# Patient Record
Sex: Female | Born: 1950
Health system: Southern US, Community
[De-identification: ages and names within clinical notes are randomized; demographics above are authoritative.]

## PROBLEM LIST (undated history)

## (undated) DIAGNOSIS — F419 Anxiety disorder, unspecified: Secondary | ICD-10-CM

## (undated) DIAGNOSIS — Z800 Family history of malignant neoplasm of digestive organs: Secondary | ICD-10-CM

## (undated) DIAGNOSIS — C801 Malignant (primary) neoplasm, unspecified: Secondary | ICD-10-CM

## (undated) DIAGNOSIS — Z8042 Family history of malignant neoplasm of prostate: Secondary | ICD-10-CM

## (undated) HISTORY — DX: Family history of malignant neoplasm of digestive organs: Z80.0

## (undated) HISTORY — DX: Family history of malignant neoplasm of prostate: Z80.42

## (undated) HISTORY — DX: Anxiety disorder, unspecified: F41.9

---

## 1998-04-16 ENCOUNTER — Emergency Department (HOSPITAL_COMMUNITY): Admission: EM | Admit: 1998-04-16 | Discharge: 1998-04-16 | Payer: Self-pay | Admitting: Emergency Medicine

## 1998-05-03 ENCOUNTER — Encounter: Admission: RE | Admit: 1998-05-03 | Discharge: 1998-08-01 | Payer: Self-pay | Admitting: Orthopaedic Surgery

## 2016-02-04 DIAGNOSIS — Z79891 Long term (current) use of opiate analgesic: Secondary | ICD-10-CM | POA: Diagnosis not present

## 2016-02-04 DIAGNOSIS — Z0000 Encounter for general adult medical examination without abnormal findings: Secondary | ICD-10-CM | POA: Diagnosis not present

## 2016-02-04 DIAGNOSIS — L259 Unspecified contact dermatitis, unspecified cause: Secondary | ICD-10-CM | POA: Diagnosis not present

## 2016-02-04 DIAGNOSIS — M797 Fibromyalgia: Secondary | ICD-10-CM | POA: Diagnosis not present

## 2016-02-04 DIAGNOSIS — F17200 Nicotine dependence, unspecified, uncomplicated: Secondary | ICD-10-CM | POA: Diagnosis not present

## 2016-11-29 DIAGNOSIS — Z79891 Long term (current) use of opiate analgesic: Secondary | ICD-10-CM | POA: Diagnosis not present

## 2017-03-01 DIAGNOSIS — Z79891 Long term (current) use of opiate analgesic: Secondary | ICD-10-CM | POA: Diagnosis not present

## 2017-04-11 DIAGNOSIS — Z79891 Long term (current) use of opiate analgesic: Secondary | ICD-10-CM | POA: Diagnosis not present

## 2017-05-10 ENCOUNTER — Encounter: Payer: Self-pay | Admitting: Gastroenterology

## 2017-05-17 DIAGNOSIS — R8290 Unspecified abnormal findings in urine: Secondary | ICD-10-CM | POA: Diagnosis not present

## 2017-05-17 DIAGNOSIS — R634 Abnormal weight loss: Secondary | ICD-10-CM | POA: Diagnosis not present

## 2017-05-17 DIAGNOSIS — R197 Diarrhea, unspecified: Secondary | ICD-10-CM | POA: Diagnosis not present

## 2017-05-21 DIAGNOSIS — R197 Diarrhea, unspecified: Secondary | ICD-10-CM | POA: Diagnosis not present

## 2017-05-21 DIAGNOSIS — R634 Abnormal weight loss: Secondary | ICD-10-CM | POA: Diagnosis not present

## 2017-05-21 DIAGNOSIS — R194 Change in bowel habit: Secondary | ICD-10-CM | POA: Diagnosis not present

## 2017-05-28 DIAGNOSIS — K2930 Chronic superficial gastritis without bleeding: Secondary | ICD-10-CM | POA: Diagnosis not present

## 2017-05-28 DIAGNOSIS — D126 Benign neoplasm of colon, unspecified: Secondary | ICD-10-CM | POA: Diagnosis not present

## 2017-05-28 DIAGNOSIS — K210 Gastro-esophageal reflux disease with esophagitis: Secondary | ICD-10-CM | POA: Diagnosis not present

## 2017-05-28 DIAGNOSIS — K5730 Diverticulosis of large intestine without perforation or abscess without bleeding: Secondary | ICD-10-CM | POA: Diagnosis not present

## 2017-05-28 DIAGNOSIS — K635 Polyp of colon: Secondary | ICD-10-CM | POA: Diagnosis not present

## 2017-05-28 DIAGNOSIS — K228 Other specified diseases of esophagus: Secondary | ICD-10-CM | POA: Diagnosis not present

## 2017-05-28 DIAGNOSIS — K640 First degree hemorrhoids: Secondary | ICD-10-CM | POA: Diagnosis not present

## 2017-05-28 DIAGNOSIS — R634 Abnormal weight loss: Secondary | ICD-10-CM | POA: Diagnosis not present

## 2017-05-28 DIAGNOSIS — R197 Diarrhea, unspecified: Secondary | ICD-10-CM | POA: Diagnosis not present

## 2017-05-28 DIAGNOSIS — R194 Change in bowel habit: Secondary | ICD-10-CM | POA: Diagnosis not present

## 2017-05-28 DIAGNOSIS — K2900 Acute gastritis without bleeding: Secondary | ICD-10-CM | POA: Diagnosis not present

## 2017-06-04 DIAGNOSIS — D126 Benign neoplasm of colon, unspecified: Secondary | ICD-10-CM | POA: Diagnosis not present

## 2017-06-04 DIAGNOSIS — K210 Gastro-esophageal reflux disease with esophagitis: Secondary | ICD-10-CM | POA: Diagnosis not present

## 2017-06-04 DIAGNOSIS — K635 Polyp of colon: Secondary | ICD-10-CM | POA: Diagnosis not present

## 2017-06-15 ENCOUNTER — Other Ambulatory Visit: Payer: Self-pay | Admitting: Gastroenterology

## 2017-06-15 DIAGNOSIS — R197 Diarrhea, unspecified: Secondary | ICD-10-CM

## 2017-06-15 DIAGNOSIS — R634 Abnormal weight loss: Secondary | ICD-10-CM

## 2017-06-21 ENCOUNTER — Ambulatory Visit
Admission: RE | Admit: 2017-06-21 | Discharge: 2017-06-21 | Disposition: A | Discharge status: Home / Self Care | Payer: Medicare Other | Source: Ambulatory Visit | Attending: Gastroenterology | Admitting: Gastroenterology

## 2017-06-21 DIAGNOSIS — R197 Diarrhea, unspecified: Secondary | ICD-10-CM

## 2017-06-21 DIAGNOSIS — J439 Emphysema, unspecified: Secondary | ICD-10-CM | POA: Diagnosis not present

## 2017-06-21 DIAGNOSIS — K7689 Other specified diseases of liver: Secondary | ICD-10-CM | POA: Diagnosis not present

## 2017-06-21 DIAGNOSIS — R634 Abnormal weight loss: Secondary | ICD-10-CM

## 2017-06-21 MED ORDER — IOPAMIDOL (ISOVUE-300) INJECTION 61%
100.0000 mL | Freq: Once | INTRAVENOUS | Status: AC | PRN
  Administered 2017-06-21: 100 mL via INTRAVENOUS

## 2017-06-26 DIAGNOSIS — C259 Malignant neoplasm of pancreas, unspecified: Secondary | ICD-10-CM | POA: Diagnosis not present

## 2017-06-26 DIAGNOSIS — R634 Abnormal weight loss: Secondary | ICD-10-CM | POA: Diagnosis not present

## 2017-06-26 DIAGNOSIS — R197 Diarrhea, unspecified: Secondary | ICD-10-CM | POA: Diagnosis not present

## 2017-06-26 DIAGNOSIS — R1013 Epigastric pain: Secondary | ICD-10-CM | POA: Diagnosis not present

## 2017-06-27 ENCOUNTER — Other Ambulatory Visit (HOSPITAL_COMMUNITY): Payer: Self-pay | Admitting: Gastroenterology

## 2017-06-27 DIAGNOSIS — K769 Liver disease, unspecified: Secondary | ICD-10-CM

## 2017-07-02 ENCOUNTER — Ambulatory Visit: Payer: Self-pay | Admitting: Gastroenterology

## 2017-07-03 ENCOUNTER — Encounter: Payer: Self-pay | Admitting: Hematology

## 2017-07-03 ENCOUNTER — Ambulatory Visit (HOSPITAL_BASED_OUTPATIENT_CLINIC_OR_DEPARTMENT_OTHER): Payer: Medicare Other | Admitting: Hematology

## 2017-07-03 DIAGNOSIS — C787 Secondary malignant neoplasm of liver and intrahepatic bile duct: Secondary | ICD-10-CM | POA: Diagnosis not present

## 2017-07-03 DIAGNOSIS — C259 Malignant neoplasm of pancreas, unspecified: Secondary | ICD-10-CM | POA: Diagnosis not present

## 2017-07-03 DIAGNOSIS — R197 Diarrhea, unspecified: Secondary | ICD-10-CM

## 2017-07-03 DIAGNOSIS — K8689 Other specified diseases of pancreas: Secondary | ICD-10-CM

## 2017-07-03 MED ORDER — PANCRELIPASE (LIP-PROT-AMYL) 24000-76000 UNITS PO CPEP: 1.0000 | ORAL_CAPSULE | Freq: Three times a day (TID) | ORAL | 2 refills | Status: DC

## 2017-07-03 NOTE — Progress Notes (Signed)
[place] [place]  [phone] Fax:([phone]) [place] Note   Patient Care Team: Wilford Corner, MD as PCP - General (Gastroenterology) 07/03/2017  REFERRAL PHYSICIAN: Dr. 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 female is here because of her abnormal CT findings which is highly suspicious for metastatic pancreatic malignancy.  She was referred by her gastroenterologist Dr. Michail Sermon.  She presents to my clinic with her husband today.  She has been having diarrhea for 4 months, she has loose/watery BM after each meals, no pain, nausea, or bloating. She has had low appetite since then, she has been eating very little, she was also recommended to avoid dairy products by her primary care physician, per her husband, she has been taking boost 3 bottles a day lately, she has lost aobut 35 lbs, but has been stable lately since she is taking boosts.   She was initially referred by her primary care physician, and subsequently referred to GI Dr. Michail Sermon.  She underwent EGD and colonoscopy, which showed benign polyps and colon otherwise negative.,  CT chest, abdomen and pelvis was obtained on June 21, 2017, which showed a indistinct low-attenuation mass in the uncinate process of the pancreas with dilatation of pancreatic duct multiple, low-attenuation lesions throughout the right and left lobe of liver most consistent with liver metastasis.  She was referred to IR for liver biopsy this Friday.  She is otherwise healthy, retired from post office, still works part-time as a Oceanographer.  She lives with her husband, who is disabled.  She overall has mild fatigue, but functions very well at home.  No other complaints.  MEDICAL HISTORY:  Past Medical History:  Diagnosis Date  . Anxiety     SURGICAL HISTORY: History  reviewed. No pertinent surgical history.  SOCIAL HISTORY: Social History   Socioeconomic History  . Marital status: Married    Spouse name: Not on file  . Number of children: Not on file  . Years of education: Not on file  . Highest education level: Not on file  Social Needs  . Financial resource strain: Not on file  . Food insecurity - worry: Not on file  . Food insecurity - inability: Not on file  . Transportation needs - medical: Not on file  . Transportation needs - non-medical: Not on file  Occupational History  . Not on file  Tobacco Use  . Smoking status: Current Every Day Smoker    Packs/day: 0.50    Years: 50.00    Pack years: 25.00  . Smokeless tobacco: Never Used  Substance and Sexual Activity  . Alcohol use: No    Frequency: Never  . Drug use: Yes    Types: Marijuana  . Sexual activity: Not on file  Other Topics Concern  . Not on file  Social History Narrative  . Not on file    FAMILY HISTORY: Family History  Problem Relation Age of Onset  . Cancer Father        prostate cancer   . Cancer Maternal Aunt 70       pancreatic cancer   . Cancer Maternal Grandmother        colon cancer     ALLERGIES:  has No Known Allergies.  MEDICATIONS:  Current Outpatient Medications  Medication Sig Dispense Refill  . LORazepam (ATIVAN) 1 MG tablet Take 1 mg 2 (two) times  daily by mouth.    . Multiple Vitamin (MULTIVITAMIN WITH MINERALS) TABS tablet Take 1 tablet daily by mouth. One-A-Day for Women     No current facility-administered medications for this visit.     REVIEW OF SYSTEMS:   Constitutional: Denies fevers, chills or abnormal night sweats, (+) 35 pound weight loss in the past 4 months Eyes: Denies blurriness of vision, double vision or watery eyes Ears, nose, mouth, throat, and face: Denies mucositis or sore throat Respiratory: Denies cough, dyspnea or wheezes Cardiovascular: Denies palpitation, chest discomfort or lower extremity  swelling Gastrointestinal:  Denies nausea, heartburn, (+) diarrhea  Skin: Denies abnormal skin rashes Lymphatics: Denies new lymphadenopathy or easy bruising Neurological:Denies numbness, tingling or new weaknesses Behavioral/Psych: Mood is stable, no new changes  All other systems were reviewed with the patient and are negative.  PHYSICAL EXAMINATION: ECOG PERFORMANCE STATUS: 1 - Symptomatic but completely ambulatory  Vitals:   07/03/17 1342  BP: 104/63  Pulse: 69  Resp: 18  Temp: (!) 97.5 F (36.4 C)  SpO2: 100%   Filed Weights   07/03/17 1342  Weight: 93 lb 12.8 oz (42.5 kg)    GENERAL:alert, no distress and comfortable SKIN: skin color, texture, turgor are normal, no rashes or significant lesions EYES: normal, conjunctiva are pink and non-injected, sclera clear OROPHARYNX:no exudate, no erythema and lips, buccal mucosa, and tongue normal  NECK: supple, thyroid normal size, non-tender, without nodularity LYMPH:  no palpable lymphadenopathy in the cervical, axillary or inguinal LUNGS: clear to auscultation and percussion with normal breathing effort HEART: regular rate & rhythm and no murmurs and no lower extremity edema ABDOMEN:abdomen soft, non-tender and normal bowel sounds Musculoskeletal:no cyanosis of digits and no clubbing  PSYCH: alert & oriented x 3 with fluent speech NEURO: no focal motor/sensory deficits  LABORATORY DATA:  I have reviewed the data as listed No flowsheet data found.  Her outside lab from June 26, 2017 CBC: WBC 8.7, hemoglobin 13.5, MCV 93.9, platelet 221K CMP: Within normal limits except glucose 114, potassium 3.4.  Creatinine 0.77, total bilirubin 0.6, AST 16, ALT 12 CA19.9: 1   PATHOLOGY: #1 biopsy from duodenum, distal stomach, GE junction no significant pathological changes, except reactive changes in stomach #2 transverse colon, hepatic flexure, sigmoid, polyps biopsy showed tubular adenomas (3)  RADIOGRAPHIC STUDIES: I have  personally reviewed the radiological images as listed and agreed with the findings in the report. Ct Chest W Contrast  Result Date: 06/21/2017 CLINICAL DATA:  Weight loss of 35 pounds over 6 months, loss of appetite, diarrhea for several months EXAM: CT CHEST, ABDOMEN, AND PELVIS WITH CONTRAST TECHNIQUE: Multidetector CT imaging of the chest, abdomen and pelvis was performed following the standard protocol during bolus administration of intravenous contrast. CONTRAST:  192mL ISOVUE-300 IOPAMIDOL (ISOVUE-300) INJECTION 61% COMPARISON:  Chest x-ray of 05/17/2017 Creatinine was obtained on site at [facility] at [address] Results: Creatinine 0.9 mg/dL.  GFR of 88. FINDINGS: CT CHEST FINDINGS Cardiovascular: No significant coronary artery calcifications are seen. The heart is mildly enlarged. No pericardial effusion is noted. The mid ascending thoracic aorta measures 29 mm in diameter. The pulmonary arteries are faintly visualized proximally with no significant abnormality centrally. Mediastinum/Nodes: No mediastinal or hilar adenopathy is noted. The thyroid gland contains only small low-attenuation nodules of questionable significance. Lungs/Pleura: On lung window images there are changes of centrilobular and paraseptal emphysema noted. No suspicious lung nodule is seen. No parenchymal infiltrate is seen and there is no evidence of pleural  effusion. The central airway is patent. Musculoskeletal: The thoracic vertebrae are in normal alignment with only mild degenerative change in the lower thoracic spine. CT ABDOMEN PELVIS FINDINGS Hepatobiliary: There are multiple rounded low-attenuation structures scattered throughout both the right and left lobes of liver. The multitude these lesions and varying sizes is very worrisome for diffuse metastatic involvement of the liver. MRI is recommended to assess further. No intrahepatic ductal dilatation is seen. Pancreas: There is a low-attenuation lesion  involving the uncinate process of the pancreas, and the pancreatic duct is significantly dilated distally. This lesion encases the superior mesenteric artery which is patent. Spleen: The spleen is unremarkable. Adrenals/Urinary Tract: The adrenal glands are symmetrical and normal. The kidneys enhance and there are bilateral renal cysts present right greater than left. No calculi are seen and there is no evidence of solid renal mass. No hydronephrosis is noted. On delayed images, the pelvocaliceal systems are unremarkable. The ureters are normal in caliber. The urinary bladder is not well distended but no gross abnormality is seen. Stomach/Bowel: The stomach is largely decompressed. No small bowel distention is seen. There is feces throughout the colon but no colonic lesion is noted. The terminal ileum and appendix are unremarkable. Vascular/Lymphatic: No abdominal aortic aneurysm is seen. There is moderate change of abdominal aortic atherosclerosis noted. No definite adenopathy is seen. Reproductive: The uterus is normal in size. No adnexal lesion is seen. there may be a tiny amount fluid within the pelvis. Other: None Musculoskeletal: The lumbar vertebrae are in normal alignment. No compression deformity is seen. IMPRESSION: 1. Indistinct low-attenuation mass in the uncinate process of the pancreas with dilatation of remainder of the pancreatic duct and some encasement of the SMA. These findings are highly indicative of pancreatic carcinoma. Recommend MRI to assess further. 2. Multiple low-attenuation lesions throughout the right and left lobes of liver most consistent with liver metastasis again MRI may be helpful. 3. Moderate change of abdominal aortic atherosclerosis. 4. Changes of centrilobular and paraseptal emphysema on CT of the chest. No lung lesion is seen. Electronically Signed   By: Ivar Drape M.D.   On: 06/21/2017 10:15   Ct Abdomen Pelvis W Contrast  Result Date: 06/21/2017 CLINICAL DATA:  Weight  loss of 35 pounds over 6 months, loss of appetite, diarrhea for several months EXAM: CT CHEST, ABDOMEN, AND PELVIS WITH CONTRAST TECHNIQUE: Multidetector CT imaging of the chest, abdomen and pelvis was performed following the standard protocol during bolus administration of intravenous contrast. CONTRAST:  176mL ISOVUE-300 IOPAMIDOL (ISOVUE-300) INJECTION 61% COMPARISON:  Chest x-ray of 05/17/2017 Creatinine was obtained on site at [facility] at [address] Results: Creatinine 0.9 mg/dL.  GFR of 88. FINDINGS: CT CHEST FINDINGS Cardiovascular: No significant coronary artery calcifications are seen. The heart is mildly enlarged. No pericardial effusion is noted. The mid ascending thoracic aorta measures 29 mm in diameter. The pulmonary arteries are faintly visualized proximally with no significant abnormality centrally. Mediastinum/Nodes: No mediastinal or hilar adenopathy is noted. The thyroid gland contains only small low-attenuation nodules of questionable significance. Lungs/Pleura: On lung window images there are changes of centrilobular and paraseptal emphysema noted. No suspicious lung nodule is seen. No parenchymal infiltrate is seen and there is no evidence of pleural effusion. The central airway is patent. Musculoskeletal: The thoracic vertebrae are in normal alignment with only mild degenerative change in the lower thoracic spine. CT ABDOMEN PELVIS FINDINGS Hepatobiliary: There are multiple rounded low-attenuation structures scattered throughout both the right and left lobes of  liver. The multitude these lesions and varying sizes is very worrisome for diffuse metastatic involvement of the liver. MRI is recommended to assess further. No intrahepatic ductal dilatation is seen. Pancreas: There is a low-attenuation lesion involving the uncinate process of the pancreas, and the pancreatic duct is significantly dilated distally. This lesion encases the superior mesenteric artery which is patent.  Spleen: The spleen is unremarkable. Adrenals/Urinary Tract: The adrenal glands are symmetrical and normal. The kidneys enhance and there are bilateral renal cysts present right greater than left. No calculi are seen and there is no evidence of solid renal mass. No hydronephrosis is noted. On delayed images, the pelvocaliceal systems are unremarkable. The ureters are normal in caliber. The urinary bladder is not well distended but no gross abnormality is seen. Stomach/Bowel: The stomach is largely decompressed. No small bowel distention is seen. There is feces throughout the colon but no colonic lesion is noted. The terminal ileum and appendix are unremarkable. Vascular/Lymphatic: No abdominal aortic aneurysm is seen. There is moderate change of abdominal aortic atherosclerosis noted. No definite adenopathy is seen. Reproductive: The uterus is normal in size. No adnexal lesion is seen. there may be a tiny amount fluid within the pelvis. Other: None Musculoskeletal: The lumbar vertebrae are in normal alignment. No compression deformity is seen. IMPRESSION: 1. Indistinct low-attenuation mass in the uncinate process of the pancreas with dilatation of remainder of the pancreatic duct and some encasement of the SMA. These findings are highly indicative of pancreatic carcinoma. Recommend MRI to assess further. 2. Multiple low-attenuation lesions throughout the right and left lobes of liver most consistent with liver metastasis again MRI may be helpful. 3. Moderate change of abdominal aortic atherosclerosis. 4. Changes of centrilobular and paraseptal emphysema on CT of the chest. No lung lesion is seen. Electronically Signed   By: Ivar Drape M.D.   On: 06/21/2017 10:15   EGD: 05/28/2017: Impression:  -Normal esophagus- -Z line irregular, 40 cm from the incisors, biopsied. -Acute gastritis, biopsied, -Normal examined duodenum, biopsied.  Colonoscopy: March 28, 2017 Impression -Multiple polyps, one 7 mm at the  hepatic flexure, one 3 mm at flexure, one 5 mm in the transverse colon, one 4 mm in the sigmoid colon, one 14 mm in the sigmoid colon, all removed -Normal mucosa in the entire examined colon -Diverticulosis in the sigmoid colon -Internal hemorrhoids   ASSESSMENT & PLAN: 66 year old African-American female, with past medical history of anxiety, otherwise healthy, presented with chronic diarrhea for 4 months, poor appetite and 35lbs weight loss.  CT scan showed a pancreatic head mass and multiple liver metastasis.  1. 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 she has diffuse liver metastases in both lobes. -He is scheduled for liver biopsy by interventional radiology later this week -From the image findings, and statistically, paraparetic adenocarcinoma is likely however her CA 19.9 was.  Normal, she does not have chronic diarrhea, no flushing, metastatic pancreatic neuroendocrine tumor is also possible.  We reviewed the natural history of this metastatic pancreatic malignancy and treatment options. -We discussed her malignancy is not curable at this stage, but treatable. -I plan to see her back next week to discuss her biopsy findings, and finalize her treatment plan. -She had many questions, I answered to her satisfaction's.  2. Diarrhea  -Probable  related to her pancreatic malignancy -I will call in creon to her pharmacy   3.  Anorexia and weight loss -She has been drinking boost 3 bottles a day, which has been stabilized lately -She has used marijuana in the past, still has some at home, she will try marijuana for her anorexia.  Plan  -She is scheduled for liver biopsy by interventional radiology later this week -She is agreeable to have a port placement  if the biopsy shows adenocarcinoma. Will schedule through IR -I will see her next week to discuss her biopsy results and finalize her treatment plan. -Per patient's request, I will call her brother next week when we have the diagnosis.  No orders of the defined types were placed in this encounter.   All questions were answered. The patient knows to call the clinic with any problems, questions or concerns. I spent 55 minutes counseling the patient face to face. The total time spent in the appointment was 60 minutes and more than 50% was on counseling.     Truitt Merle, MD 07/03/2017 3:23 PM

## 2017-07-04 ENCOUNTER — Telehealth: Payer: Self-pay | Admitting: Hematology

## 2017-07-04 NOTE — Progress Notes (Signed)
  Oncology Nurse Navigator Documentation  Navigator Location: [facility] (07/04/17 1308) Referral date to RadOnc/MedOnc: 06/27/17 (07/04/17 0941) )Navigator Encounter Type: Introductory phone call (07/04/17 0941)   Abnormal Finding Date: 06/21/17 (07/04/17 0941)                   Treatment Phase: Abnormal Scans (07/04/17 0941) Barriers/Navigation Needs: Education (07/04/17 0941) Education: Understanding Cancer/ Treatment Options;Coping with Diagnosis/ Prognosis;Newly Diagnosed Cancer Education (07/04/17 6578) Interventions: Education;Psycho-social support (07/04/17 0941)  I called patient to introduce myself and my role as GI navigator. Patient was unavailable but Tammy Adams and I spoke at length about what was discussed at their appointment with Dr. Burr Medico on 07/03/17. Patient shared that it has been overwhelming to hear the news of how serious his wife's diagnosis is. Patient asked if his wife could be "cured". I reinforced what Dr. Burr Medico had documented in her notes about treatable but not curable. Tammy Adams shared that he wanted to start "looking things up on the Internet." I let patient know that I will have a packet of information pertinent to his wife's treatment'diagnosis for them at his next appointment and that they can call me at any time with questions or concerns.     Education Method: Verbal (07/04/17 4696)      Acuity: Level 1 (07/04/17 0941) Acuity Level 1: Initial guidance, education and coordination as needed (07/04/17 0941)       Time Spent with Patient: 30 (07/04/17 0941)

## 2017-07-04 NOTE — Telephone Encounter (Signed)
Spoke with spouse re 11/21 f/u.

## 2017-07-04 NOTE — Progress Notes (Signed)
  Oncology Nurse Navigator Documentation  Navigator Location: [facility] (07/04/17 1528)   )Navigator Encounter Type: Telephone (07/04/17 1528) Telephone: Outgoing Call (07/04/17 1528)                           Interventions: Coordination of Care (07/04/17 1528)   Coordination of Care: Appts (07/04/17 1528)    I called and spoke directly with patient to introduce myself and my role as the Gi Navigator. I reviewed patient's upcoming appointments and let her know that her F/U with Dr. Burr Medico is scheduled for 07/11/17 @ 3:30 PM.     Acuity Level 1: Initial guidance, education and coordination as needed (07/04/17 1528)       Time Spent with Patient: 15 (07/04/17 1528)

## 2017-07-05 ENCOUNTER — Other Ambulatory Visit: Payer: Self-pay | Admitting: Radiology

## 2017-07-05 ENCOUNTER — Other Ambulatory Visit: Payer: Self-pay | Admitting: Student

## 2017-07-05 NOTE — Progress Notes (Signed)
  Oncology Nurse Navigator Documentation  Navigator Location: [facility] (07/05/17 1405)   )Navigator Encounter Type: Telephone (07/05/17 1405) Telephone: Incoming Call (07/05/17 1405)                         Education: Other(Understanding medication administration) (07/05/17 1405) Interventions: Education (07/05/17 1405)  Patient called to ask how she should take her Creon. Patient instructed to speak with her pharmacist when she picks up the Creon and if she has further questions she can call back and we can find further answers.  I also spoke with Mr. Schwenke to assure him that we are only a phone call away if he has questions.          Acuity: Level 2 (07/05/17 1405)   Acuity Level 2: Ongoing guidance and education throughout treatment as needed;Educational needs (07/05/17 1405)     Time Spent with Patient: 15 (07/05/17 1405)

## 2017-07-06 ENCOUNTER — Ambulatory Visit (HOSPITAL_COMMUNITY)
Admission: RE | Admit: 2017-07-06 | Discharge: 2017-07-06 | Disposition: A | Discharge status: Home / Self Care | Payer: Medicare Other | Source: Ambulatory Visit | Attending: Gastroenterology | Admitting: Gastroenterology

## 2017-07-06 ENCOUNTER — Other Ambulatory Visit (HOSPITAL_COMMUNITY)
Admission: RE | Admit: 2017-07-06 | Discharge: 2017-07-06 | Disposition: A | Discharge status: Home / Self Care | Payer: Medicare Other | Source: Ambulatory Visit | Attending: Hematology | Admitting: Hematology

## 2017-07-06 ENCOUNTER — Encounter (HOSPITAL_COMMUNITY): Payer: Self-pay

## 2017-07-06 DIAGNOSIS — K769 Liver disease, unspecified: Secondary | ICD-10-CM | POA: Insufficient documentation

## 2017-07-06 DIAGNOSIS — F17210 Nicotine dependence, cigarettes, uncomplicated: Secondary | ICD-10-CM | POA: Insufficient documentation

## 2017-07-06 DIAGNOSIS — N281 Cyst of kidney, acquired: Secondary | ICD-10-CM | POA: Diagnosis not present

## 2017-07-06 DIAGNOSIS — K7689 Other specified diseases of liver: Secondary | ICD-10-CM | POA: Diagnosis not present

## 2017-07-06 DIAGNOSIS — C787 Secondary malignant neoplasm of liver and intrahepatic bile duct: Secondary | ICD-10-CM | POA: Insufficient documentation

## 2017-07-06 DIAGNOSIS — I700 Atherosclerosis of aorta: Secondary | ICD-10-CM | POA: Insufficient documentation

## 2017-07-06 DIAGNOSIS — Z79899 Other long term (current) drug therapy: Secondary | ICD-10-CM | POA: Diagnosis not present

## 2017-07-06 DIAGNOSIS — R634 Abnormal weight loss: Secondary | ICD-10-CM | POA: Insufficient documentation

## 2017-07-06 DIAGNOSIS — F419 Anxiety disorder, unspecified: Secondary | ICD-10-CM | POA: Insufficient documentation

## 2017-07-06 DIAGNOSIS — C801 Malignant (primary) neoplasm, unspecified: Secondary | ICD-10-CM | POA: Diagnosis not present

## 2017-07-06 DIAGNOSIS — J432 Centrilobular emphysema: Secondary | ICD-10-CM | POA: Insufficient documentation

## 2017-07-06 DIAGNOSIS — C259 Malignant neoplasm of pancreas, unspecified: Secondary | ICD-10-CM | POA: Insufficient documentation

## 2017-07-06 LAB — APTT: APTT: 27 s (ref 24–36)

## 2017-07-06 LAB — CBC
HEMATOCRIT: 37.4 % (ref 36.0–46.0)
HEMOGLOBIN: 12.9 g/dL (ref 12.0–15.0)
MCH: 31.8 pg (ref 26.0–34.0)
MCHC: 34.5 g/dL (ref 30.0–36.0)
MCV: 92.1 fL (ref 78.0–100.0)
Platelets: 204 10*3/uL (ref 150–400)
RBC: 4.06 MIL/uL (ref 3.87–5.11)
RDW: 13.4 % (ref 11.5–15.5)
WBC: 8.3 10*3/uL (ref 4.0–10.5)

## 2017-07-06 LAB — PROTIME-INR
INR: 1.05
Prothrombin Time: 13.6 seconds (ref 11.4–15.2)

## 2017-07-06 MED ORDER — GELATIN ABSORBABLE 12-7 MM EX MISC
CUTANEOUS | Status: AC
  Filled 2017-07-06: qty 1

## 2017-07-06 MED ORDER — SODIUM CHLORIDE 0.9 % IV SOLN: INTRAVENOUS | Status: DC

## 2017-07-06 MED ORDER — MIDAZOLAM HCL 2 MG/2ML IJ SOLN
INTRAMUSCULAR | Status: AC
  Filled 2017-07-06: qty 2

## 2017-07-06 MED ORDER — FENTANYL CITRATE (PF) 100 MCG/2ML IJ SOLN
INTRAMUSCULAR | Status: AC | PRN
  Administered 2017-07-06 (×2): 25 ug via INTRAVENOUS

## 2017-07-06 MED ORDER — FENTANYL CITRATE (PF) 100 MCG/2ML IJ SOLN
INTRAMUSCULAR | Status: AC
  Filled 2017-07-06: qty 2

## 2017-07-06 MED ORDER — LIDOCAINE-EPINEPHRINE 1 %-1:100000 IJ SOLN
INTRAMUSCULAR | Status: AC
  Filled 2017-07-06: qty 1

## 2017-07-06 MED ORDER — MIDAZOLAM HCL 2 MG/2ML IJ SOLN
INTRAMUSCULAR | Status: AC | PRN
  Administered 2017-07-06: 1 mg via INTRAVENOUS

## 2017-07-06 NOTE — Discharge Instructions (Signed)
Liver Biopsy °The liver is a large organ in the upper right-hand side of your abdomen. A liver biopsy is a procedure in which a tissue sample is taken from the liver and examined under a microscope. The procedure is done to confirm a suspected problem. °There are three types of liver biopsies: °· Percutaneous. In this type, an incision is made in your abdomen. The sample is removed through the incision with a needle. °· Laparoscopic. In this type, several incisions are made in the abdomen. A tiny camera is passed through one of the incisions to help guide the health care provider. The sample is removed through the other incision or incisions. °· Transjugular. In this type, an incision is made in the neck. A tube is passed through the incision to the liver. The sample is removed through the tube with a needle. ° °Tell a health care provider about: °· Any allergies you have. °· All medicines you are taking, including vitamins, herbs, eye drops, creams, and over-the-counter medicines. °· Any problems you or family members have had with anesthetic medicines. °· Any blood disorders you have. °· Any surgeries you have had. °· Any medical conditions you have. °· Possibility of pregnancy, if this applies. °What are the risks? °Generally, this is a safe procedure. However, problems can occur and include: °· Bleeding. °· Infection. °· Bruising. °· Collapsed lung. °· Leak of digestive juices (bile) from the liver or gallbladder. °· Problems with heart rhythm. °· Pain at the biopsy site or in the right shoulder. °· Low blood pressure (hypotension). °· Injury to nearby organs or tissues. ° °What happens before the procedure? °· Your health care provider may do some blood or urine tests. These will help your health care provider learn how well your kidneys and liver are working and how well your blood clots. °· Ask your health care provider if you will be able to go home the day of the procedure. Arrange for someone to take you  home and stay with you for at least 24 hours. °· Do not eat or drink anything after midnight on the night before the procedure or as directed by your health care provider. °· Ask your health care provider about: °? Changing or stopping your regular medicines. This is especially important if you are taking diabetes medicines or blood thinners. °? Taking medicines such as aspirin and ibuprofen. These medicines can thin your blood. Do not take these medicines before your procedure if your health care provider asks you not to. °What happens during the procedure? °Regardless of the type of biopsy that will be done, you will have an IV line placed. Through this line, you will receive fluids and medicine to relax you. If you will be having a laparoscopic biopsy, you may also receive medicine through this line to make you sleep during the procedure (general anesthetic). °Percutaneous Liver Biopsy °· You will positioned on your back, with your right hand over your head. °· A health care provider will locate your liver by tapping and pressing on the right side of your abdomen or with the help of an ultrasound machine or CT scan. °· An area at the bottom of your last right rib will be numbed. °· An incision will be made in the numbed area. °· The biopsy needle will be inserted into the incision. °· Several samples of liver tissue will be taken with the biopsy needle. You will be asked to hold your breath as each sample is taken. °Laparoscopic Liver   Biopsy °· You will be positioned on your back. °· Several small incisions will be made in your abdomen. °· Your doctor will pass a tiny camera through one incision. The camera will allow the liver to be viewed on a TV monitor in the operating room. °· Tools will be passed through the other incision or incisions. These tools will be used to remove samples of liver tissue. °Transjugular Liver Biopsy °· You will be positioned on your back on an X-ray table, with your head turned to  your left. °· An area on your neck just over your jugular vein will be numbed. °· An incision will be made in the numbed area. °· A tiny tube will be inserted through the incision. It will be pushed through the jugular vein to a blood vessel in the liver called the hepatic vein. °· Dye will be inserted through the tube, and X-rays will be taken. The dye will make the blood vessels in the liver light up on the X-rays. °· The biopsy needle will be pushed through the tube until it reaches the liver. °· Samples of liver tissue will be taken with the biopsy needle. °· The needle and the tube will be removed. °After the samples are obtained, the incision or incisions will be closed. °What happens after the procedure? °· You will be taken to a recovery area. °· You may have to lie on your right side for 1-2 hours. This will prevent bleeding from the biopsy site. °· Your progress will be watched. Your blood pressure, pulse, and the biopsy site will be checked often. °· You may have some pain or feel sick. If this happens, tell your health care provider. °· As you begin to feel better, you will be offered ice and beverages. °· You may be allowed to go home when the medicines have worn off and you can walk, drink, eat, and use the bathroom. °This information is not intended to replace advice given to you by your health care provider. Make sure you discuss any questions you have with your health care provider. °Document Released: 10/28/2003 Document Revised: 01/10/2016 Document Reviewed: 10/03/2013 °Elsevier Interactive Patient Education © 2018 [employer] ° °

## 2017-07-06 NOTE — Sedation Documentation (Signed)
Patient is resting comfortably. 

## 2017-07-06 NOTE — Procedures (Signed)
Pre Procedure Dx: Liver lesions, worrisome for pancreatic metastasis. Post Procedural Dx: Same  Technically successful US guided biopsy of indeterminate lesion within the right lobe of the liver.  EBL: None  No immediate complications.   Ronny Bacon, MD Pager #: [phone]

## 2017-07-06 NOTE — Sedation Documentation (Signed)
Bandaid R flank intact  

## 2017-07-06 NOTE — H&P (Signed)
Chief Complaint: Patient was seen in consultation today for liver lesion biopsy at the request of Schooler,Vincent  Referring Physician(s): Schooler,Vincent  Supervising Physician: Sandi Mariscal  Patient Status: [facility] - Out-pt  History of Present Illness: Cleotha Whalin is a 66 y.o. female   abd pain; bloating; diarrhea Wt loss GI evaluation neg CT 06/21/17: IMPRESSION: 1. Indistinct low-attenuation mass in the uncinate process of the pancreas with dilatation of remainder of the pancreatic duct and some encasement of the SMA. These findings are highly indicative of pancreatic carcinoma. Recommend MRI to assess further. 2. Multiple low-attenuation lesions throughout the right and left lobes of liver most consistent with liver metastasis again MRI may be helpful. 3. Moderate change of abdominal aortic atherosclerosis. 4. Changes of centrilobular and paraseptal emphysema on CT of the chest. No lung lesion is seen.  Referred to Oncology Dr Burr Medico note 07/03/17: 1. 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 she has diffuse liver metastases in both lobes. -He is scheduled for liver biopsy by interventional radiology later this week -From the image findings, and statistically, paraparetic adenocarcinoma is likely however her CA 19.9 was.  Normal, she does not have chronic diarrhea, no flushing, metastatic pancreatic neuroendocrine tumor is also possible.  We reviewed the natural history of this metastatic pancreatic malignancy and treatment options.  Scheduled now for liver lesion biopsy   Past Medical History:  Diagnosis Date  . Anxiety     History reviewed. No pertinent surgical history.  Allergies: Patient has no known  allergies.  Medications: Prior to Admission medications   Medication Sig Start Date End Date Taking? Authorizing Provider  LORazepam (ATIVAN) 1 MG tablet Take 1 mg 2 (two) times daily by mouth.   Yes [provider]  Multiple Vitamin (MULTIVITAMIN WITH MINERALS) TABS tablet Take 1 tablet daily by mouth. One-A-Day for Women   Yes [provider]  Pancrelipase, Lip-Prot-Amyl, (CREON) 24000-76000 units CPEP Take 1 capsule (24,000 Units total) 3 (three) times daily before meals by mouth. 07/03/17   Truitt Merle, MD     Family History  Problem Relation Age of Onset  . Cancer Father        prostate cancer   . Cancer Maternal Aunt 70       pancreatic cancer   . Cancer Maternal Grandmother        colon cancer     Social History   Socioeconomic History  . Marital status: Married    Spouse name: None  . Number of children: None  . Years of education: None  . Highest education level: None  Social Needs  . Financial resource strain: None  . Food insecurity - worry: None  . Food insecurity - inability: None  . Transportation needs - medical: None  . Transportation needs - non-medical: None  Occupational History  . None  Tobacco Use  . Smoking status: Current Every Day Smoker    Packs/day: 0.50    Years: 50.00    Pack years: 25.00  . Smokeless tobacco: Never Used  Substance and Sexual Activity  . Alcohol use: No    Frequency: Never  . Drug use: Yes    Types: Marijuana  . Sexual activity: None  Other Topics Concern  . None  Social History Narrative  . None  Review of Systems: A 12 point ROS discussed and pertinent positives are indicated in the HPI above.  All other systems are negative.  Review of Systems  Constitutional: Positive for activity change, appetite change, fatigue and unexpected weight change. Negative for fever.  HENT: Negative for trouble swallowing.   Respiratory: Negative for cough and shortness of breath.   Cardiovascular: Negative for  chest pain.  Gastrointestinal: Positive for abdominal distention, abdominal pain, diarrhea and nausea.  Neurological: Positive for weakness.  Psychiatric/Behavioral: Negative for behavioral problems and confusion.    Vital Signs: BP 123/79 (BP Location: Right Arm)   Pulse 100   Temp 97.9 F (36.6 C) (Oral)   Ht 5\' 4"  (1.626 m)   Wt 94 lb 3.2 oz (42.7 kg)   SpO2 100%   BMI 16.17 kg/m   Physical Exam  Constitutional: She is oriented to person, place, and time.  Cardiovascular: Normal rate, regular rhythm and normal heart sounds.  Pulmonary/Chest: Effort normal and breath sounds normal.  Abdominal: Soft. Bowel sounds are normal. There is tenderness.  Musculoskeletal: Normal range of motion.  Neurological: She is alert and oriented to person, place, and time.  Skin: Skin is warm and dry.  Psychiatric: She has a normal mood and affect. Her behavior is normal. Judgment and thought content normal.  Nursing note and vitals reviewed.   Imaging: Ct Chest W Contrast  Result Date: 06/21/2017 CLINICAL DATA:  Weight loss of 35 pounds over 6 months, loss of appetite, diarrhea for several months EXAM: CT CHEST, ABDOMEN, AND PELVIS WITH CONTRAST TECHNIQUE: Multidetector CT imaging of the chest, abdomen and pelvis was performed following the standard protocol during bolus administration of intravenous contrast. CONTRAST:  150mL ISOVUE-300 IOPAMIDOL (ISOVUE-300) INJECTION 61% COMPARISON:  Chest x-ray of 05/17/2017 Creatinine was obtained on site at [facility] at [address] Results: Creatinine 0.9 mg/dL.  GFR of 88. FINDINGS: CT CHEST FINDINGS Cardiovascular: No significant coronary artery calcifications are seen. The heart is mildly enlarged. No pericardial effusion is noted. The mid ascending thoracic aorta measures 29 mm in diameter. The pulmonary arteries are faintly visualized proximally with no significant abnormality centrally. Mediastinum/Nodes: No mediastinal or hilar  adenopathy is noted. The thyroid gland contains only small low-attenuation nodules of questionable significance. Lungs/Pleura: On lung window images there are changes of centrilobular and paraseptal emphysema noted. No suspicious lung nodule is seen. No parenchymal infiltrate is seen and there is no evidence of pleural effusion. The central airway is patent. Musculoskeletal: The thoracic vertebrae are in normal alignment with only mild degenerative change in the lower thoracic spine. CT ABDOMEN PELVIS FINDINGS Hepatobiliary: There are multiple rounded low-attenuation structures scattered throughout both the right and left lobes of liver. The multitude these lesions and varying sizes is very worrisome for diffuse metastatic involvement of the liver. MRI is recommended to assess further. No intrahepatic ductal dilatation is seen. Pancreas: There is a low-attenuation lesion involving the uncinate process of the pancreas, and the pancreatic duct is significantly dilated distally. This lesion encases the superior mesenteric artery which is patent. Spleen: The spleen is unremarkable. Adrenals/Urinary Tract: The adrenal glands are symmetrical and normal. The kidneys enhance and there are bilateral renal cysts present right greater than left. No calculi are seen and there is no evidence of solid renal mass. No hydronephrosis is noted. On delayed images, the pelvocaliceal systems are unremarkable. The ureters are normal in caliber. The urinary bladder is not well distended but no gross abnormality is seen. Stomach/Bowel: The stomach  is largely decompressed. No small bowel distention is seen. There is feces throughout the colon but no colonic lesion is noted. The terminal ileum and appendix are unremarkable. Vascular/Lymphatic: No abdominal aortic aneurysm is seen. There is moderate change of abdominal aortic atherosclerosis noted. No definite adenopathy is seen. Reproductive: The uterus is normal in size. No adnexal lesion  is seen. there may be a tiny amount fluid within the pelvis. Other: None Musculoskeletal: The lumbar vertebrae are in normal alignment. No compression deformity is seen. IMPRESSION: 1. Indistinct low-attenuation mass in the uncinate process of the pancreas with dilatation of remainder of the pancreatic duct and some encasement of the SMA. These findings are highly indicative of pancreatic carcinoma. Recommend MRI to assess further. 2. Multiple low-attenuation lesions throughout the right and left lobes of liver most consistent with liver metastasis again MRI may be helpful. 3. Moderate change of abdominal aortic atherosclerosis. 4. Changes of centrilobular and paraseptal emphysema on CT of the chest. No lung lesion is seen. Electronically Signed   By: Ivar Drape M.D.   On: 06/21/2017 10:15   Ct Abdomen Pelvis W Contrast  Result Date: 06/21/2017 CLINICAL DATA:  Weight loss of 35 pounds over 6 months, loss of appetite, diarrhea for several months EXAM: CT CHEST, ABDOMEN, AND PELVIS WITH CONTRAST TECHNIQUE: Multidetector CT imaging of the chest, abdomen and pelvis was performed following the standard protocol during bolus administration of intravenous contrast. CONTRAST:  161mL ISOVUE-300 IOPAMIDOL (ISOVUE-300) INJECTION 61% COMPARISON:  Chest x-ray of 05/17/2017 Creatinine was obtained on site at [place] at [address] Results: Creatinine 0.9 mg/dL.  GFR of 88. FINDINGS: CT CHEST FINDINGS Cardiovascular: No significant coronary artery calcifications are seen. The heart is mildly enlarged. No pericardial effusion is noted. The mid ascending thoracic aorta measures 29 mm in diameter. The pulmonary arteries are faintly visualized proximally with no significant abnormality centrally. Mediastinum/Nodes: No mediastinal or hilar adenopathy is noted. The thyroid gland contains only small low-attenuation nodules of questionable significance. Lungs/Pleura: On lung window images there are changes of  centrilobular and paraseptal emphysema noted. No suspicious lung nodule is seen. No parenchymal infiltrate is seen and there is no evidence of pleural effusion. The central airway is patent. Musculoskeletal: The thoracic vertebrae are in normal alignment with only mild degenerative change in the lower thoracic spine. CT ABDOMEN PELVIS FINDINGS Hepatobiliary: There are multiple rounded low-attenuation structures scattered throughout both the right and left lobes of liver. The multitude these lesions and varying sizes is very worrisome for diffuse metastatic involvement of the liver. MRI is recommended to assess further. No intrahepatic ductal dilatation is seen. Pancreas: There is a low-attenuation lesion involving the uncinate process of the pancreas, and the pancreatic duct is significantly dilated distally. This lesion encases the superior mesenteric artery which is patent. Spleen: The spleen is unremarkable. Adrenals/Urinary Tract: The adrenal glands are symmetrical and normal. The kidneys enhance and there are bilateral renal cysts present right greater than left. No calculi are seen and there is no evidence of solid renal mass. No hydronephrosis is noted. On delayed images, the pelvocaliceal systems are unremarkable. The ureters are normal in caliber. The urinary bladder is not well distended but no gross abnormality is seen. Stomach/Bowel: The stomach is largely decompressed. No small bowel distention is seen. There is feces throughout the colon but no colonic lesion is noted. The terminal ileum and appendix are unremarkable. Vascular/Lymphatic: No abdominal aortic aneurysm is seen. There is moderate change of abdominal aortic atherosclerosis noted.  No definite adenopathy is seen. Reproductive: The uterus is normal in size. No adnexal lesion is seen. there may be a tiny amount fluid within the pelvis. Other: None Musculoskeletal: The lumbar vertebrae are in normal alignment. No compression deformity is seen.  IMPRESSION: 1. Indistinct low-attenuation mass in the uncinate process of the pancreas with dilatation of remainder of the pancreatic duct and some encasement of the SMA. These findings are highly indicative of pancreatic carcinoma. Recommend MRI to assess further. 2. Multiple low-attenuation lesions throughout the right and left lobes of liver most consistent with liver metastasis again MRI may be helpful. 3. Moderate change of abdominal aortic atherosclerosis. 4. Changes of centrilobular and paraseptal emphysema on CT of the chest. No lung lesion is seen. Electronically Signed   By: Ivar Drape M.D.   On: 06/21/2017 10:15    Labs:  CBC: No results for input(s): WBC, HGB, HCT, PLT in the last 8760 hours.  COAGS: No results for input(s): INR, APTT in the last 8760 hours.  BMP: No results for input(s): NA, K, CL, CO2, GLUCOSE, BUN, CALCIUM, CREATININE, GFRNONAA, GFRAA in the last 8760 hours.  Invalid input(s): CMP  LIVER FUNCTION TESTS: No results for input(s): BILITOT, AST, ALT, ALKPHOS, PROT, ALBUMIN in the last 8760 hours.  TUMOR MARKERS: No results for input(s): AFPTM, CEA, CA199, CHROMGRNA in the last 8760 hours.  Assessment and Plan:  Pancreatic mass Liver lesions Wt loss Scheduled for liver lesion biopsy per Oncology Risks and benefits discussed with the patient including, but not limited to bleeding, infection, damage to adjacent structures or low yield requiring additional tests. All of the patient's questions were answered, patient is agreeable to proceed. Consent signed and in chart.  Thank you for this interesting consult.  I greatly enjoyed meeting Madora Holstrom and look forward to participating in their care.  A copy of this report was sent to the requesting provider on this date.  Electronically Signed: Lavonia Drafts, PA-C 07/06/2017, 12:30 PM   I spent a total of  30 Minutes   in face to face in clinical consultation, greater than 50% of which was  counseling/coordinating care for liver lesion biopsy

## 2017-07-10 DIAGNOSIS — C259 Malignant neoplasm of pancreas, unspecified: Secondary | ICD-10-CM | POA: Insufficient documentation

## 2017-07-10 DIAGNOSIS — C787 Secondary malignant neoplasm of liver and intrahepatic bile duct: Principal | ICD-10-CM

## 2017-07-11 ENCOUNTER — Encounter: Payer: Self-pay | Admitting: Medical Oncology

## 2017-07-11 ENCOUNTER — Encounter: Payer: Self-pay | Admitting: Nurse Practitioner

## 2017-07-11 ENCOUNTER — Telehealth: Payer: Self-pay

## 2017-07-11 ENCOUNTER — Ambulatory Visit (HOSPITAL_BASED_OUTPATIENT_CLINIC_OR_DEPARTMENT_OTHER): Payer: Medicare Other | Admitting: Nurse Practitioner

## 2017-07-11 VITALS — BP 129/70 | HR 98 | Temp 98.0°F | Resp 18 | Ht 64.0 in | Wt 89.0 lb

## 2017-07-11 DIAGNOSIS — R634 Abnormal weight loss: Secondary | ICD-10-CM | POA: Diagnosis not present

## 2017-07-11 DIAGNOSIS — R197 Diarrhea, unspecified: Secondary | ICD-10-CM

## 2017-07-11 DIAGNOSIS — C259 Malignant neoplasm of pancreas, unspecified: Secondary | ICD-10-CM | POA: Diagnosis not present

## 2017-07-11 DIAGNOSIS — Z720 Tobacco use: Secondary | ICD-10-CM | POA: Diagnosis not present

## 2017-07-11 DIAGNOSIS — C787 Secondary malignant neoplasm of liver and intrahepatic bile duct: Secondary | ICD-10-CM

## 2017-07-11 DIAGNOSIS — Z7189 Other specified counseling: Secondary | ICD-10-CM

## 2017-07-11 MED ORDER — MIRTAZAPINE 15 MG PO TABS: 15.0000 mg | ORAL_TABLET | Freq: Every day | ORAL | 3 refills | Status: DC

## 2017-07-11 NOTE — Progress Notes (Signed)
  Oncology Nurse Navigator Documentation  Navigator Location: [facility] (07/11/17 1521)   )Navigator Encounter Type: Follow-up Appt (07/11/17 1521)                     Patient Visit Type: Follow-up (07/11/17 1521) Treatment Phase: Pre-Tx/Tx Discussion (07/11/17 1521) Barriers/Navigation Needs: No Questions;No Needs (07/11/17 1521) Education: Understanding Cancer/ Treatment Options (07/11/17 1521) Interventions: Education;Psycho-social support (07/11/17 1521)  I met with patient and her husband during med/onc visit today. I provided patient with my contact information and written material from the Pancreat[facility] [employer] and [email or web address] pertinent to her diagnosis. Patient verbalized understanding that she can call with questions or concerns.   Education Method: Written;Verbal (07/11/17 1521)      Acuity: Level 1 (07/11/17 1521) Acuity Level 1: Initial guidance, education and coordination as needed (07/11/17 1521)       Time Spent with Patient: 15 (07/11/17 1521)

## 2017-07-11 NOTE — Progress Notes (Addendum)
[place]  [phone] [phone]  Clinic Follow up Note   Patient Care Team: Wilford Corner, MD as PCP - General (Gastroenterology) 07/11/2017  CHIEF COMPLAINTS: F/u metastatic pancreas cancer      Pancreatic cancer metastasized to liver ([facility])   06/21/2017 Imaging    CT CAP IMPRESSION: 1. Indistinct low-attenuation mass in the uncinate process of the pancreas with dilatation of remainder of the pancreatic duct and some encasement of the SMA. These findings are highly indicative of pancreatic carcinoma. Recommend MRI to assess further. 2. Multiple low-attenuation lesions throughout the right and left lobes of liver most consistent with liver metastasis again MRI may be helpful. 3. Moderate change of abdominal aortic atherosclerosis. 4. Changes of centrilobular and paraseptal emphysema on CT of the chest. No lung lesion is seen.       07/06/2017 Initial Biopsy    Diagnosis Liver, needle/core biopsy, Right lobe - METASTATIC ADENOCARCINOMA.      07/10/2017 Initial Diagnosis    Pancreatic cancer metastasized to liver [facility])      INTERVAL HISTORY: Ms. Kimble returns for f/u as scheduled with her husband. She had liver biopsy 07/06/17, here to discuss results. She tolerated procedure well, denies bleeding, bruising or pain. She reports diarrhea alternating with formed stool, has been on creon for about 4 days without much improvement in diarrhea. She did not know she could take imodium together with creon. Decreased appetite with 4 lbs weight loss since in 1 week. Energy level is okay.   REVIEW OF SYSTEMS:   Constitutional: Denies fatigue, fevers, chills (+) abnormal weight loss, 4 lbs in 1 week (+) decreased appetite Eyes: Denies blurriness of vision Ears, nose, mouth, throat, and face: Denies mucositis or sore throat Respiratory: Denies cough, dyspnea or wheezes Cardiovascular: Denies palpitation, chest discomfort or lower extremity  swelling Gastrointestinal:  Denies nausea, vomiting, constipation, heartburn or change in bowel habits (+) s/p liver biopsy (+) diarrhea intermixed with solid stool, on creon x4 days without much improvement Skin: Denies abnormal skin rashes Lymphatics: Denies new lymphadenopathy or easy bruising Neurological:Denies numbness, tingling or new weaknesses Behavioral/Psych: Mood is stable, no new changes (+) generalized anxiety, controlled with ativan BID  All other systems were reviewed with the patient and are negative.  MEDICAL HISTORY:  Past Medical History:  Diagnosis Date  . Anxiety     SURGICAL HISTORY: No past surgical history on file.  I have reviewed the social history and family history with the patient and they are unchanged from previous note.  ALLERGIES:  has No Known Allergies.  MEDICATIONS:  Current Outpatient Medications  Medication Sig Dispense Refill  . LORazepam (ATIVAN) 1 MG tablet Take 1 mg 2 (two) times daily by mouth.    . Multiple Vitamin (MULTIVITAMIN WITH MINERALS) TABS tablet Take 1 tablet daily by mouth. One-A-Day for Women    . Pancrelipase, Lip-Prot-Amyl, (CREON) 24000-76000 units CPEP Take 1 capsule (24,000 Units total) 3 (three) times daily before meals by mouth. 180 capsule 2  . mirtazapine (REMERON) 15 MG tablet Take 1 tablet (15 mg total) by mouth at bedtime. 30 tablet 3   No current facility-administered medications for this visit.     PHYSICAL EXAMINATION: ECOG PERFORMANCE STATUS: 1 - Symptomatic but completely ambulatory  Vitals:   07/11/17 1510  BP: 129/70  Pulse: 98  Resp: 18  Temp: 98 F (36.7 C)  SpO2: 100%   Filed Weights   07/11/17 1510  Weight: 89 lb (40.4 kg)    GENERAL:alert,  no distress and comfortable SKIN: skin color, texture, turgor are normal, no rashes or significant lesions EYES: normal, Conjunctiva are pink and non-injected, sclera clear OROPHARYNX:no exudate, no erythema and lips, buccal mucosa, and tongue normal   NECK: supple, thyroid normal size, non-tender, without nodularity LYMPH:  no palpable cervical, supraclavicular, infraclavicular or axillary lymphadenopathy LUNGS: clear to auscultation bilaterally with normal breathing effort HEART: regular rate & rhythm and no murmurs and no lower extremity edema ABDOMEN:abdomen soft, non-tender and normal bowel sounds. (+) liver needle biopsy site healing well  Musculoskeletal:no cyanosis of digits and no clubbing  NEURO: alert & oriented x 3 with fluent speech, no focal motor/sensory deficits  LABORATORY DATA:  I have reviewed the data as listed CBC Latest Ref Rng & Units 07/06/2017  WBC 4.0 - 10.5 K/uL 8.3  Hemoglobin 12.0 - 15.0 g/dL 12.9  Hematocrit 36.0 - 46.0 % 37.4  Platelets 150 - 400 K/uL 204     RADIOGRAPHIC STUDIES: I have personally reviewed the radiological images as listed and agreed with the findings in the report. No results found.   CT CAP 06/21/17 IMPRESSION: 1. Indistinct low-attenuation mass in the uncinate process of the pancreas with dilatation of remainder of the pancreatic duct and some encasement of the SMA. These findings are highly indicative of pancreatic carcinoma. Recommend MRI to assess further. 2. Multiple low-attenuation lesions throughout the right and left lobes of liver most consistent with liver metastasis again MRI may be helpful. 3. Moderate change of abdominal aortic atherosclerosis. 4. Changes of centrilobular and paraseptal emphysema on CT of the chest. No lung lesion is seen.   ASSESSMENT & PLAN: 66 year old female with history of generalized anxiety disorder and 35 pounds weight loss over 6 months with decreased appetite and diarrhea. Liver biopsy confirms metastatic pancreas malignancy.   1. Metastatic pancreatic adenocarcinoma with metastasis to liver 2. Weight loss, decreased appetite 3. Diarrhea 4. Smoking cessation  We reviewed liver biopsy which confirmed metastatic pancreatic  adenocarcinoma in detail with the patient and her husband. Surgery is not an option due to metastatic spread to the liver and local radiation would be reserved for palliation if she becomes symptomatic. She is a candidate for systemic chemotherapy, we discussed gemzar abraxane weekly x3 weeks with 1 week off vs FOLFOX q2 weeks; she declined FOLFOX regimen. She may be eligible for phase 3 CanStem randomized to either standard of care gem/abraxane +/- BBI-608 (Napabucasin). Rubin Payor, RN in clinical research discussed with patient today and gave consent form to review. The patient will consider. Recommended PAC placement, will have done in IR; order placed today. Chemotherapy consent: Side effects including but not not limited to fatigue, nausea, vomiting, diarrhea, hair loss, neuropathy, fluid retention, renal and kidney dysfunction, neutropenic fever, need for blood transfusion, and bleeding were discussed with patient in great detail. She agrees to proceed. She will attend chemo class prior to 1st cycle. Her CA 19-9 was not elevated, will not follow.  For persistent diarrhea, she began creon but was not aware she could take imodium with it. I reviewed dosing instructions, she will start imodium. For decreased appetite and generalized anxiety, she will start remeron; prescription sent to pharmacy today. She will ween ativan to once daily.   She is interested in quitting smoking, we encouraged her. She does not want to try nicotine products. She is asking about Chantix, has apt with PCP next week, recommended she discuss this with PCP.   PLAN: Referral placed to nutrition Research to review  eligibility and f/u with patient Prescription for remeron sent today; ween ativan to once daily Continue creon, use imodium for diarrhea as instructed PAC per IR, order placed today Chemo class 1-2 weeks Return for lab, f/u, chemo in 2 weeks; will consider clinical trial CanStem Discuss chantix and smoking cessation  products at PCP f/u next week   Orders Placed This Encounter  Procedures  . IR Fluoro Guide CV Line Right    Indicate type of CVC ordering    Standing Status:   Future    Standing Expiration Date:   09/10/2018    Order Specific Question:   Reason for exam:    Answer:   venous access for chemotherapy    Order Specific Question:   Preferred Imaging Location?    Answer:   [facility]  . CBC with Differential    Standing Status:   Standing    Number of Occurrences:   100    Standing Expiration Date:   07/11/2018  . Comprehensive metabolic panel    Standing Status:   Standing    Number of Occurrences:   100    Standing Expiration Date:   07/11/2018  . Ambulatory referral to Nutrition and Diabetic E    Referral Priority:   Routine    Referral Type:   Consultation    Referral Reason:   Specialty Services Required    Number of Visits Requested:   1   All questions were answered. The patient knows to call the clinic with any problems, questions or concerns. No barriers to learning was detected.   Alla Feeling, NP 07/11/17   Addendum  I have seen the patient, examined her. I agree with the assessment and and plan and have edited the notes.   We reviewed her liver biopsy pathology findings, which confirmed metastatic pancreatic cancer to liver.  We discussed her cancer is not curable at this stage, but treatable.  I encouraged her to consider systemic chemotherapy.  Chemo consent for first-line gemcitabine and Abraxane was obtained today.  She will be screened for the clinical trial CanStem 111P. Plan to start chemo in 1-2 weeks.  The goal of therapy is palliative.   Truitt Merle  07/11/2017

## 2017-07-11 NOTE — Telephone Encounter (Signed)
Printed avs and calender for upcoming appointment. Per 11/21 los 

## 2017-07-16 ENCOUNTER — Other Ambulatory Visit: Payer: Medicare Other

## 2017-07-17 ENCOUNTER — Other Ambulatory Visit: Payer: Medicare Other

## 2017-07-17 ENCOUNTER — Telehealth: Payer: Self-pay | Admitting: *Deleted

## 2017-07-17 NOTE — Telephone Encounter (Signed)
No additional note

## 2017-07-20 ENCOUNTER — Telehealth: Payer: Self-pay

## 2017-07-20 NOTE — Telephone Encounter (Signed)
Pt called she caught a cold. Not bringing up mucus. Her brother MD in [place] said she could take amoxicillin. Her husband has amoxicillin in the house he is not taking. Amoxicillin 500 / Clavulanate 125 mg (Augmentin) BID for 10 days that he got from [place].   She got the cold from her husband. She started sx yesterday. Runny nose mucous. Nasal congestion off and on. No fever. Throat not sore, chest not congested. First chemo is Wednesday.   S/w Dr Burr Medico and called pt back. She is not to take antibiotic at present. She can take OTC cold medicine. Drink plenty of fluids. If she develops a fever or gets productive cough of colored sputum would be the time to consider antibiotic.  Pt appreciative of call.

## 2017-07-22 ENCOUNTER — Other Ambulatory Visit: Payer: Self-pay | Admitting: Radiology

## 2017-07-23 ENCOUNTER — Other Ambulatory Visit: Payer: Self-pay | Admitting: Hematology

## 2017-07-23 ENCOUNTER — Other Ambulatory Visit: Payer: Medicare Other

## 2017-07-23 ENCOUNTER — Encounter: Payer: Self-pay | Admitting: Nurse Practitioner

## 2017-07-23 ENCOUNTER — Other Ambulatory Visit: Payer: Self-pay | Admitting: Radiology

## 2017-07-23 DIAGNOSIS — Z7189 Other specified counseling: Secondary | ICD-10-CM | POA: Insufficient documentation

## 2017-07-23 MED ORDER — LIDOCAINE-PRILOCAINE 2.5-2.5 % EX CREA: TOPICAL_CREAM | CUTANEOUS | 3 refills | Status: DC

## 2017-07-23 MED ORDER — ONDANSETRON HCL 8 MG PO TABS: 8.0000 mg | ORAL_TABLET | Freq: Two times a day (BID) | ORAL | 1 refills | Status: DC | PRN

## 2017-07-23 MED ORDER — PROCHLORPERAZINE MALEATE 10 MG PO TABS: 10.0000 mg | ORAL_TABLET | Freq: Four times a day (QID) | ORAL | 1 refills | Status: DC | PRN

## 2017-07-23 NOTE — Progress Notes (Signed)
START ON PATHWAY REGIMEN - Pancreatic     A cycle is every 28 days:     Nab-paclitaxel (protein bound)      Gemcitabine   **Always confirm dose/schedule in your pharmacy ordering system**    Patient Characteristics: Adenocarcinoma, Metastatic Disease, First Line, PS ? 2 Histology: Adenocarcinoma Current evidence of distant metastases<= Yes AJCC T Category: TX AJCC N Category: NX AJCC M Category: M1 AJCC 8 Stage Grouping: IV Line of Therapy: First Line Would you be surprised if this patient died  in the next year<= I would NOT be surprised if this patient died in the next year Intent of Therapy: Non-Curative / Palliative Intent, Discussed with Patient

## 2017-07-24 ENCOUNTER — Encounter (HOSPITAL_COMMUNITY): Payer: Self-pay

## 2017-07-24 ENCOUNTER — Other Ambulatory Visit: Payer: Self-pay | Admitting: Nurse Practitioner

## 2017-07-24 ENCOUNTER — Ambulatory Visit (HOSPITAL_COMMUNITY)
Admission: RE | Admit: 2017-07-24 | Discharge: 2017-07-24 | Disposition: A | Discharge status: Home / Self Care | Payer: Medicare Other | Source: Ambulatory Visit | Attending: Nurse Practitioner | Admitting: Nurse Practitioner

## 2017-07-24 DIAGNOSIS — C259 Malignant neoplasm of pancreas, unspecified: Secondary | ICD-10-CM

## 2017-07-24 DIAGNOSIS — C787 Secondary malignant neoplasm of liver and intrahepatic bile duct: Secondary | ICD-10-CM | POA: Insufficient documentation

## 2017-07-24 DIAGNOSIS — F419 Anxiety disorder, unspecified: Secondary | ICD-10-CM | POA: Insufficient documentation

## 2017-07-24 DIAGNOSIS — Z5111 Encounter for antineoplastic chemotherapy: Secondary | ICD-10-CM | POA: Diagnosis not present

## 2017-07-24 DIAGNOSIS — Z452 Encounter for adjustment and management of vascular access device: Secondary | ICD-10-CM | POA: Diagnosis not present

## 2017-07-24 HISTORY — PX: IR US GUIDE VASC ACCESS RIGHT: IMG2390

## 2017-07-24 HISTORY — PX: IR FLUORO GUIDE PORT INSERTION RIGHT: IMG5741

## 2017-07-24 LAB — CBC WITH DIFFERENTIAL/PLATELET
Basophils Absolute: 0 10*3/uL (ref 0.0–0.1)
Basophils Relative: 0 %
EOS PCT: 1 %
Eosinophils Absolute: 0.1 10*3/uL (ref 0.0–0.7)
HCT: 39 % (ref 36.0–46.0)
Hemoglobin: 13.9 g/dL (ref 12.0–15.0)
LYMPHS ABS: 2.3 10*3/uL (ref 0.7–4.0)
LYMPHS PCT: 24 %
MCH: 32.3 pg (ref 26.0–34.0)
MCHC: 35.6 g/dL (ref 30.0–36.0)
MCV: 90.7 fL (ref 78.0–100.0)
MONO ABS: 0.8 10*3/uL (ref 0.1–1.0)
MONOS PCT: 8 %
Neutro Abs: 6.5 10*3/uL (ref 1.7–7.7)
Neutrophils Relative %: 67 %
PLATELETS: 272 10*3/uL (ref 150–400)
RBC: 4.3 MIL/uL (ref 3.87–5.11)
RDW: 12.7 % (ref 11.5–15.5)
WBC: 9.7 10*3/uL (ref 4.0–10.5)

## 2017-07-24 LAB — BASIC METABOLIC PANEL
Anion gap: 8 (ref 5–15)
BUN: 13 mg/dL (ref 6–20)
CHLORIDE: 102 mmol/L (ref 101–111)
CO2: 25 mmol/L (ref 22–32)
Calcium: 9.7 mg/dL (ref 8.9–10.3)
Creatinine, Ser: 0.79 mg/dL (ref 0.44–1.00)
GFR calc Af Amer: >60 mL/min (ref >60)
GLUCOSE: 86 mg/dL (ref 65–99)
POTASSIUM: 3.3 mmol/L — AB (ref 3.5–5.1)
Sodium: 135 mmol/L (ref 135–145)

## 2017-07-24 LAB — PROTIME-INR
INR: 1.02
Prothrombin Time: 13.3 seconds (ref 11.4–15.2)

## 2017-07-24 MED ORDER — LIDOCAINE-EPINEPHRINE (PF) 1 %-1:200000 IJ SOLN
INTRAMUSCULAR | Status: AC | PRN
  Administered 2017-07-24: 20 mL

## 2017-07-24 MED ORDER — CEFAZOLIN SODIUM-DEXTROSE 2-4 GM/100ML-% IV SOLN
2.0000 g | INTRAVENOUS | Status: AC
  Administered 2017-07-24: 2 g via INTRAVENOUS

## 2017-07-24 MED ORDER — HEPARIN SOD (PORK) LOCK FLUSH 100 UNIT/ML IV SOLN
INTRAVENOUS | Status: AC
  Filled 2017-07-24: qty 5

## 2017-07-24 MED ORDER — FENTANYL CITRATE (PF) 100 MCG/2ML IJ SOLN
INTRAMUSCULAR | Status: AC | PRN
  Administered 2017-07-24 (×2): 50 ug via INTRAVENOUS

## 2017-07-24 MED ORDER — SODIUM CHLORIDE 0.9 % IV SOLN
INTRAVENOUS | Status: DC
  Administered 2017-07-24: 13:00:00 via INTRAVENOUS

## 2017-07-24 MED ORDER — CEFAZOLIN SODIUM-DEXTROSE 2-4 GM/100ML-% IV SOLN
INTRAVENOUS | Status: AC
Start: 2017-07-24 — End: 2017-07-24
  Administered 2017-07-24: 2 g via INTRAVENOUS
  Filled 2017-07-24: qty 100

## 2017-07-24 MED ORDER — MIDAZOLAM HCL 2 MG/2ML IJ SOLN
INTRAMUSCULAR | Status: AC
  Filled 2017-07-24: qty 4

## 2017-07-24 MED ORDER — LIDOCAINE-EPINEPHRINE (PF) 2 %-1:200000 IJ SOLN
INTRAMUSCULAR | Status: AC
  Filled 2017-07-24: qty 20

## 2017-07-24 MED ORDER — FENTANYL CITRATE (PF) 100 MCG/2ML IJ SOLN
INTRAMUSCULAR | Status: AC
  Filled 2017-07-24: qty 4

## 2017-07-24 MED ORDER — MIDAZOLAM HCL 2 MG/2ML IJ SOLN
INTRAMUSCULAR | Status: AC | PRN
  Administered 2017-07-24 (×2): 1 mg via INTRAVENOUS

## 2017-07-24 NOTE — Discharge Instructions (Signed)
Moderate Conscious Sedation, Adult, Care After These instructions provide you with information about caring for yourself after your procedure. Your health care provider may also give you more specific instructions. Your treatment has been planned according to current medical practices, but problems sometimes occur. Call your health care provider if you have any problems or questions after your procedure. What can I expect after the procedure? After your procedure, it is common:  To feel sleepy for several hours.  To feel clumsy and have poor balance for several hours.  To have poor judgment for several hours.  To vomit if you eat too soon.  Follow these instructions at home: For at least 24 hours after the procedure:   Do not: ? Participate in activities where you could fall or become injured. ? Drive. ? Use heavy machinery. ? Drink alcohol. ? Take sleeping pills or medicines that cause drowsiness. ? Make important decisions or sign legal documents. ? Take care of children on your own.  Rest. Eating and drinking  Follow the diet recommended by your health care provider.  If you vomit: ? Drink water, juice, or soup when you can drink without vomiting. ? Make sure you have little or no nausea before eating solid foods. General instructions  Have a responsible adult stay with you until you are awake and alert.  Take over-the-counter and prescription medicines only as told by your health care provider.  If you smoke, do not smoke without supervision.  Keep all follow-up visits as told by your health care provider. This is important. Contact a health care provider if:  You keep feeling nauseous or you keep vomiting.  You feel light-headed.  You develop a rash.  You have a fever. Get help right away if:  You have trouble breathing. This information is not intended to replace advice given to you by your health care provider. Make sure you discuss any questions you have  with your health care provider. Document Released: 05/28/2013 Document Revised: 01/10/2016 Document Reviewed: 11/27/2015 Elsevier Interactive Patient Education  2018 Walker Insertion, Care After This sheet gives you information about how to care for yourself after your procedure. Your health care provider may also give you more specific instructions. If you have problems or questions, contact your health care provider. What can I expect after the procedure? After your procedure, it is common to have:  Discomfort at the port insertion site.  Bruising on the skin over the port. This should improve over 3-4 days.  Follow these instructions at home: [facility] care  After your port is placed, you will get a manufacturer's information card. The card has information about your port. Keep this card with you at all times.  Take care of the port as told by your health care provider. Ask your health care provider if you or a family member can get training for taking care of the port at home. A home health care nurse may also take care of the port.  Make sure to remember what type of port you have. Incision care  Follow instructions from your health care provider about how to take care of your port insertion site. Make sure you: ? Wash your hands with soap and water before you change your bandage (dressing). If soap and water are not available, use hand sanitizer. ? Change your dressing as told by your health care provider.  You may remove your dressing tomorrow. ? Leave skin glue, or adhesive strips in place.  These skin closures may need to stay in place for 2 weeks or longer. If adhesive strip edges start to loosen and curl up, you may trim the loose edges. Do not remove adhesive strips completely unless your health care provider tells you to do that.  DO NOT use EMLA cream for 2 weeks after port placement as this cream will remove surgical glue.  Check your port insertion site  every day for signs of infection. Check for: ? More redness, swelling, or pain. ? More fluid or blood. ? Warmth. ? Pus or a bad smell. General instructions  Do not take baths, swim, or use a hot tub until your health care provider approves.  You may shower tomorrow.  Do not lift anything that is heavier than 10 lb (4.5 kg) for a week, or as told by your health care provider.  Ask your health care provider when it is okay to: ? Return to work or school. ? Resume usual physical activities or sports.  Do not drive for 24 hours if you were given a medicine to help you relax (sedative).  Take over-the-counter and prescription medicines only as told by your health care provider.  Wear a medical alert bracelet in case of an emergency. This will tell any health care providers that you have a port.  Keep all follow-up visits as told by your health care provider. This is important. Contact a health care provider if:  You have a fever or chills.  You have more redness, swelling, or pain around your port insertion site.  You have more fluid or blood coming from your port insertion site.  Your port insertion site feels warm to the touch.  You have pus or a bad smell coming from the port insertion site. Get help right away if:  You have chest pain or shortness of breath.  You have bleeding from your port that you cannot control. Summary  Take care of the port as told by your health care provider.  Change your dressing as told by your health care provider.  Keep all follow-up visits as told by your health care provider. This information is not intended to replace advice given to you by your health care provider. Make sure you discuss any questions you have with your health care provider. Document Released: 05/28/2013 Document Revised: 06/28/2016 Document Reviewed: 06/28/2016 Elsevier Interactive Patient Education  2017 [employer].

## 2017-07-24 NOTE — H&P (Signed)
Referring Physician(s): Burton,Lacie K/Feng,Y  Supervising Physician: Daryll Brod  Patient Status:  [facility] OP  Chief Complaint:  "I'm getting a port put in"  Subjective: Patient familiar to IR service from prior liver lesion biopsy on 07/06/17.  She has a history of metastatic pancreatic cancer and presents today for Port-A-Cath placement for palliative chemotherapy.  She currently denies fever, headache, chest pain, dyspnea, abdominal/back pain, nausea, vomiting or bleeding.  She does have occasional cough, occasional "gas" pains and significant weight loss.  She continues to smoke. Past Medical History:  Diagnosis Date  . Anxiety   History reviewed. No pertinent surgical history.   Allergies: Patient has no known allergies.  Medications: Prior to Admission medications   Medication Sig Start Date End Date Taking? Authorizing Provider  LORazepam (ATIVAN) 1 MG tablet Take 1 mg 2 (two) times daily by mouth.   Yes [provider]  mirtazapine (REMERON) 15 MG tablet Take 1 tablet (15 mg total) by mouth at bedtime. 07/11/17  Yes Alla Feeling, NP  Multiple Vitamin (MULTIVITAMIN WITH MINERALS) TABS tablet Take 1 tablet daily by mouth. One-A-Day for Women   Yes [provider]  Pancrelipase, Lip-Prot-Amyl, (CREON) 24000-76000 units CPEP Take 1 capsule (24,000 Units total) 3 (three) times daily before meals by mouth. 07/03/17  Yes Truitt Merle, MD  lidocaine-prilocaine (EMLA) cream Apply to affected area once 07/23/17   Truitt Merle, MD  ondansetron (ZOFRAN) 8 MG tablet Take 1 tablet (8 mg total) by mouth 2 (two) times daily as needed (Nausea or vomiting). 07/23/17   Truitt Merle, MD  prochlorperazine (COMPAZINE) 10 MG tablet Take 1 tablet (10 mg total) by mouth every 6 (six) hours as needed (Nausea or vomiting). 07/23/17   Truitt Merle, MD     Vital Signs: BP (!) 113/59   Pulse 88   Temp 98.5 F (36.9 C) (Oral)   Resp 18   Ht 5\' 4"  (1.626 m)   Wt 88 lb 3.2 oz (40 kg)    SpO2 99%   BMI 15.14 kg/m   Physical Exam awake, alert.  Chest with distant breath sounds bilaterally.  Heart with regular rate and rhythm.  Abdomen soft, positive bowel sounds, nontender, prominent aortic pulsation noted; lower extremity edema.  Imaging: No results found.  Labs:  CBC: Recent Labs    07/06/17 1233 07/24/17 1244  WBC 8.3 9.7  HGB 12.9 13.9  HCT 37.4 39.0  PLT 204 272    COAGS: Recent Labs    07/06/17 1233 07/24/17 1244  INR 1.05 1.02  APTT 27  --     BMP: Recent Labs    07/24/17 1244  NA 135  K 3.3*  CL 102  CO2 25  GLUCOSE 86  BUN 13  CALCIUM 9.7  CREATININE 0.79  GFRNONAA >60  GFRAA >60    LIVER FUNCTION TESTS: No results for input(s): BILITOT, AST, ALT, ALKPHOS, PROT, ALBUMIN in the last 8760 hours.  Assessment and Plan: Pt with history of metastatic pancreatic cancer ; presents today for Port-A-Cath placement for palliative chemotherapy.Risks and benefits discussed with the patient including, but not limited to bleeding, infection, pneumothorax, or fibrin sheath development and need for additional procedures. All of the patient's questions were answered, patient is agreeable to proceed. Consent signed and in chart.     Electronically Signed: D. Rowe Robert, PA-C 07/24/2017, 2:02 PM   I spent a total of 20 minutes at the the patient's bedside AND on the patient's hospital floor or unit,  greater than 50% of which was counseling/coordinating care for Port-A-Cath placement

## 2017-07-24 NOTE — Procedures (Signed)
panc ca with liver mets  S/p RT IJ POWER PORT Tip svcra No comp Ready for use Full report in pacs ebl 0

## 2017-07-25 ENCOUNTER — Telehealth: Payer: Self-pay | Admitting: Hematology

## 2017-07-25 ENCOUNTER — Ambulatory Visit (HOSPITAL_BASED_OUTPATIENT_CLINIC_OR_DEPARTMENT_OTHER): Payer: Medicare Other

## 2017-07-25 ENCOUNTER — Encounter: Payer: Self-pay | Admitting: Nurse Practitioner

## 2017-07-25 ENCOUNTER — Ambulatory Visit (HOSPITAL_BASED_OUTPATIENT_CLINIC_OR_DEPARTMENT_OTHER): Payer: Medicare Other | Admitting: Nurse Practitioner

## 2017-07-25 ENCOUNTER — Ambulatory Visit: Payer: Medicare Other

## 2017-07-25 VITALS — BP 113/60 | HR 84 | Temp 98.2°F | Resp 18 | Ht 64.0 in | Wt 89.2 lb

## 2017-07-25 DIAGNOSIS — C787 Secondary malignant neoplasm of liver and intrahepatic bile duct: Secondary | ICD-10-CM | POA: Diagnosis not present

## 2017-07-25 DIAGNOSIS — C259 Malignant neoplasm of pancreas, unspecified: Secondary | ICD-10-CM

## 2017-07-25 DIAGNOSIS — Z5111 Encounter for antineoplastic chemotherapy: Secondary | ICD-10-CM | POA: Diagnosis not present

## 2017-07-25 DIAGNOSIS — Z7189 Other specified counseling: Secondary | ICD-10-CM

## 2017-07-25 DIAGNOSIS — Z720 Tobacco use: Secondary | ICD-10-CM | POA: Diagnosis not present

## 2017-07-25 LAB — CBC WITH DIFFERENTIAL/PLATELET
BASO%: 0.4 % (ref 0.0–2.0)
BASOS ABS: 0 10*3/uL (ref 0.0–0.1)
EOS ABS: 0.1 10*3/uL (ref 0.0–0.5)
EOS%: 1.4 % (ref 0.0–7.0)
HEMATOCRIT: 35 % (ref 34.8–46.6)
HGB: 11.7 g/dL (ref 11.6–15.9)
LYMPH#: 1.7 10*3/uL (ref 0.9–3.3)
LYMPH%: 21.2 % (ref 14.0–49.7)
MCH: 31.6 pg (ref 25.1–34.0)
MCHC: 33.5 g/dL (ref 31.5–36.0)
MCV: 94.2 fL (ref 79.5–101.0)
MONO#: 0.7 10*3/uL (ref 0.1–0.9)
MONO%: 8.4 % (ref 0.0–14.0)
NEUT#: 5.6 10*3/uL (ref 1.5–6.5)
NEUT%: 68.6 % (ref 38.4–76.8)
PLATELETS: 237 10*3/uL (ref 145–400)
RBC: 3.72 10*6/uL (ref 3.70–5.45)
RDW: 13 % (ref 11.2–14.5)
WBC: 8.1 10*3/uL (ref 3.9–10.3)

## 2017-07-25 LAB — COMPREHENSIVE METABOLIC PANEL
ALBUMIN: 3.6 g/dL (ref 3.5–5.0)
ALT: 20 U/L (ref 0–55)
AST: 27 U/L (ref 5–34)
Alkaline Phosphatase: 59 U/L (ref 40–150)
Anion Gap: 9 mEq/L (ref 3–11)
BUN: 13.5 mg/dL (ref 7.0–26.0)
CO2: 21 meq/L — AB (ref 22–29)
Calcium: 9.5 mg/dL (ref 8.4–10.4)
Chloride: 107 mEq/L (ref 98–109)
Creatinine: 0.8 mg/dL (ref 0.6–1.1)
GLUCOSE: 125 mg/dL (ref 70–140)
POTASSIUM: 3.6 meq/L (ref 3.5–5.1)
SODIUM: 137 meq/L (ref 136–145)
TOTAL PROTEIN: 7.3 g/dL (ref 6.4–8.3)
Total Bilirubin: 0.41 mg/dL (ref 0.20–1.20)

## 2017-07-25 MED ORDER — HEPARIN SOD (PORK) LOCK FLUSH 100 UNIT/ML IV SOLN
500.0000 [IU] | Freq: Once | INTRAVENOUS | Status: AC | PRN
  Administered 2017-07-25: 500 [IU]
  Filled 2017-07-25: qty 5

## 2017-07-25 MED ORDER — SODIUM CHLORIDE 0.9% FLUSH
10.0000 mL | INTRAVENOUS | Status: DC | PRN
  Administered 2017-07-25: 10 mL
  Filled 2017-07-25: qty 10

## 2017-07-25 MED ORDER — SODIUM CHLORIDE 0.9% FLUSH
10.0000 mL | Freq: Once | INTRAVENOUS | Status: AC
  Administered 2017-07-25: 10 mL
  Filled 2017-07-25: qty 10

## 2017-07-25 MED ORDER — PROCHLORPERAZINE MALEATE 10 MG PO TABS
ORAL_TABLET | ORAL | Status: AC
  Filled 2017-07-25: qty 1

## 2017-07-25 MED ORDER — SODIUM CHLORIDE 0.9 % IV SOLN
Freq: Once | INTRAVENOUS | Status: AC
  Administered 2017-07-25: 14:00:00 via INTRAVENOUS

## 2017-07-25 MED ORDER — GEMCITABINE HCL CHEMO INJECTION 1 GM/26.3ML
1000.0000 mg/m2 | Freq: Once | INTRAVENOUS | Status: AC
  Administered 2017-07-25: 1368 mg via INTRAVENOUS
  Filled 2017-07-25: qty 35.98

## 2017-07-25 MED ORDER — PROCHLORPERAZINE MALEATE 10 MG PO TABS
10.0000 mg | ORAL_TABLET | Freq: Once | ORAL | Status: AC
  Administered 2017-07-25: 10 mg via ORAL

## 2017-07-25 MED ORDER — PACLITAXEL PROTEIN-BOUND CHEMO INJECTION 100 MG
125.0000 mg/m2 | Freq: Once | Status: AC
  Administered 2017-07-25: 175 mg via INTRAVENOUS
  Filled 2017-07-25: qty 35

## 2017-07-25 NOTE — Patient Instructions (Signed)
[facility] Discharge Instructions for Patients Receiving Chemotherapy  Today you received the following chemotherapy agents: Abraxane and Gemzar.  To help prevent nausea and vomiting after your treatment, we encourage you to take your nausea medication as prescribed by your doctor.   If you develop nausea and vomiting that is not controlled by your nausea medication, call the clinic.   BELOW ARE SYMPTOMS THAT SHOULD BE REPORTED IMMEDIATELY:  *FEVER GREATER THAN 100.5 F  *CHILLS WITH OR WITHOUT FEVER  NAUSEA AND VOMITING THAT IS NOT CONTROLLED WITH YOUR NAUSEA MEDICATION  *UNUSUAL SHORTNESS OF BREATH  *UNUSUAL BRUISING OR BLEEDING  TENDERNESS IN MOUTH AND THROAT WITH OR WITHOUT PRESENCE OF ULCERS  *URINARY PROBLEMS  *BOWEL PROBLEMS  UNUSUAL RASH Items with * indicate a potential emergency and should be followed up as soon as possible.  Feel free to call the clinic should you have any questions or concerns. The clinic phone number is [phone].  Please show the [facility] at check-in to the Emergency Department and triage nurse.  Nanoparticle Albumin-Bound Paclitaxel injection (ABRAXANE) What is this medicine? NANOPARTICLE ALBUMIN-BOUND PACLITAXEL (Na no PAHR ti kuhl al BYOO muhn-bound PAK li TAX el) is a chemotherapy drug. It targets fast dividing cells, like cancer cells, and causes these cells to die. This medicine is used to treat advanced breast cancer and advanced lung cancer. This medicine may be used for other purposes; ask your health care provider or pharmacist if you have questions. COMMON BRAND NAME(S): Abraxane What should I tell my health care provider before I take this medicine? They need to know if you have any of these conditions: -kidney disease -liver disease -low blood counts, like low platelets, red blood cells, or white blood cells -recent or ongoing radiation therapy -an unusual or allergic reaction to paclitaxel, albumin,  other chemotherapy, other medicines, foods, dyes, or preservatives -pregnant or trying to get pregnant -breast-feeding How should I use this medicine? This drug is given as an infusion into a vein. It is administered in a hospital or clinic by a specially trained health care professional. Talk to your pediatrician regarding the use of this medicine in children. Special care may be needed. Overdosage: If you think you have taken too much of this medicine contact a poison control center or emergency room at once. NOTE: This medicine is only for you. Do not share this medicine with others. What if I miss a dose? It is important not to miss your dose. Call your doctor or health care professional if you are unable to keep an appointment. What may interact with this medicine? -cyclosporine -diazepam -ketoconazole -medicines to increase blood counts like filgrastim, pegfilgrastim, sargramostim -other chemotherapy drugs like cisplatin, doxorubicin, epirubicin, etoposide, teniposide, vincristine -quinidine -testosterone -vaccines -verapamil Talk to your doctor or health care professional before taking any of these medicines: -acetaminophen -aspirin -ibuprofen -ketoprofen -naproxen This list may not describe all possible interactions. Give your health care provider a list of all the medicines, herbs, non-prescription drugs, or dietary supplements you use. Also tell them if you smoke, drink alcohol, or use illegal drugs. Some items may interact with your medicine. What should I watch for while using this medicine? Your condition will be monitored carefully while you are receiving this medicine. You will need important blood work done while you are taking this medicine. This medicine can cause serious allergic reactions. If you experience allergic reactions like skin rash, itching or hives, swelling of the face, lips, or tongue, tell  your doctor or health care professional right away. In some  cases, you may be given additional medicines to help with side effects. Follow all directions for their use. This drug may make you feel generally unwell. This is not uncommon, as chemotherapy can affect healthy cells as well as cancer cells. Report any side effects. Continue your course of treatment even though you feel ill unless your doctor tells you to stop. Call your doctor or health care professional for advice if you get a fever, chills or sore throat, or other symptoms of a cold or flu. Do not treat yourself. This drug decreases your body's ability to fight infections. Try to avoid being around people who are sick. This medicine may increase your risk to bruise or bleed. Call your doctor or health care professional if you notice any unusual bleeding. Be careful brushing and flossing your teeth or using a toothpick because you may get an infection or bleed more easily. If you have any dental work done, tell your dentist you are receiving this medicine. Avoid taking products that contain aspirin, acetaminophen, ibuprofen, naproxen, or ketoprofen unless instructed by your doctor. These medicines may hide a fever. Do not become pregnant while taking this medicine. Women should inform their doctor if they wish to become pregnant or think they might be pregnant. There is a potential for serious side effects to an unborn child. Talk to your health care professional or pharmacist for more information. Do not breast-feed an infant while taking this medicine. Men are advised not to father a child while receiving this medicine. What side effects may I notice from receiving this medicine? Side effects that you should report to your doctor or health care professional as soon as possible: -allergic reactions like skin rash, itching or hives, swelling of the face, lips, or tongue -low blood counts - This drug may decrease the number of white blood cells, red blood cells and platelets. You may be at increased  risk for infections and bleeding. -signs of infection - fever or chills, cough, sore throat, pain or difficulty passing urine -signs of decreased platelets or bleeding - bruising, pinpoint red spots on the skin, black, tarry stools, nosebleeds -signs of decreased red blood cells - unusually weak or tired, fainting spells, lightheadedness -breathing problems -changes in vision -chest pain -high or low blood pressure -mouth sores -nausea and vomiting -pain, swelling, redness or irritation at the injection site -pain, tingling, numbness in the hands or feet -slow or irregular heartbeat -swelling of the ankle, feet, hands Side effects that usually do not require medical attention (report to your doctor or health care professional if they continue or are bothersome): -aches, pains -changes in the color of fingernails -diarrhea -hair loss -loss of appetite This list may not describe all possible side effects. Call your doctor for medical advice about side effects. You may report side effects to FDA at 1-800-FDA-1088. Where should I keep my medicine? This drug is given in a hospital or clinic and will not be stored at home. NOTE: This sheet is a summary. It may not cover all possible information. If you have questions about this medicine, talk to your doctor, pharmacist, or health care provider.  2018 [employer] Standard (2015-06-09 10:05:20)  Gemcitabine injection (GEMZAR) What is this medicine? GEMCITABINE (jem SIT a been) is a chemotherapy drug. This medicine is used to treat many types of cancer like breast cancer, lung cancer, pancreatic cancer, and ovarian cancer. This medicine may be used for other purposes;  ask your health care provider or pharmacist if you have questions. COMMON BRAND NAME(S): Gemzar What should I tell my health care provider before I take this medicine? They need to know if you have any of these conditions: -blood disorders -infection -kidney disease -liver  disease -recent or ongoing radiation therapy -an unusual or allergic reaction to gemcitabine, other chemotherapy, other medicines, foods, dyes, or preservatives -pregnant or trying to get pregnant -breast-feeding How should I use this medicine? This drug is given as an infusion into a vein. It is administered in a hospital or clinic by a specially trained health care professional. Talk to your pediatrician regarding the use of this medicine in children. Special care may be needed. Overdosage: If you think you have taken too much of this medicine contact a poison control center or emergency room at once. NOTE: This medicine is only for you. Do not share this medicine with others. What if I miss a dose? It is important not to miss your dose. Call your doctor or health care professional if you are unable to keep an appointment. What may interact with this medicine? -medicines to increase blood counts like filgrastim, pegfilgrastim, sargramostim -some other chemotherapy drugs like cisplatin -vaccines Talk to your doctor or health care professional before taking any of these medicines: -acetaminophen -aspirin -ibuprofen -ketoprofen -naproxen This list may not describe all possible interactions. Give your health care provider a list of all the medicines, herbs, non-prescription drugs, or dietary supplements you use. Also tell them if you smoke, drink alcohol, or use illegal drugs. Some items may interact with your medicine. What should I watch for while using this medicine? Visit your doctor for checks on your progress. This drug may make you feel generally unwell. This is not uncommon, as chemotherapy can affect healthy cells as well as cancer cells. Report any side effects. Continue your course of treatment even though you feel ill unless your doctor tells you to stop. In some cases, you may be given additional medicines to help with side effects. Follow all directions for their use. Call your  doctor or health care professional for advice if you get a fever, chills or sore throat, or other symptoms of a cold or flu. Do not treat yourself. This drug decreases your body's ability to fight infections. Try to avoid being around people who are sick. This medicine may increase your risk to bruise or bleed. Call your doctor or health care professional if you notice any unusual bleeding. Be careful brushing and flossing your teeth or using a toothpick because you may get an infection or bleed more easily. If you have any dental work done, tell your dentist you are receiving this medicine. Avoid taking products that contain aspirin, acetaminophen, ibuprofen, naproxen, or ketoprofen unless instructed by your doctor. These medicines may hide a fever. Women should inform their doctor if they wish to become pregnant or think they might be pregnant. There is a potential for serious side effects to an unborn child. Talk to your health care professional or pharmacist for more information. Do not breast-feed an infant while taking this medicine. What side effects may I notice from receiving this medicine? Side effects that you should report to your doctor or health care professional as soon as possible: -allergic reactions like skin rash, itching or hives, swelling of the face, lips, or tongue -low blood counts - this medicine may decrease the number of white blood cells, red blood cells and platelets. You may be at increased  risk for infections and bleeding. -signs of infection - fever or chills, cough, sore throat, pain or difficulty passing urine -signs of decreased platelets or bleeding - bruising, pinpoint red spots on the skin, black, tarry stools, blood in the urine -signs of decreased red blood cells - unusually weak or tired, fainting spells, lightheadedness -breathing problems -chest pain -mouth sores -nausea and vomiting -pain, swelling, redness at site where injected -pain, tingling, numbness  in the hands or feet -stomach pain -swelling of ankles, feet, hands -unusual bleeding Side effects that usually do not require medical attention (report to your doctor or health care professional if they continue or are bothersome): -constipation -diarrhea -hair loss -loss of appetite -stomach upset This list may not describe all possible side effects. Call your doctor for medical advice about side effects. You may report side effects to FDA at 1-800-FDA-1088. Where should I keep my medicine? This drug is given in a hospital or clinic and will not be stored at home. NOTE: This sheet is a summary. It may not cover all possible information. If you have questions about this medicine, talk to your doctor, pharmacist, or health care provider.  2018 [employer]/Gold Standard (2007-12-17 18:45:54)

## 2017-07-25 NOTE — Progress Notes (Signed)
  Oncology Nurse Navigator Documentation  Navigator Location: [facility] (07/25/17 1556)   )Navigator Encounter Type: Treatment (07/25/17 1556)     Confirmed Diagnosis Date: 07/06/17 (07/25/17 1556)             Treatment Initiated Date: 07/25/17 (07/25/17 1556)   Treatment Phase: First Chemo Tx (07/25/17 1556) Barriers/Navigation Needs: No Questions;No Needs (07/25/17 1556)   Interventions: Psycho-social support (07/25/17 1556)  I met with patient during her first chemo treatment in the infusion room. Patient positive and upbeat. Encouragement and support provided. Patient verbalized understanding that she can call with questions or concerns.          Acuity: Level 2 (07/25/17 1556)   Acuity Level 2: Ongoing guidance and education throughout treatment as needed (07/25/17 1556)     Time Spent with Patient: 15 (07/25/17 1556)

## 2017-07-25 NOTE — Progress Notes (Signed)
[facility]  [phone] [phone]  Clinic Follow up Note   Patient Care Team: Wilford Corner, MD as PCP - General (Gastroenterology) Truitt Merle, MD as Consulting Physician (Hematology) 07/25/2017  CHIEF COMPLAINT: F/u metastatic pancreas cancer   SUMMARY OF ONCOLOGIC HISTORY:   Pancreatic cancer metastasized to liver ([place])   06/21/2017 Imaging    CT CAP IMPRESSION: 1. Indistinct low-attenuation mass in the uncinate process of the pancreas with dilatation of remainder of the pancreatic duct and some encasement of the SMA. These findings are highly indicative of pancreatic carcinoma. Recommend MRI to assess further. 2. Multiple low-attenuation lesions throughout the right and left lobes of liver most consistent with liver metastasis again MRI may be helpful. 3. Moderate change of abdominal aortic atherosclerosis. 4. Changes of centrilobular and paraseptal emphysema on CT of the chest. No lung lesion is seen.       07/06/2017 Initial Biopsy    Diagnosis Liver, needle/core biopsy, Right lobe - METASTATIC ADENOCARCINOMA.      07/10/2017 Initial Diagnosis    Pancreatic cancer metastasized to liver [facility])     CURRENT THERAPY: Pending palliative abraxane/gemcitabine D1,8,15 q28 days   INTERVAL HISTORY: Ms. [name] returns for f/u as scheduled prior to first cycle gem/abraxane. In the interim she has port placed and attended chemo class. Diarrhea is improved, well managed with 2 imodium daily; she continues creon. Has 1 loose BM per day with intermittent gas. Appetite has increased with remeron. She had cold symptoms of productive cough with clear sputum and nasal congestion last week that are resolved today, only has lingering dry cough today. She otherwise has no specific complaints.   REVIEW OF SYSTEMS:   Constitutional: Denies fevers, chills or abnormal weight loss (+) mild fatigue (+) stable weight (+) improved appetite on remeron Eyes:  Denies blurriness of vision Ears, nose, mouth, throat, and face: Denies mucositis or sore throat Respiratory: Denies dyspnea or wheezes (+) dry cough Cardiovascular: Denies palpitation, chest discomfort or lower extremity swelling Gastrointestinal:  Denies nausea, vomiting, constipation, diarrhea, heartburn or change in bowel habits (+) 1 loose BM per day, controlled with imodium (+) gas Skin: Denies abnormal skin rashes Lymphatics: Denies new lymphadenopathy or easy bruising Neurological:Denies numbness, tingling or new weaknesses Behavioral/Psych: Mood is stable, no new changes  All other systems were reviewed with the patient and are negative.  MEDICAL HISTORY:  Past Medical History:  Diagnosis Date  . Anxiety     SURGICAL HISTORY: Past Surgical History:  Procedure Laterality Date  . IR FLUORO GUIDE PORT INSERTION RIGHT  07/24/2017  . IR US GUIDE VASC ACCESS RIGHT  07/24/2017    I have reviewed the social history and family history with the patient and they are unchanged from previous note.  ALLERGIES:  has No Known Allergies.  MEDICATIONS:  Current Outpatient Medications  Medication Sig Dispense Refill  . lidocaine-prilocaine (EMLA) cream Apply to affected area once 30 g 3  . LORazepam (ATIVAN) 1 MG tablet Take 1 mg 2 (two) times daily by mouth.    . mirtazapine (REMERON) 15 MG tablet Take 1 tablet (15 mg total) by mouth at bedtime. 30 tablet 3  . Multiple Vitamin (MULTIVITAMIN WITH MINERALS) TABS tablet Take 1 tablet daily by mouth. One-A-Day for Women    . Pancrelipase, Lip-Prot-Amyl, (CREON) 24000-76000 units CPEP Take 1 capsule (24,000 Units total) 3 (three) times daily before meals by mouth. 180 capsule 2  . ondansetron (ZOFRAN) 8 MG tablet Take 1 tablet (8 mg total)  by mouth 2 (two) times daily as needed (Nausea or vomiting). (Patient not taking: Reported on 07/25/2017) 30 tablet 1  . prochlorperazine (COMPAZINE) 10 MG tablet Take 1 tablet (10 mg total) by mouth every 6  (six) hours as needed (Nausea or vomiting). (Patient not taking: Reported on 07/25/2017) 30 tablet 1   No current facility-administered medications for this visit.    Facility-Administered Medications Ordered in Other Visits  Medication Dose Route Frequency Provider Last Rate Last Dose  . sodium chloride flush (NS) 0.9 % injection 10 mL  10 mL Intracatheter PRN Truitt Merle, MD   10 mL at 07/25/17 1708    PHYSICAL EXAMINATION: ECOG PERFORMANCE STATUS:  BP 113/60 (BP Location: Right Arm, Patient Position: Sitting)   Pulse 84   Temp 98.2 F (36.8 C) (Oral)   Resp 18   Ht 5\' 4"  (1.626 m)   Wt 89 lb 3.2 oz (40.5 kg)   SpO2 100%   BMI 15.31 kg/m   GENERAL:alert, thin female in no distress and comfortable SKIN: skin color, texture, turgor are normal, no rashes or significant lesions EYES: normal, Conjunctiva are pink and non-injected, sclera clear OROPHARYNX:no exudate, no erythema and lips, buccal mucosa, and tongue normal  NECK: supple, thyroid normal size, non-tender, without nodularity LYMPH:  no palpable cervical, supraclavicular, axillary lymphadenopathy  LUNGS: clear to auscultation bilaterally with normal breathing effort HEART: regular rate & rhythm and no murmurs and no lower extremity edema ABDOMEN:abdomen soft, non-tender and normal bowel sounds (+) nontender epigastric pulsation Musculoskeletal:no cyanosis of digits and no clubbing  NEURO: alert & oriented x 3 with fluent speech, no focal motor/sensory deficits PAC without erythema  LABORATORY DATA:  I have reviewed the data as listed CBC Latest Ref Rng & Units 07/25/2017 07/24/2017 07/06/2017  WBC 3.9 - 10.3 10e3/uL 8.1 9.7 8.3  Hemoglobin 11.6 - 15.9 g/dL 11.7 13.9 12.9  Hematocrit 34.8 - 46.6 % 35.0 39.0 37.4  Platelets 145 - 400 10e3/uL 237 272 204     CMP Latest Ref Rng & Units 07/25/2017 07/24/2017  Glucose 70 - 140 mg/dl 125 86  BUN 7.0 - 26.0 mg/dL 13.5 13  Creatinine 0.6 - 1.1 mg/dL 0.8 0.79  Sodium 136 - 145  mEq/L 137 135  Potassium 3.5 - 5.1 mEq/L 3.6 3.3(L)  Chloride 101 - 111 mmol/L - 102  CO2 22 - 29 mEq/L 21(L) 25  Calcium 8.4 - 10.4 mg/dL 9.5 9.7  Total Protein 6.4 - 8.3 g/dL 7.3 -  Total Bilirubin 0.20 - 1.20 mg/dL 0.41 -  Alkaline Phos 40 - 150 U/L 59 -  AST 5 - 34 U/L 27 -  ALT 0 - 55 U/L 20 -    RADIOGRAPHIC STUDIES: I have personally reviewed the radiological images as listed and agreed with the findings in the report. Ir US Guide Vasc Access Right  Result Date: 07/24/2017 CLINICAL DATA:  Metastatic pancreas cancer to the liver EXAM: RIGHT INTERNAL JUGULAR SINGLE LUMEN POWER PORT CATHETER INSERTION Date:  12/4/201812/11/2016 3:39 pm Radiologist:  M. Daryll Brod, MD Guidance:  Ultrasound and fluoroscopic MEDICATIONS: Ancef 2 g; The antibiotic was administered within an appropriate time interval prior to skin puncture. ANESTHESIA/SEDATION: Versed 2.0 mg IV; Fentanyl 100 mcg IV; Moderate Sedation Time:  25 The patient was continuously monitored during the procedure by the interventional radiology nurse under my direct supervision. FLUOROSCOPY TIME:  24 seconds (1.0 mGy) COMPLICATIONS: None immediate. CONTRAST:  None. PROCEDURE: Informed consent was obtained from the patient following explanation of the  procedure, risks, benefits and alternatives. The patient understands, agrees and consents for the procedure. All questions were addressed. A time out was performed. Maximal barrier sterile technique utilized including caps, mask, sterile gowns, sterile gloves, large sterile drape, hand hygiene, and 2% chlorhexidine scrub. Under sterile conditions and local anesthesia, right internal jugular micropuncture venous access was performed. Access was performed with ultrasound. Images were obtained for documentation. A guide wire was inserted followed by a transitional dilator. This allowed insertion of a guide wire and catheter into the IVC. Measurements were obtained from the SVC / RA junction back to  the right IJ venotomy site. In the right infraclavicular chest, a subcutaneous pocket was created over the second anterior rib. This was done under sterile conditions and local anesthesia. 1% lidocaine with epinephrine was utilized for this. A 2.5 cm incision was made in the skin. Blunt dissection was performed to create a subcutaneous pocket over the right pectoralis major muscle. The pocket was flushed with saline vigorously. There was adequate hemostasis. The port catheter was assembled and checked for leakage. The port catheter was secured in the pocket with two retention sutures. The tubing was tunneled subcutaneously to the right venotomy site and inserted into the SVC/RA junction through a valved peel-away sheath. Position was confirmed with fluoroscopy. Images were obtained for documentation. The patient tolerated the procedure well. No immediate complications. Incisions were closed in a two layer fashion with 4 - 0 Vicryl suture. Dermabond was applied to the skin. The port catheter was accessed, blood was aspirated followed by saline and heparin flushes. Needle was removed. A dry sterile dressing was applied. IMPRESSION: Ultrasound and fluoroscopically guided right internal jugular single lumen power port catheter insertion. Tip in the SVC/RA junction. Catheter ready for use. Electronically Signed   By: Jerilynn Mages.  Shick M.D.   On: 07/24/2017 15:47   Ir Fluoro Guide Port Insertion Right  Result Date: 07/24/2017 CLINICAL DATA:  Metastatic pancreas cancer to the liver EXAM: RIGHT INTERNAL JUGULAR SINGLE LUMEN POWER PORT CATHETER INSERTION Date:  12/4/201812/11/2016 3:39 pm Radiologist:  M. Daryll Brod, MD Guidance:  Ultrasound and fluoroscopic MEDICATIONS: Ancef 2 g; The antibiotic was administered within an appropriate time interval prior to skin puncture. ANESTHESIA/SEDATION: Versed 2.0 mg IV; Fentanyl 100 mcg IV; Moderate Sedation Time:  25 The patient was continuously monitored during the procedure by the  interventional radiology nurse under my direct supervision. FLUOROSCOPY TIME:  24 seconds (1.0 mGy) COMPLICATIONS: None immediate. CONTRAST:  None. PROCEDURE: Informed consent was obtained from the patient following explanation of the procedure, risks, benefits and alternatives. The patient understands, agrees and consents for the procedure. All questions were addressed. A time out was performed. Maximal barrier sterile technique utilized including caps, mask, sterile gowns, sterile gloves, large sterile drape, hand hygiene, and 2% chlorhexidine scrub. Under sterile conditions and local anesthesia, right internal jugular micropuncture venous access was performed. Access was performed with ultrasound. Images were obtained for documentation. A guide wire was inserted followed by a transitional dilator. This allowed insertion of a guide wire and catheter into the IVC. Measurements were obtained from the SVC / RA junction back to the right IJ venotomy site. In the right infraclavicular chest, a subcutaneous pocket was created over the second anterior rib. This was done under sterile conditions and local anesthesia. 1% lidocaine with epinephrine was utilized for this. A 2.5 cm incision was made in the skin. Blunt dissection was performed to create a subcutaneous pocket over the right pectoralis major muscle. The pocket  was flushed with saline vigorously. There was adequate hemostasis. The port catheter was assembled and checked for leakage. The port catheter was secured in the pocket with two retention sutures. The tubing was tunneled subcutaneously to the right venotomy site and inserted into the SVC/RA junction through a valved peel-away sheath. Position was confirmed with fluoroscopy. Images were obtained for documentation. The patient tolerated the procedure well. No immediate complications. Incisions were closed in a two layer fashion with 4 - 0 Vicryl suture. Dermabond was applied to the skin. The port catheter was  accessed, blood was aspirated followed by saline and heparin flushes. Needle was removed. A dry sterile dressing was applied. IMPRESSION: Ultrasound and fluoroscopically guided right internal jugular single lumen power port catheter insertion. Tip in the SVC/RA junction. Catheter ready for use. Electronically Signed   By: Jerilynn Mages.  Shick M.D.   On: 07/24/2017 15:47     ASSESSMENT & PLAN: 67 year old female with history of generalized anxiety disorder and 35 pounds weight loss over 6 months with decreased appetite and diarrhea. Liver biopsy confirms metastatic pancreas malignancy.   1. Metastatic pancreatic adenocarcinoma with metastasis to liver 2. Weight loss, decreased appetite 3. Diarrhea 4. Smoking cessation 5. Epigastric pulsation  Ms. Sevin appears stable today. She attended chemo class and had PAC placed. She has anti-emetics at home. Weight and VS stable. Labs reviewed, CBC and Cmet unremarkable and adequate for treatment. Nontender epigastric pulsation likely is the abdominal aorta pulsating in upper abdomen, which is prominent in this thin female; no AAA is seen on 06/21/17 CT. Physical exam otherwise unremarkable. She will proceed with cycle 1 day 1 gem/abraxane, will return in 1 week for day 8, next f/u with day 15. We discussed sign/symptoms that should prompt her to call clinic sooner, including fever, chills, respiratory changes, uncontrolled n/v or diarrhea, dehydration, or severe fatigue. We discussed smoking cessation, she has not seen PCP yet and plans to discuss chantix at next appointment; she continues to smoke.   PLAN:  Labs reviewed, proceed with cycle 1 day 1 gem/abraxane Return in 1 week for day 8 Next f/u with day 15  All questions were answered. The patient knows to call the clinic with any problems, questions or concerns. No barriers to learning was detected.    Alla Feeling, NP 07/25/17

## 2017-07-25 NOTE — Telephone Encounter (Signed)
Scheduled appt per 12/5 los - Gave patient AVS and calender per los.   

## 2017-08-01 ENCOUNTER — Ambulatory Visit (HOSPITAL_BASED_OUTPATIENT_CLINIC_OR_DEPARTMENT_OTHER): Payer: Medicare Other

## 2017-08-01 ENCOUNTER — Ambulatory Visit: Payer: Medicare Other

## 2017-08-01 ENCOUNTER — Other Ambulatory Visit: Payer: Self-pay | Admitting: Hematology

## 2017-08-01 ENCOUNTER — Ambulatory Visit: Payer: Medicare Other | Admitting: Nutrition

## 2017-08-01 ENCOUNTER — Other Ambulatory Visit (HOSPITAL_BASED_OUTPATIENT_CLINIC_OR_DEPARTMENT_OTHER): Payer: Medicare Other

## 2017-08-01 VITALS — BP 113/69 | HR 94 | Temp 98.5°F | Resp 17 | Ht 64.0 in | Wt 91.5 lb

## 2017-08-01 DIAGNOSIS — C259 Malignant neoplasm of pancreas, unspecified: Secondary | ICD-10-CM

## 2017-08-01 DIAGNOSIS — C787 Secondary malignant neoplasm of liver and intrahepatic bile duct: Principal | ICD-10-CM

## 2017-08-01 DIAGNOSIS — Z5111 Encounter for antineoplastic chemotherapy: Secondary | ICD-10-CM | POA: Diagnosis not present

## 2017-08-01 DIAGNOSIS — Z7189 Other specified counseling: Secondary | ICD-10-CM

## 2017-08-01 LAB — CBC WITH DIFFERENTIAL/PLATELET
BASO%: 1 % (ref 0.0–2.0)
BASOS ABS: 0 10*3/uL (ref 0.0–0.1)
EOS ABS: 0 10*3/uL (ref 0.0–0.5)
EOS%: 1 % (ref 0.0–7.0)
HCT: 30.2 % — ABNORMAL LOW (ref 34.8–46.6)
HGB: 10.3 g/dL — ABNORMAL LOW (ref 11.6–15.9)
LYMPH%: 37.8 % (ref 14.0–49.7)
MCH: 31.3 pg (ref 25.1–34.0)
MCHC: 34.1 g/dL (ref 31.5–36.0)
MCV: 91.8 fL (ref 79.5–101.0)
MONO#: 0.1 10*3/uL (ref 0.1–0.9)
MONO%: 4.5 % (ref 0.0–14.0)
NEUT#: 1.6 10*3/uL (ref 1.5–6.5)
NEUT%: 55.7 % (ref 38.4–76.8)
Platelets: 99 10*3/uL — ABNORMAL LOW (ref 145–400)
RBC: 3.29 10*6/uL — AB (ref 3.70–5.45)
RDW: 12.7 % (ref 11.2–14.5)
WBC: 2.9 10*3/uL — ABNORMAL LOW (ref 3.9–10.3)
lymph#: 1.1 10*3/uL (ref 0.9–3.3)

## 2017-08-01 LAB — COMPREHENSIVE METABOLIC PANEL
ALK PHOS: 72 U/L (ref 40–150)
ALT: 40 U/L (ref 0–55)
AST: 37 U/L — AB (ref 5–34)
Albumin: 3.5 g/dL (ref 3.5–5.0)
Anion Gap: 10 mEq/L (ref 3–11)
BUN: 10.9 mg/dL (ref 7.0–26.0)
CHLORIDE: 102 meq/L (ref 98–109)
CO2: 25 meq/L (ref 22–29)
Calcium: 9.6 mg/dL (ref 8.4–10.4)
Creatinine: 0.8 mg/dL (ref 0.6–1.1)
GLUCOSE: 137 mg/dL (ref 70–140)
POTASSIUM: 3.8 meq/L (ref 3.5–5.1)
SODIUM: 137 meq/L (ref 136–145)
Total Bilirubin: 0.3 mg/dL (ref 0.20–1.20)
Total Protein: 7 g/dL (ref 6.4–8.3)

## 2017-08-01 MED ORDER — SODIUM CHLORIDE 0.9 % IV SOLN
800.0000 mg/m2 | Freq: Once | INTRAVENOUS | Status: AC
  Administered 2017-08-01: 1064 mg via INTRAVENOUS
  Filled 2017-08-01: qty 27.98

## 2017-08-01 MED ORDER — PACLITAXEL PROTEIN-BOUND CHEMO INJECTION 100 MG
125.0000 mg/m2 | Freq: Once | Status: AC
  Administered 2017-08-01: 175 mg via INTRAVENOUS
  Filled 2017-08-01: qty 35

## 2017-08-01 MED ORDER — PROCHLORPERAZINE MALEATE 10 MG PO TABS
10.0000 mg | ORAL_TABLET | Freq: Once | ORAL | Status: AC
  Administered 2017-08-01: 10 mg via ORAL

## 2017-08-01 MED ORDER — SODIUM CHLORIDE 0.9% FLUSH
10.0000 mL | Freq: Once | INTRAVENOUS | Status: AC
  Administered 2017-08-01: 10 mL
  Filled 2017-08-01: qty 10

## 2017-08-01 MED ORDER — PROCHLORPERAZINE MALEATE 10 MG PO TABS
ORAL_TABLET | ORAL | Status: AC
  Filled 2017-08-01: qty 1

## 2017-08-01 MED ORDER — SODIUM CHLORIDE 0.9% FLUSH
10.0000 mL | INTRAVENOUS | Status: DC | PRN
  Administered 2017-08-01: 10 mL
  Filled 2017-08-01: qty 10

## 2017-08-01 MED ORDER — HEPARIN SOD (PORK) LOCK FLUSH 100 UNIT/ML IV SOLN
500.0000 [IU] | Freq: Once | INTRAVENOUS | Status: AC | PRN
  Administered 2017-08-01: 500 [IU]
  Filled 2017-08-01: qty 5

## 2017-08-01 MED ORDER — SODIUM CHLORIDE 0.9 % IV SOLN
Freq: Once | INTRAVENOUS | Status: AC
  Administered 2017-08-01: 13:00:00 via INTRAVENOUS

## 2017-08-01 NOTE — Patient Instructions (Addendum)
[facility] Discharge Instructions for Patients Receiving Chemotherapy  Today you received the following chemotherapy agents: Paclitaxel-protein (Abraxane) and Gemcitabine (Gemzar).  To help prevent nausea and vomiting after your treatment, we encourage you to take your nausea medication as prescribed.  If you develop nausea and vomiting that is not controlled by your nausea medication, call the clinic.   BELOW ARE SYMPTOMS THAT SHOULD BE REPORTED IMMEDIATELY:  *FEVER GREATER THAN 100.5 F  *CHILLS WITH OR WITHOUT FEVER  NAUSEA AND VOMITING THAT IS NOT CONTROLLED WITH YOUR NAUSEA MEDICATION  *UNUSUAL SHORTNESS OF BREATH  *UNUSUAL BRUISING OR BLEEDING  TENDERNESS IN MOUTH AND THROAT WITH OR WITHOUT PRESENCE OF ULCERS  *URINARY PROBLEMS  *BOWEL PROBLEMS  UNUSUAL RASH Items with * indicate a potential emergency and should be followed up as soon as possible.  Feel free to call the clinic should you have any questions or concerns. The clinic phone number is [phone].  Please show the CHEMO ALERT CARD at check-in to the Emergency Department and triage nurse.   Neutropenia Neutropenia is a condition that occurs when you have a lower-than-normal level of a type of white blood cell (neutrophil) in your body. Neutrophils are made in the spongy center of large bones (bone marrow) and they fight infections. Neutrophils are your body's main defense against bacterial and fungal infections. The fewer neutrophils you have and the longer your body remains without them, the greater your risk of getting a severe infection. What are the causes? This condition can occur if your body uses up or destroys neutrophils faster than your bone marrow can make them. This problem may happen because of:  Bacterial or fungal infection.  Allergic disorders.  Reactions to some medicines.  Autoimmune disease.  An enlarged spleen.  This condition can also occur if your bone marrow  does not produce enough neutrophils. This problem may be caused by:  Cancer.  Cancer treatments, such as radiation or chemotherapy.  Viral infections.  Medicines, such as phenytoin.  Vitamin B12 deficiency.  Diseases of the bone marrow.  Environmental toxins, such as insecticides.  What are the signs or symptoms? This condition does not usually cause symptoms. If symptoms are present, they are usually caused by an underlying infection. Symptoms of an infection may include:  Fever.  Chills.  Swollen glands.  Oral or anal ulcers.  Cough and shortness of breath.  Rash.  Skin infection.  Fatigue.  How is this diagnosed? Your health care provider may suspect neutropenia if you have:  A condition that may cause neutropenia.  Symptoms of infection, especially fever.  Frequent and unusual infections.  You will have a medical history and physical exam. Tests will also be done, such as:  A complete blood count (CBC).  A procedure to collect a sample of bone marrow for examination (bone marrow biopsy).  A chest X-ray.  A urine culture.  A blood culture.  How is this treated? Treatment depends on the underlying cause and severity of your condition. Mild neutropenia may not require treatment. Treatment may include medicines, such as:  Antibiotic medicine given through an IV tube.  Antiviral medicines.  Antifungal medicines.  A medicine to increase neutrophil production (colony-stimulating factor). You may get this drug through an IV tube or by injection.  Steroids given through an IV tube.  If an underlying condition is causing neutropenia, you may need treatment for that condition. If medicines you are taking are causing neutropenia, your health care provider may have you   stop taking those medicines. Follow these instructions at home: Medicines  Take over-the-counter and prescription medicines only as told by your health care provider.  Get a seasonal  flu shot (influenza vaccine). Lifestyle  Do not eat unpasteurized foods.Do not eat unwashed raw fruits or vegetables.  Avoid exposure to groups of people or children.  Avoid being around people who are sick.  Avoid being around dirt or dust, such as in construction areas or gardens.  Do not provide direct care for pets. Avoid animal droppings. Do not clean litter boxes and bird cages. Hygiene   Bathe daily.  Clean the area between the genitals and the anus (perineal area) after you urinate or have a bowel movement. If you are female, wipe from front to back.  Brush your teeth with a soft toothbrush before and after meals.  Do not use a razor that has a blade. Use an electric razor to remove hair.  Wash your hands often. Make sure others who come in contact with you also wash their hands. If soap and water are not available, use hand sanitizer. General instructions  Do not have sex unless your health care provider has approved.  Take actions to avoid cuts and burns. For example: ? Be cautious when you use knives. Always cut away from yourself. ? Keep knives in protective sheaths or guards when not in use. ? Use oven mitts when you cook with a hot stove, oven, or grill. ? Stand a safe distance away from open fires.  Avoid people who received a vaccine in the past 30 days if that vaccine contained a live version of the germ (live vaccine). You should not get a live vaccine. Common live vaccines are varicella, measles, mumps, and rubella.  Do not share food utensils.  Do not use tampons, enemas, or rectal suppositories unless your health care provider has approved.  Keep all appointments as told by your health care provider. This is important. Contact a health care provider if:  You have a fever.  You have chills or you start to shake.  You have: ? A sore throat. ? A warm, red, or tender area on your skin. ? A cough. ? Frequent or painful urination. ? Vaginal discharge  or itching.  You develop: ? Sores in your mouth or anus. ? Swollen lymph nodes. ? Red streaks on the skin. ? A rash.  You feel: ? Nauseous or you vomit. ? Very fatigued. ? Short of breath. This information is not intended to replace advice given to you by your health care provider. Make sure you discuss any questions you have with your health care provider. Document Released: 01/27/2002 Document Revised: 01/13/2016 Document Reviewed: 02/17/2015 Elsevier Interactive Patient Education  [employer].

## 2017-08-01 NOTE — Progress Notes (Signed)
66 year old female diagnosed with metastatic pancreas cancer.  She is a patient of Dr. Burr Medico.  Past medical history includes anxiety.  Medications include Ativan, Remeron, multivitamin, Creon  Labs reviewed.  Height: 5 feet 4 inches. Weight: 91.5 pounds. Usual body weight: 135 pounds. BMI: 15.71.  Patient reports she drinks boost 3 times a day She states her appetite is slightly better on Remeron. Diarrhea has resolved.  Patient continues to take Imodium as needed. She reports she takes Creon at the beginning of her meal. Patient verbalizes desire to gain weight.  Nutrition diagnosis: Unintended weight loss related to metastatic pancreas cancer as evidenced by 32% weight loss from usual body weight.  Intervention: Patient was educated to continue boost +3 times a day between meals. Encouraged patient to consume high-calorie, high-protein foods at mealtimes. Explained reason for pancreatic enzymes and encouraged patient to take at the first bite of food at mealtimes and snacks. Educated patient on increasing calories and protein. Fact sheets were provided.  Questions were answered.  Teach back method used and contact information given.  Monitoring, evaluation, goals: Patient will tolerate increased calories and protein to minimize further weight loss.  Next visit: Friday, January 4 during infusion.  **Disclaimer: This note was dictated with voice recognition software. Similar sounding words can inadvertently be transcribed and this note may contain transcription errors which may not have been corrected upon publication of note.**

## 2017-08-01 NOTE — Progress Notes (Signed)
Per Dr. Burr Medico, [place] to treat with platelets of 99. Dr. Burr Medico will reduce the dose of Gemzar for today's treatment and add Nuelasta injection to end of next treatment. Plan explained to pt, as well as to monitor for S/S of bleeding and infection, and to avoid crowds and people with active illness. Pt verbalized agreement and understanding. Will continue to monitor.

## 2017-08-07 NOTE — Progress Notes (Signed)
[place]  [phone] [phone]) [phone]  Clinic Follow Up Note   Patient Care Team: Wilford Corner, MD as PCP - General (Gastroenterology) Truitt Merle, MD as Consulting Physician (Hematology)   Date of Service:  08/08/2017  CHIEF COMPLAINTS  Pancreatic with liver metastasis   HISTORY OF PRESENTING ILLNESS:  Tammy Adams 66 y.o. female is here because of her abnormal CT findings which is highly suspicious for metastatic pancreatic malignancy.  She was referred by her gastroenterologist Dr. Michail Sermon.  She presents to my clinic with her husband today.  She has been having diarrhea for 4 months, she has loose/watery BM after each meals, no pain, nausea, or bloating. She has had low appetite since then, she has been eating very little, she was also recommended to avoid dairy products by her primary care physician, per her husband, she has been taking boost 3 bottles a day lately, she has lost aobut 35 lbs, but has been stable lately since she is taking boosts.   She was initially referred by her primary care physician, and subsequently referred to GI Dr. Michail Sermon.  She underwent EGD and colonoscopy, which showed benign polyps and colon otherwise negative.,  CT chest, abdomen and pelvis was obtained on June 21, 2017, which showed a indistinct low-attenuation mass in the uncinate process of the pancreas with dilatation of pancreatic duct multiple, low-attenuation lesions throughout the right and left lobe of liver most consistent with liver metastasis.  She was referred to IR for liver biopsy this Friday.  She is otherwise healthy, retired from post office, still works part-time as a Oceanographer.  She lives with her husband, who is disabled.  She overall has mild fatigue, but functions very well at home.  No other complaints.   CURRENT THERAPY: first line Gemcitabine and Abraxane 2 weeks on and 1 week off started 07/25/17    INTERVAL HISTORY  Tammy  Adams is here for a follow up. She presents to the clinic today accompanied by her family member.  She started chemotherapy 2 weeks ago, and tolerated very well.  She denies any significant nausea, vomiting, diarrhea, or other symptoms.  She still has mild fatigue, able to function at home.  No fever, bleeding, or other new symptoms.   MEDICAL HISTORY:  Past Medical History:  Diagnosis Date  . Anxiety     SURGICAL HISTORY: Past Surgical History:  Procedure Laterality Date  . IR FLUORO GUIDE PORT INSERTION RIGHT  07/24/2017  . IR US GUIDE VASC ACCESS RIGHT  07/24/2017    SOCIAL HISTORY: Social History   Socioeconomic History  . Marital status: Married    Spouse name: Not on file  . Number of children: Not on file  . Years of education: Not on file  . Highest education level: Not on file  Social Needs  . Financial resource strain: Not on file  . Food insecurity - worry: Not on file  . Food insecurity - inability: Not on file  . Transportation needs - medical: Not on file  . Transportation needs - non-medical: Not on file  Occupational History  . Not on file  Tobacco Use  . Smoking status: Current Every Day Smoker    Packs/day: 0.50    Years: 50.00    Pack years: 25.00  . Smokeless tobacco: Never Used  Substance and Sexual Activity  . Alcohol use: No    Frequency: Never  . Drug use: Yes    Types: Marijuana  . Sexual activity: Not  on file  Other Topics Concern  . Not on file  Social History Narrative  . Not on file    FAMILY HISTORY: Family History  Problem Relation Age of Onset  . Cancer Father        prostate cancer   . Cancer Maternal Aunt 70       pancreatic cancer   . Cancer Maternal Grandmother        colon cancer     ALLERGIES:  has No Known Allergies.  MEDICATIONS:  Current Outpatient Medications  Medication Sig Dispense Refill  . lidocaine-prilocaine (EMLA) cream Apply to affected area once 30 g 3  . LORazepam (ATIVAN) 1 MG tablet Take 1 mg 2  (two) times daily by mouth.    . mirtazapine (REMERON) 15 MG tablet Take 1 tablet (15 mg total) by mouth at bedtime. 30 tablet 3  . Multiple Vitamin (MULTIVITAMIN WITH MINERALS) TABS tablet Take 1 tablet daily by mouth. One-A-Day for Women    . ondansetron (ZOFRAN) 8 MG tablet Take 1 tablet (8 mg total) by mouth 2 (two) times daily as needed (Nausea or vomiting). (Patient not taking: Reported on 07/25/2017) 30 tablet 1  . Pancrelipase, Lip-Prot-Amyl, (CREON) 24000-76000 units CPEP Take 1 capsule (24,000 Units total) 3 (three) times daily before meals by mouth. 180 capsule 2  . prochlorperazine (COMPAZINE) 10 MG tablet Take 1 tablet (10 mg total) by mouth every 6 (six) hours as needed (Nausea or vomiting). (Patient not taking: Reported on 07/25/2017) 30 tablet 1   No current facility-administered medications for this visit.    Facility-Administered Medications Ordered in Other Visits  Medication Dose Route Frequency Provider Last Rate Last Dose  . sodium chloride flush (NS) 0.9 % injection 10 mL  10 mL Intracatheter PRN Truitt Merle, MD   10 mL at 07/25/17 1708    REVIEW OF SYSTEMS:   Constitutional: Denies fevers, chills or abnormal night sweats, (+) 35 pound weight loss in the past 4 months, weight stable lately  Eyes: Denies blurriness of vision, double vision or watery eyes Ears, nose, mouth, throat, and face: Denies mucositis or sore throat Respiratory: Denies cough, dyspnea or wheezes Cardiovascular: Denies palpitation, chest discomfort or lower extremity swelling Gastrointestinal:  Denies nausea, heartburn, (+) diarrhea  Skin: Denies abnormal skin rashes Lymphatics: Denies new lymphadenopathy or easy bruising Neurological:Denies numbness, tingling or new weaknesses Behavioral/Psych: Mood is stable, no new changes  All other systems were reviewed with the patient and are negative.  PHYSICAL EXAMINATION: ECOG PERFORMANCE STATUS: 1 - Symptomatic but completely ambulatory  Vitals:    08/08/17 1149  BP: (!) 94/52  Pulse: 97  Resp: 20  Temp: 97.6 F (36.4 C)  SpO2: 100%   Filed Weights   08/08/17 1149  Weight: 89 lb 3.2 oz (40.5 kg)   GENERAL:alert, no distress and comfortable SKIN: skin color, texture, turgor are normal, no rashes or significant lesions EYES: normal, conjunctiva are pink and non-injected, sclera clear OROPHARYNX:no exudate, no erythema and lips, buccal mucosa, and tongue normal  NECK: supple, thyroid normal size, non-tender, without nodularity LYMPH:  no palpable lymphadenopathy in the cervical, axillary or inguinal LUNGS: clear to auscultation and percussion with normal breathing effort HEART: regular rate & rhythm and no murmurs and no lower extremity edema ABDOMEN:abdomen soft, non-tender and normal bowel sounds Musculoskeletal:no cyanosis of digits and no clubbing  PSYCH: alert & oriented x 3 with fluent speech NEURO: no focal motor/sensory deficits  LABORATORY DATA:  I have reviewed the data as  listed CBC Latest Ref Rng & Units 08/08/2017 08/01/2017 07/25/2017  WBC 3.9 - 10.3 10e3/uL 2.4(L) 2.9(L) 8.1  Hemoglobin 11.6 - 15.9 g/dL 9.8(L) 10.3(L) 11.7  Hematocrit 34.8 - 46.6 % 28.5(L) 30.2(L) 35.0  Platelets 145 - 400 10e3/uL 32(L) 99(L) 237   CMP Latest Ref Rng & Units 08/08/2017 08/01/2017 07/25/2017  Glucose 70 - 140 mg/dl 126 137 125  BUN 7.0 - 26.0 mg/dL 12.8 10.9 13.5  Creatinine 0.6 - 1.1 mg/dL 0.8 0.8 0.8  Sodium 136 - 145 mEq/L 135(L) 137 137  Potassium 3.5 - 5.1 mEq/L 3.5 3.8 3.6  Chloride 101 - 111 mmol/L - - -  CO2 22 - 29 mEq/L 25 25 21(L)  Calcium 8.4 - 10.4 mg/dL 9.3 9.6 9.5  Total Protein 6.4 - 8.3 g/dL 7.0 7.0 7.3  Total Bilirubin 0.20 - 1.20 mg/dL 0.38 0.30 0.41  Alkaline Phos 40 - 150 U/L 73 72 59  AST 5 - 34 U/L 32 37(H) 27  ALT 0 - 55 U/L 38 40 20    CA19.9: 1   PATHOLOGY: #1 biopsy from duodenum, distal stomach, GE junction no significant pathological changes, except reactive changes in stomach #2  transverse colon, hepatic flexure, sigmoid, polyps biopsy showed tubular adenomas (3)  Liver Biopsy  Diagnosis 07/06/17 Liver, needle/core biopsy, Right lobe - METASTATIC ADENOCARCINOMA. Microscopic Comment The tumor is positive for cytokeratin 7 and negative for cytokeratin 20, CDX-2 and TTF-1. The immunoprofile is non-specific, but the differential includes a upper GI or pancreaticobiliary primary among others. Dr. Lyndon Code has reviewed the case. Dr. Michail Sermon was paged on 07/10/2017.   PROCEDURES   EGD: 05/28/2017: Impression:  -Normal esophagus- -Z line irregular, 40 cm from the incisors, biopsied. -Acute gastritis, biopsied, -Normal examined duodenum, biopsied.  Colonoscopy: March 28, 2017 Impression -Multiple polyps, one 7 mm at the hepatic flexure, one 3 mm at flexure, one 5 mm in the transverse colon, one 4 mm in the sigmoid colon, one 14 mm in the sigmoid colon, all removed -Normal mucosa in the entire examined colon -Diverticulosis in the sigmoid colon -Internal hemorrhoids   RADIOGRAPHIC STUDIES: I have personally reviewed the radiological images as listed and agreed with the findings in the report. Ir US Guide Vasc Access Right  Result Date: 07/24/2017 CLINICAL DATA:  Metastatic pancreas cancer to the liver EXAM: RIGHT INTERNAL JUGULAR SINGLE LUMEN POWER PORT CATHETER INSERTION Date:  12/4/201812/11/2016 3:39 pm Radiologist:  M. Daryll Brod, MD Guidance:  Ultrasound and fluoroscopic MEDICATIONS: Ancef 2 g; The antibiotic was administered within an appropriate time interval prior to skin puncture. ANESTHESIA/SEDATION: Versed 2.0 mg IV; Fentanyl 100 mcg IV; Moderate Sedation Time:  25 The patient was continuously monitored during the procedure by the interventional radiology nurse under my direct supervision. FLUOROSCOPY TIME:  24 seconds (1.0 mGy) COMPLICATIONS: None immediate. CONTRAST:  None. PROCEDURE: Informed consent was obtained from the patient following explanation of  the procedure, risks, benefits and alternatives. The patient understands, agrees and consents for the procedure. All questions were addressed. A time out was performed. Maximal barrier sterile technique utilized including caps, mask, sterile gowns, sterile gloves, large sterile drape, hand hygiene, and 2% chlorhexidine scrub. Under sterile conditions and local anesthesia, right internal jugular micropuncture venous access was performed. Access was performed with ultrasound. Images were obtained for documentation. A guide wire was inserted followed by a transitional dilator. This allowed insertion of a guide wire and catheter into the IVC. Measurements were obtained from the SVC / RA junction back to the  right IJ venotomy site. In the right infraclavicular chest, a subcutaneous pocket was created over the second anterior rib. This was done under sterile conditions and local anesthesia. 1% lidocaine with epinephrine was utilized for this. A 2.5 cm incision was made in the skin. Blunt dissection was performed to create a subcutaneous pocket over the right pectoralis major muscle. The pocket was flushed with saline vigorously. There was adequate hemostasis. The port catheter was assembled and checked for leakage. The port catheter was secured in the pocket with two retention sutures. The tubing was tunneled subcutaneously to the right venotomy site and inserted into the SVC/RA junction through a valved peel-away sheath. Position was confirmed with fluoroscopy. Images were obtained for documentation. The patient tolerated the procedure well. No immediate complications. Incisions were closed in a two layer fashion with 4 - 0 Vicryl suture. Dermabond was applied to the skin. The port catheter was accessed, blood was aspirated followed by saline and heparin flushes. Needle was removed. A dry sterile dressing was applied. IMPRESSION: Ultrasound and fluoroscopically guided right internal jugular single lumen power port  catheter insertion. Tip in the SVC/RA junction. Catheter ready for use. Electronically Signed   By: Jerilynn Mages.  Shick M.D.   On: 07/24/2017 15:47   Ir Fluoro Guide Port Insertion Right  Result Date: 07/24/2017 CLINICAL DATA:  Metastatic pancreas cancer to the liver EXAM: RIGHT INTERNAL JUGULAR SINGLE LUMEN POWER PORT CATHETER INSERTION Date:  12/4/201812/11/2016 3:39 pm Radiologist:  M. Daryll Brod, MD Guidance:  Ultrasound and fluoroscopic MEDICATIONS: Ancef 2 g; The antibiotic was administered within an appropriate time interval prior to skin puncture. ANESTHESIA/SEDATION: Versed 2.0 mg IV; Fentanyl 100 mcg IV; Moderate Sedation Time:  25 The patient was continuously monitored during the procedure by the interventional radiology nurse under my direct supervision. FLUOROSCOPY TIME:  24 seconds (1.0 mGy) COMPLICATIONS: None immediate. CONTRAST:  None. PROCEDURE: Informed consent was obtained from the patient following explanation of the procedure, risks, benefits and alternatives. The patient understands, agrees and consents for the procedure. All questions were addressed. A time out was performed. Maximal barrier sterile technique utilized including caps, mask, sterile gowns, sterile gloves, large sterile drape, hand hygiene, and 2% chlorhexidine scrub. Under sterile conditions and local anesthesia, right internal jugular micropuncture venous access was performed. Access was performed with ultrasound. Images were obtained for documentation. A guide wire was inserted followed by a transitional dilator. This allowed insertion of a guide wire and catheter into the IVC. Measurements were obtained from the SVC / RA junction back to the right IJ venotomy site. In the right infraclavicular chest, a subcutaneous pocket was created over the second anterior rib. This was done under sterile conditions and local anesthesia. 1% lidocaine with epinephrine was utilized for this. A 2.5 cm incision was made in the skin. Blunt  dissection was performed to create a subcutaneous pocket over the right pectoralis major muscle. The pocket was flushed with saline vigorously. There was adequate hemostasis. The port catheter was assembled and checked for leakage. The port catheter was secured in the pocket with two retention sutures. The tubing was tunneled subcutaneously to the right venotomy site and inserted into the SVC/RA junction through a valved peel-away sheath. Position was confirmed with fluoroscopy. Images were obtained for documentation. The patient tolerated the procedure well. No immediate complications. Incisions were closed in a two layer fashion with 4 - 0 Vicryl suture. Dermabond was applied to the skin. The port catheter was accessed, blood was aspirated followed by saline and  heparin flushes. Needle was removed. A dry sterile dressing was applied. IMPRESSION: Ultrasound and fluoroscopically guided right internal jugular single lumen power port catheter insertion. Tip in the SVC/RA junction. Catheter ready for use. Electronically Signed   By: Jerilynn Mages.  Shick M.D.   On: 07/24/2017 15:47    ASSESSMENT & PLAN: 66 year old African-American female, with past medical history of anxiety, otherwise healthy, presented with chronic diarrhea for 4 months, poor appetite and 35lbs weight loss.  CT scan showed a pancreatic head mass and multiple liver metastasis.  1. 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 she has diffuse liver metastases in both lobes. -Her liver biopsy confirmed metastatic adenocarcinoma, consistent with pancreatic primary -We previously discussed her malignancy is not curable at this stage, but treatable. -She has started first-line chemotherapy with gemcitabine and Abraxane, she  tolerated the first cycle week 1 and 2 very well, with minimal side effects. -Lab reviewed, she has developed worsening pancytopenia, platelet  count dropped to 32,000 today, despite gemcitabine dose reduction last week.  We will cancel her today's cycle 1 day 15 treatment, and change her chemotherapy to 2 weeks on and one-week off with dose reduction of gemcitabine to 800 mg/m. -We again discussed the signs of bleeding and infection, she knows to watch.  She will return in 2 days to repeat her CBC, to see if she needs platelet transfusion if platelet count less than 20K -Due to the holiday, she will start cycle 2 chemotherapy in 2 weeks.   2. Diarrhea  -Probable related to her pancreatic malignancy -continue creon -She has been advised to take imodium along with her creon. Dosing instruction were reviewed.  -overall improved   3.  Anorexia and weight loss -She has been drinking boost 3 bottles a day, which has been stabilized lately -She has used marijuana in the past, still has some at home, she will try marijuana for her anorexia. -I advised her to ween of Ativan to once daily.  -She was prescitbed Mirtazapine on 07/11/17 to help with her appetite.  -she is eating better, weight stable lately   4. Smoking Cessation  -She is interested in quitting smoking, we encouraged her. She does not want to try nicotine products. She is asking about Chantix, has apt with PCP next week, recommended she discuss this with PCP.      Plan  -Lab reviewed, due to the pancytopenia, especially severe thrombocytopenia, will cancel today's chemo  -Change his gemcitabine and Abraxane to 2 weeks on, and one-week off with dose reduction of gemcitabine to 800 mg/m -Lab in 2 days, will arrange platelet transfusion if platelets less than 20 K -She will return in 2 weeks to start cycle 2 chemotherapy   All questions were answered. The patient knows to call the clinic with any problems, questions or  concerns.  I spent 20 minutes counseling the patient face to face. The total time spent in the appointment was 25 minutes and more than 50% was on counseling.     Truitt Merle, MD 08/08/2017

## 2017-08-08 ENCOUNTER — Other Ambulatory Visit (HOSPITAL_BASED_OUTPATIENT_CLINIC_OR_DEPARTMENT_OTHER): Payer: Medicare Other

## 2017-08-08 ENCOUNTER — Ambulatory Visit (HOSPITAL_BASED_OUTPATIENT_CLINIC_OR_DEPARTMENT_OTHER): Payer: Medicare Other | Admitting: Hematology

## 2017-08-08 ENCOUNTER — Ambulatory Visit (HOSPITAL_BASED_OUTPATIENT_CLINIC_OR_DEPARTMENT_OTHER): Payer: Medicare Other

## 2017-08-08 ENCOUNTER — Telehealth: Payer: Self-pay | Admitting: Hematology

## 2017-08-08 ENCOUNTER — Ambulatory Visit: Payer: Medicare Other

## 2017-08-08 VITALS — BP 100/58 | HR 88 | Temp 98.4°F | Resp 18

## 2017-08-08 VITALS — BP 94/52 | HR 97 | Temp 97.6°F | Resp 20 | Ht 64.0 in | Wt 89.2 lb

## 2017-08-08 DIAGNOSIS — R197 Diarrhea, unspecified: Secondary | ICD-10-CM | POA: Diagnosis not present

## 2017-08-08 DIAGNOSIS — C259 Malignant neoplasm of pancreas, unspecified: Secondary | ICD-10-CM

## 2017-08-08 DIAGNOSIS — C787 Secondary malignant neoplasm of liver and intrahepatic bile duct: Secondary | ICD-10-CM

## 2017-08-08 DIAGNOSIS — R634 Abnormal weight loss: Secondary | ICD-10-CM | POA: Diagnosis not present

## 2017-08-08 LAB — COMPREHENSIVE METABOLIC PANEL
ALT: 38 U/L (ref 0–55)
AST: 32 U/L (ref 5–34)
Albumin: 3.3 g/dL — ABNORMAL LOW (ref 3.5–5.0)
Alkaline Phosphatase: 73 U/L (ref 40–150)
Anion Gap: 9 mEq/L (ref 3–11)
BUN: 12.8 mg/dL (ref 7.0–26.0)
CALCIUM: 9.3 mg/dL (ref 8.4–10.4)
CHLORIDE: 102 meq/L (ref 98–109)
CO2: 25 mEq/L (ref 22–29)
CREATININE: 0.8 mg/dL (ref 0.6–1.1)
EGFR: >60 mL/min/{1.73_m2} (ref >60)
Glucose: 126 mg/dl (ref 70–140)
POTASSIUM: 3.5 meq/L (ref 3.5–5.1)
Sodium: 135 mEq/L — ABNORMAL LOW (ref 136–145)
Total Bilirubin: 0.38 mg/dL (ref 0.20–1.20)
Total Protein: 7 g/dL (ref 6.4–8.3)

## 2017-08-08 LAB — CBC WITH DIFFERENTIAL/PLATELET
BASO%: 0 % (ref 0.0–2.0)
BASOS ABS: 0 10*3/uL (ref 0.0–0.1)
EOS%: 0.8 % (ref 0.0–7.0)
Eosinophils Absolute: 0 10*3/uL (ref 0.0–0.5)
HEMATOCRIT: 28.5 % — AB (ref 34.8–46.6)
HGB: 9.8 g/dL — ABNORMAL LOW (ref 11.6–15.9)
LYMPH#: 1 10*3/uL (ref 0.9–3.3)
LYMPH%: 39.7 % (ref 14.0–49.7)
MCH: 31.4 pg (ref 25.1–34.0)
MCHC: 34.4 g/dL (ref 31.5–36.0)
MCV: 91.3 fL (ref 79.5–101.0)
MONO#: 0.1 10*3/uL (ref 0.1–0.9)
MONO%: 5.9 % (ref 0.0–14.0)
NEUT#: 1.3 10*3/uL — ABNORMAL LOW (ref 1.5–6.5)
NEUT%: 53.6 % (ref 38.4–76.8)
PLATELETS: 32 10*3/uL — AB (ref 145–400)
RBC: 3.12 10*6/uL — ABNORMAL LOW (ref 3.70–5.45)
RDW: 12.6 % (ref 11.2–14.5)
WBC: 2.4 10*3/uL — ABNORMAL LOW (ref 3.9–10.3)

## 2017-08-08 MED ORDER — SODIUM CHLORIDE 0.9% FLUSH
10.0000 mL | Freq: Once | INTRAVENOUS | Status: AC
  Administered 2017-08-08: 10 mL
  Filled 2017-08-08: qty 10

## 2017-08-08 MED ORDER — SODIUM CHLORIDE 0.9 % IV SOLN
Freq: Once | INTRAVENOUS | Status: AC
  Administered 2017-08-08: 14:00:00 via INTRAVENOUS

## 2017-08-08 MED ORDER — DRONABINOL 2.5 MG PO CAPS: 2.5000 mg | ORAL_CAPSULE | Freq: Two times a day (BID) | ORAL | 1 refills | Status: DC

## 2017-08-08 MED ORDER — HEPARIN SOD (PORK) LOCK FLUSH 100 UNIT/ML IV SOLN
500.0000 [IU] | Freq: Once | INTRAVENOUS | Status: AC
  Administered 2017-08-08: 500 [IU]
  Filled 2017-08-08: qty 5

## 2017-08-08 NOTE — Patient Instructions (Signed)
Dehydration, Adult Dehydration is when there is not enough fluid or water in your body. This happens when you lose more fluids than you take in. Dehydration can range from mild to very bad. It should be treated right away to keep it from getting very bad. Symptoms of mild dehydration may include:  Thirst.  Dry lips.  Slightly dry mouth.  Dry, warm skin.  Dizziness. Symptoms of moderate dehydration may include:  Very dry mouth.  Muscle cramps.  Dark pee (urine). Pee may be the color of tea.  Your body making less pee.  Your eyes making fewer tears.  Heartbeat that is uneven or faster than normal (palpitations).  Headache.  Light-headedness, especially when you stand up from sitting.  Fainting (syncope). Symptoms of very bad dehydration may include:  Changes in skin, such as: ? Cold and clammy skin. ? Blotchy (mottled) or pale skin. ? Skin that does not quickly return to normal after being lightly pinched and let go (poor skin turgor).  Changes in body fluids, such as: ? Feeling very thirsty. ? Your eyes making fewer tears. ? Not sweating when body temperature is high, such as in hot weather. ? Your body making very little pee.  Changes in vital signs, such as: ? Weak pulse. ? Pulse that is more than 100 beats a minute when you are sitting still. ? Fast breathing. ? Low blood pressure.  Other changes, such as: ? Sunken eyes. ? Cold hands and feet. ? Confusion. ? Lack of energy (lethargy). ? Trouble waking up from sleep. ? Short-term weight loss. ? Unconsciousness. Follow these instructions at home:  If told by your doctor, drink an ORS: ? Make an ORS by using instructions on the package. ? Start by drinking small amounts, about  cup (120 mL) every 5-10 minutes. ? Slowly drink more until you have had the amount that your doctor said to have.  Drink enough clear fluid to keep your pee clear or pale yellow. If you were told to drink an ORS, finish the ORS  first, then start slowly drinking clear fluids. Drink fluids such as: ? Water. Do not drink only water by itself. Doing that can make the salt (sodium) level in your body get too low (hyponatremia). ? Ice chips. ? Fruit juice that you have added water to (diluted). ? Low-calorie sports drinks.  Avoid: ? Alcohol. ? Drinks that have a lot of sugar. These include high-calorie sports drinks, fruit juice that does not have water added, and soda. ? Caffeine. ? Foods that are greasy or have a lot of fat or sugar.  Take over-the-counter and prescription medicines only as told by your doctor.  Do not take salt tablets. Doing that can make the salt level in your body get too high (hypernatremia).  Eat foods that have minerals (electrolytes). Examples include bananas, oranges, potatoes, tomatoes, and spinach.  Keep all follow-up visits as told by your doctor. This is important. Contact a doctor if:  You have belly (abdominal) pain that: ? Gets worse. ? Stays in one area (localizes).  You have a rash.  You have a stiff neck.  You get angry or annoyed more easily than normal (irritability).  You are more sleepy than normal.  You have a harder time waking up than normal.  You feel: ? Weak. ? Dizzy. ? Very thirsty.  You have peed (urinated) only a small amount of very dark pee during 6-8 hours. Get help right away if:  You have symptoms of   very bad dehydration.  You cannot drink fluids without throwing up (vomiting).  Your symptoms get worse with treatment.  You have a fever.  You have a very bad headache.  You are throwing up or having watery poop (diarrhea) and it: ? Gets worse. ? Does not go away.  You have blood or something green (bile) in your throw-up.  You have blood in your poop (stool). This may cause poop to look black and tarry.  You have not peed in 6-8 hours.  You pass out (faint).  Your heart rate when you are sitting still is more than 100 beats a  minute.  You have trouble breathing. This information is not intended to replace advice given to you by your health care provider. Make sure you discuss any questions you have with your health care provider. Document Released: 06/03/2009 Document Revised: 02/25/2016 Document Reviewed: 10/01/2015 Elsevier Interactive Patient Education  2018 [employer]  

## 2017-08-08 NOTE — Telephone Encounter (Signed)
Scheduled appt per 12/19 los - Gave patient AVS and calender per los.  

## 2017-08-09 ENCOUNTER — Encounter: Payer: Self-pay | Admitting: Hematology

## 2017-08-10 ENCOUNTER — Other Ambulatory Visit (HOSPITAL_BASED_OUTPATIENT_CLINIC_OR_DEPARTMENT_OTHER): Payer: Medicare Other

## 2017-08-10 DIAGNOSIS — C259 Malignant neoplasm of pancreas, unspecified: Secondary | ICD-10-CM | POA: Diagnosis present

## 2017-08-10 DIAGNOSIS — C787 Secondary malignant neoplasm of liver and intrahepatic bile duct: Principal | ICD-10-CM

## 2017-08-10 LAB — CBC WITH DIFFERENTIAL/PLATELET
BASO%: 0.3 % (ref 0.0–2.0)
BASOS ABS: 0 10*3/uL (ref 0.0–0.1)
EOS ABS: 0.1 10*3/uL (ref 0.0–0.5)
EOS%: 2.8 % (ref 0.0–7.0)
HCT: 31.8 % — ABNORMAL LOW (ref 34.8–46.6)
HEMOGLOBIN: 10.5 g/dL — AB (ref 11.6–15.9)
LYMPH%: 46.9 % (ref 14.0–49.7)
MCH: 31.3 pg (ref 25.1–34.0)
MCHC: 33.2 g/dL (ref 31.5–36.0)
MCV: 94.1 fL (ref 79.5–101.0)
MONO#: 0.4 10*3/uL (ref 0.1–0.9)
MONO%: 11.8 % (ref 0.0–14.0)
NEUT#: 1.2 10*3/uL — ABNORMAL LOW (ref 1.5–6.5)
NEUT%: 38.2 % — AB (ref 38.4–76.8)
Platelets: 74 10*3/uL — ABNORMAL LOW (ref 145–400)
RBC: 3.37 10*6/uL — AB (ref 3.70–5.45)
RDW: 13 % (ref 11.2–14.5)
WBC: 3.2 10*3/uL — ABNORMAL LOW (ref 3.9–10.3)
lymph#: 1.5 10*3/uL (ref 0.9–3.3)

## 2017-08-10 LAB — COMPREHENSIVE METABOLIC PANEL
ALBUMIN: 3.3 g/dL — AB (ref 3.5–5.0)
ALK PHOS: 72 U/L (ref 40–150)
ALT: 30 U/L (ref 0–55)
AST: 29 U/L (ref 5–34)
Anion Gap: 8 mEq/L (ref 3–11)
BUN: 9.7 mg/dL (ref 7.0–26.0)
CALCIUM: 9.3 mg/dL (ref 8.4–10.4)
CO2: 24 mEq/L (ref 22–29)
Chloride: 104 mEq/L (ref 98–109)
Creatinine: 0.9 mg/dL (ref 0.6–1.1)
Glucose: 119 mg/dl (ref 70–140)
POTASSIUM: 4 meq/L (ref 3.5–5.1)
Sodium: 137 mEq/L (ref 136–145)
Total Bilirubin: 0.29 mg/dL (ref 0.20–1.20)
Total Protein: 6.9 g/dL (ref 6.4–8.3)

## 2017-08-24 ENCOUNTER — Ambulatory Visit: Payer: Medicare Other

## 2017-08-24 ENCOUNTER — Ambulatory Visit (HOSPITAL_BASED_OUTPATIENT_CLINIC_OR_DEPARTMENT_OTHER): Payer: Medicare Other

## 2017-08-24 ENCOUNTER — Telehealth: Payer: Self-pay | Admitting: *Deleted

## 2017-08-24 ENCOUNTER — Ambulatory Visit: Payer: Medicare Other | Admitting: Nutrition

## 2017-08-24 ENCOUNTER — Other Ambulatory Visit (HOSPITAL_BASED_OUTPATIENT_CLINIC_OR_DEPARTMENT_OTHER): Payer: Medicare Other

## 2017-08-24 ENCOUNTER — Ambulatory Visit (HOSPITAL_BASED_OUTPATIENT_CLINIC_OR_DEPARTMENT_OTHER): Payer: Medicare Other | Admitting: Nurse Practitioner

## 2017-08-24 ENCOUNTER — Encounter: Payer: Self-pay | Admitting: Nurse Practitioner

## 2017-08-24 ENCOUNTER — Telehealth: Payer: Self-pay | Admitting: Hematology

## 2017-08-24 VITALS — BP 105/55 | HR 83 | Temp 97.9°F | Resp 20 | Ht 64.0 in | Wt 86.3 lb

## 2017-08-24 DIAGNOSIS — C787 Secondary malignant neoplasm of liver and intrahepatic bile duct: Principal | ICD-10-CM

## 2017-08-24 DIAGNOSIS — C259 Malignant neoplasm of pancreas, unspecified: Secondary | ICD-10-CM | POA: Diagnosis not present

## 2017-08-24 DIAGNOSIS — D709 Neutropenia, unspecified: Secondary | ICD-10-CM

## 2017-08-24 DIAGNOSIS — F17200 Nicotine dependence, unspecified, uncomplicated: Secondary | ICD-10-CM | POA: Diagnosis not present

## 2017-08-24 DIAGNOSIS — R630 Anorexia: Secondary | ICD-10-CM | POA: Diagnosis not present

## 2017-08-24 DIAGNOSIS — Z5111 Encounter for antineoplastic chemotherapy: Secondary | ICD-10-CM

## 2017-08-24 DIAGNOSIS — D696 Thrombocytopenia, unspecified: Secondary | ICD-10-CM | POA: Diagnosis not present

## 2017-08-24 DIAGNOSIS — Z7189 Other specified counseling: Secondary | ICD-10-CM

## 2017-08-24 LAB — CBC WITH DIFFERENTIAL/PLATELET
BASO%: 1 % (ref 0.0–2.0)
Basophils Absolute: 0.1 10*3/uL (ref 0.0–0.1)
EOS%: 0.5 % (ref 0.0–7.0)
Eosinophils Absolute: 0.1 10*3/uL (ref 0.0–0.5)
HCT: 30.1 % — ABNORMAL LOW (ref 34.8–46.6)
HEMOGLOBIN: 10.2 g/dL — AB (ref 11.6–15.9)
LYMPH%: 19 % (ref 14.0–49.7)
MCH: 31 pg (ref 25.1–34.0)
MCHC: 33.9 g/dL (ref 31.5–36.0)
MCV: 91.5 fL (ref 79.5–101.0)
MONO#: 1 10*3/uL — AB (ref 0.1–0.9)
MONO%: 10.7 % (ref 0.0–14.0)
NEUT%: 68.8 % (ref 38.4–76.8)
NEUTROS ABS: 6.6 10*3/uL — AB (ref 1.5–6.5)
PLATELETS: 392 10*3/uL (ref 145–400)
RBC: 3.29 10*6/uL — AB (ref 3.70–5.45)
RDW: 13.7 % (ref 11.2–14.5)
WBC: 9.6 10*3/uL (ref 3.9–10.3)
lymph#: 1.8 10*3/uL (ref 0.9–3.3)

## 2017-08-24 LAB — COMPREHENSIVE METABOLIC PANEL
ALT: 18 U/L (ref 0–55)
ANION GAP: 6 meq/L (ref 3–11)
AST: 21 U/L (ref 5–34)
Albumin: 3.5 g/dL (ref 3.5–5.0)
Alkaline Phosphatase: 81 U/L (ref 40–150)
BILIRUBIN TOTAL: 0.22 mg/dL (ref 0.20–1.20)
BUN: 12.6 mg/dL (ref 7.0–26.0)
CALCIUM: 9.4 mg/dL (ref 8.4–10.4)
CO2: 23 meq/L (ref 22–29)
CREATININE: 0.8 mg/dL (ref 0.6–1.1)
Chloride: 107 mEq/L (ref 98–109)
Glucose: 136 mg/dl (ref 70–140)
Potassium: 3.6 mEq/L (ref 3.5–5.1)
Sodium: 136 mEq/L (ref 136–145)
Total Protein: 7.5 g/dL (ref 6.4–8.3)

## 2017-08-24 MED ORDER — SODIUM CHLORIDE 0.9 % IV SOLN
Freq: Once | INTRAVENOUS | Status: AC
  Administered 2017-08-24: 13:00:00 via INTRAVENOUS

## 2017-08-24 MED ORDER — SODIUM CHLORIDE 0.9% FLUSH
10.0000 mL | INTRAVENOUS | Status: DC | PRN
  Administered 2017-08-24: 10 mL
  Filled 2017-08-24: qty 10

## 2017-08-24 MED ORDER — PROCHLORPERAZINE MALEATE 10 MG PO TABS
ORAL_TABLET | ORAL | Status: AC
  Filled 2017-08-24: qty 1

## 2017-08-24 MED ORDER — EMPTY CONTAINERS FLEXIBLE MISC
125.0000 mg/m2 | Freq: Once | Status: AC
  Administered 2017-08-24: 175 mg via INTRAVENOUS
  Filled 2017-08-24: qty 35

## 2017-08-24 MED ORDER — SODIUM CHLORIDE 0.9% FLUSH
10.0000 mL | Freq: Once | INTRAVENOUS | Status: AC
  Administered 2017-08-24: 10 mL
  Filled 2017-08-24: qty 10

## 2017-08-24 MED ORDER — SODIUM CHLORIDE 0.9 % IV SOLN
800.0000 mg/m2 | Freq: Once | INTRAVENOUS | Status: AC
  Administered 2017-08-24: 1064 mg via INTRAVENOUS
  Filled 2017-08-24: qty 27.98

## 2017-08-24 MED ORDER — PROCHLORPERAZINE MALEATE 10 MG PO TABS
10.0000 mg | ORAL_TABLET | Freq: Once | ORAL | Status: AC
  Administered 2017-08-24: 10 mg via ORAL

## 2017-08-24 MED ORDER — HEPARIN SOD (PORK) LOCK FLUSH 100 UNIT/ML IV SOLN
500.0000 [IU] | Freq: Once | INTRAVENOUS | Status: AC | PRN
  Administered 2017-08-24: 500 [IU]
  Filled 2017-08-24: qty 5

## 2017-08-24 NOTE — Progress Notes (Signed)
Nutrition follow-up completed with patient during infusion for metastatic pancreas cancer. Weight declined significantly and was documented as 86.3 pounds January 4. Patient reports she just doesn't like to eat. She has agreed to take both Remeron and Marinol. Reports one diarrhea stool after breakfast and then she is fine. She takes her Creon appropriately. Patient still wishes to gain weight.  Nutrition diagnosis: Unintended weight loss continues.  Intervention: Patient was educated to increase boost +4 times a day between meals. Stressed importance of patient consuming both high-calorie and high-protein foods. Questions answered.  Teach back method used.  Monitoring, evaluation, goals: Patient will work to increase calories and protein to minimize further weight loss.  Next visit: February 1 during infusion.  **Disclaimer: This note was dictated with voice recognition software. Similar sounding words can inadvertently be transcribed and this note may contain transcription errors which may not have been corrected upon publication of note.**

## 2017-08-24 NOTE — Patient Instructions (Signed)
[facility] Discharge Instructions for Patients Receiving Chemotherapy  Today you received the following chemotherapy agents: Abraxane and Gemzar   To help prevent nausea and vomiting after your treatment, we encourage you to take your nausea medication as directed.    If you develop nausea and vomiting that is not controlled by your nausea medication, call the clinic.   BELOW ARE SYMPTOMS THAT SHOULD BE REPORTED IMMEDIATELY:  *FEVER GREATER THAN 100.5 F  *CHILLS WITH OR WITHOUT FEVER  NAUSEA AND VOMITING THAT IS NOT CONTROLLED WITH YOUR NAUSEA MEDICATION  *UNUSUAL SHORTNESS OF BREATH  *UNUSUAL BRUISING OR BLEEDING  TENDERNESS IN MOUTH AND THROAT WITH OR WITHOUT PRESENCE OF ULCERS  *URINARY PROBLEMS  *BOWEL PROBLEMS  UNUSUAL RASH Items with * indicate a potential emergency and should be followed up as soon as possible.  Feel free to call the clinic should you have any questions or concerns. The clinic phone number is [phone].  Please show the CHEMO ALERT CARD at check-in to the Emergency Department and triage nurse.   

## 2017-08-24 NOTE — Progress Notes (Signed)
  Oncology Nurse Navigator Documentation  Navigator Location: [facility] (08/24/17 1325)   )Navigator Encounter Type: Treatment (08/24/17 1325)                             Interventions: Psycho-social support (08/24/17 1325)  Met with patient in infusion room. Patient tearful during interaction. Patient discouraged because of her weight loss and the need to adjust treatments. Patient states that she draws support from God and is leaning on Him. I encouraged patient to call me for additional support if needed.          Acuity: Level 2 (08/24/17 1325)         Time Spent with Patient: 30 (08/24/17 1325)

## 2017-08-24 NOTE — Telephone Encounter (Signed)
Gave avs and calendar for January and February  °

## 2017-08-24 NOTE — Telephone Encounter (Signed)
Pt's brother, Dr Verlene Mayer called to check on pt & states that Dr Burr Medico can call him anytime with concerns or questions.   She does have a signed release of info to give info to him.

## 2017-08-24 NOTE — Progress Notes (Signed)
[place]  [phone] [phone]  Clinic Follow up Note   Patient Care Team: Wilford Corner, MD as PCP - General (Gastroenterology) Truitt Merle, MD as Consulting Physician (Hematology) 08/24/2017  CHIEF COMPLAINT: Follow up pancreatic cancer with liver metastasis   SUMMARY OF ONCOLOGIC HISTORY:   Pancreatic cancer metastasized to liver ([place])   06/21/2017 Imaging    CT CAP IMPRESSION: 1. Indistinct low-attenuation mass in the uncinate process of the pancreas with dilatation of remainder of the pancreatic duct and some encasement of the SMA. These findings are highly indicative of pancreatic carcinoma. Recommend MRI to assess further. 2. Multiple low-attenuation lesions throughout the right and left lobes of liver most consistent with liver metastasis again MRI may be helpful. 3. Moderate change of abdominal aortic atherosclerosis. 4. Changes of centrilobular and paraseptal emphysema on CT of the chest. No lung lesion is seen.       07/06/2017 Initial Biopsy    Diagnosis Liver, needle/core biopsy, Right lobe - METASTATIC ADENOCARCINOMA.      07/10/2017 Initial Diagnosis    Pancreatic cancer metastasized to liver ([place])      07/25/2017 -  Chemotherapy    Gemcitabine and Abraxane 3 weeks on and 1 week off starting 07/25/17       CURRENT THERAPY: first line Gemcitabine and Abraxane 2 weeks on and 1 week off started 07/25/17. Gemcitabine dose reduced with cycle 1 day 8 due to thrombocytopenia and neutropenia  INTERVAL HISTORY: Ms. Crance returns for follow up as scheduled prior to beginning cycle 2 gem/abraxane chemotherapy. She tolerated cycle 1 day 8 on 08/01/17 well with reduced dose gemcitabine. She has decreased appetite and does not like food. This was present prior to diagnosis. She has not filled marinol prescription and stopped taking remeron after the bottle ran out, did not refill. Drinks 3 boost per day and eats peanut butter  crackers. Has 1 soft/loose BM per day and stops after imodium. Had regular formed stool today. She continues pancreatic enzymes. Denies nausea or vomiting. Notes mild fatigue but continues activities as normal. She went to [facility] for the holidays without difficulty. She is still trying to quit smoking.  REVIEW OF SYSTEMS:   Constitutional: Denies fevers, chills (+) abnormal weight loss Eyes: Denies blurriness of vision Ears, nose, mouth, throat, and face: Denies mucositis or sore throat Respiratory: Denies cough, dyspnea or wheezes Cardiovascular: Denies palpitation, chest discomfort or lower extremity swelling Gastrointestinal:  Denies nausea, vomiting, constipation, diarrhea, heartburn or change in bowel habits (+) soft/loose BM per day, imodium helps Skin: Denies abnormal skin rashes Lymphatics: Denies new lymphadenopathy or easy bruising Neurological:Denies numbness, tingling or new weaknesses Behavioral/Psych: Mood is stable, no new changes  All other systems were reviewed with the patient and are negative.  MEDICAL HISTORY:  Past Medical History:  Diagnosis Date  . Anxiety     SURGICAL HISTORY: Past Surgical History:  Procedure Laterality Date  . IR FLUORO GUIDE PORT INSERTION RIGHT  07/24/2017  . IR US GUIDE VASC ACCESS RIGHT  07/24/2017    I have reviewed the social history and family history with the patient and they are unchanged from previous note.  ALLERGIES:  has No Known Allergies.  MEDICATIONS:  Current Outpatient Medications  Medication Sig Dispense Refill  . lidocaine-prilocaine (EMLA) cream Apply to affected area once 30 g 3  . LORazepam (ATIVAN) 1 MG tablet Take 1 mg 2 (two) times daily by mouth.    . Multiple Vitamin (MULTIVITAMIN WITH MINERALS) TABS tablet  Take 1 tablet daily by mouth. One-A-Day for Women    . Pancrelipase, Lip-Prot-Amyl, (CREON) 24000-76000 units CPEP Take 1 capsule (24,000 Units total) 3 (three) times daily before meals by mouth. 180 capsule 2    . dronabinol (MARINOL) 2.5 MG capsule Take 1 capsule (2.5 mg total) by mouth 2 (two) times daily before a meal. 60 capsule 1  . mirtazapine (REMERON) 15 MG tablet Take 1 tablet (15 mg total) by mouth at bedtime. 30 tablet 3  . ondansetron (ZOFRAN) 8 MG tablet Take 1 tablet (8 mg total) by mouth 2 (two) times daily as needed (Nausea or vomiting). (Patient not taking: Reported on 08/24/2017) 30 tablet 1  . prochlorperazine (COMPAZINE) 10 MG tablet Take 1 tablet (10 mg total) by mouth every 6 (six) hours as needed (Nausea or vomiting). (Patient not taking: Reported on 08/24/2017) 30 tablet 1   No current facility-administered medications for this visit.    Facility-Administered Medications Ordered in Other Visits  Medication Dose Route Frequency Provider Last Rate Last Dose  . sodium chloride flush (NS) 0.9 % injection 10 mL  10 mL Intracatheter PRN Truitt Merle, MD   10 mL at 07/25/17 1708  . sodium chloride flush (NS) 0.9 % injection 10 mL  10 mL Intracatheter PRN Truitt Merle, MD   10 mL at 08/24/17 1545    PHYSICAL EXAMINATION: ECOG PERFORMANCE STATUS: 1 - Symptomatic but completely ambulatory  Vitals:   08/24/17 1219  BP: (!) 105/55  Pulse: 83  Resp: 20  Temp: 97.9 F (36.6 C)  SpO2: 100%   Filed Weights   08/24/17 1219  Weight: 86 lb 4.8 oz (39.1 kg)    GENERAL:alert, no distress and comfortable SKIN: skin color, texture, turgor are normal, no rashes or significant lesions EYES: normal, Conjunctiva are pink and non-injected, sclera clear OROPHARYNX:no exudate, no erythema and lips, buccal mucosa, and tongue normal  NECK: supple, thyroid normal size, non-tender, without nodularity LYMPH:  no palpable cervical, supraclavicular, or axillary lymphadenopathy LUNGS: clear to auscultation bilaterally with normal breathing effort HEART: regular rate & rhythm and no murmurs and no lower extremity edema ABDOMEN:abdomen soft, non-tender and normal bowel sounds Musculoskeletal:no cyanosis of  digits and no clubbing  NEURO: alert & oriented x 3 with fluent speech, no focal motor/sensory deficits PAC without erythema   LABORATORY DATA:  I have reviewed the data as listed CBC Latest Ref Rng & Units 08/24/2017 08/10/2017 08/08/2017  WBC 3.9 - 10.3 10e3/uL 9.6 3.2(L) 2.4(L)  Hemoglobin 11.6 - 15.9 g/dL 10.2(L) 10.5(L) 9.8(L)  Hematocrit 34.8 - 46.6 % 30.1(L) 31.8(L) 28.5(L)  Platelets 145 - 400 10e3/uL 392 74(L) 32(L)     CMP Latest Ref Rng & Units 08/24/2017 08/10/2017 08/08/2017  Glucose 70 - 140 mg/dl 136 119 126  BUN 7.0 - 26.0 mg/dL 12.6 9.7 12.8  Creatinine 0.6 - 1.1 mg/dL 0.8 0.9 0.8  Sodium 136 - 145 mEq/L 136 137 135(L)  Potassium 3.5 - 5.1 mEq/L 3.6 4.0 3.5  Chloride 101 - 111 mmol/L - - -  CO2 22 - 29 mEq/L 23 24 25   Calcium 8.4 - 10.4 mg/dL 9.4 9.3 9.3  Total Protein 6.4 - 8.3 g/dL 7.5 6.9 7.0  Total Bilirubin 0.20 - 1.20 mg/dL 0.22 0.29 0.38  Alkaline Phos 40 - 150 U/L 81 72 73  AST 5 - 34 U/L 21 29 32  ALT 0 - 55 U/L 18 30 38   CA19.9: 1   PATHOLOGY: #1 biopsy from duodenum, distal stomach, GE  junction no significant pathological changes, except reactive changes in stomach #2 transverse colon, hepatic flexure, sigmoid, polyps biopsy showed tubular adenomas (3)  Liver Biopsy  Diagnosis 07/06/17 Liver, needle/core biopsy, Right lobe - METASTATIC ADENOCARCINOMA. Microscopic Comment The tumor is positive for cytokeratin 7 and negative for cytokeratin 20, CDX-2 and TTF-1. The immunoprofile is non-specific, but the differential includes a upper GI or pancreaticobiliary primary among others. Dr. Lyndon Code has reviewed the case. Dr. Michail Sermon was paged on 07/10/2017.   PROCEDURES   EGD: 05/28/2017: Impression:  -Normal esophagus- -Z line irregular, 40 cm from the incisors, biopsied. -Acute gastritis, biopsied, -Normal examined duodenum, biopsied.  Colonoscopy: March 28, 2017 Impression -Multiple polyps, one 7 mm at the hepatic flexure, one 3 mm at  flexure, one 5 mm in the transverse colon, one 4 mm in the sigmoid colon, one 14 mm in the sigmoid colon, all removed -Normal mucosa in the entire examined colon -Diverticulosis in the sigmoid colon -Internal hemorrhoids    RADIOGRAPHIC STUDIES: I have personally reviewed the radiological images as listed and agreed with the findings in the report. No results found.   ASSESSMENT & PLAN: 67 year old African-American female, with past medical history of anxiety, otherwise healthy, presented with chronic diarrhea for 4 months, poor appetite and 35lbs weight loss.  CT scan showed a pancreatic head mass and multiple liver metastasis.  1. 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 Diarrhea 3. Anorexia and weight loss 4. Smoking cessation  Ms. Goldin appears stable today. She completed cycle 1 gem/abraxane with reduced dose gemcitabine due to thrombocytopenia and neutropenia, she was switched to 2 weeks on and 1 week off. She tolerated well on this regimen other than mild weight loss. She did not fill marinol and rant out of remeron which she has not refilled; she will fill both of these. I encouraged her to eat whatever she wants to gain weight, she agrees to try. Will see dietician Dory Peru today in infusion. Labs reviewed, ANC and platelets have recovered, Hgb stable with mild anemia 10.2.  Cmet unremarkable. She will proceed with cycle 2 day 1 gem/abraxane at current reduced dose gemzar. She will return in 1 week for cycle 2 day 8 and again in 3 weeks for f/u with Dr. Burr Medico prior to cycle 3.   PLAN -Fill marinol Rx, refill remeron and restart daily qHS; increase po intake to gain weight  -Labs reviewed, proceed with cycle 2 day 1 gem/abraxane with current reduced dose gemzar 800 mg/m2 -F/u dietician today in infusion  All questions were answered. The patient knows to call the clinic with any problems, questions or concerns. No barriers to learning was detected.     Alla Feeling,  NP 08/24/17

## 2017-08-27 ENCOUNTER — Encounter: Payer: Self-pay | Admitting: Nurse Practitioner

## 2017-08-27 ENCOUNTER — Encounter: Payer: Self-pay | Admitting: Hematology

## 2017-08-27 NOTE — Progress Notes (Signed)
Received PA request for Dronabinol.  Submitted via Cover My Meds:  Tammy Adams (Key: [id number])   Your information has been submitted to [place] [employer] Part D. [employer] will review the request and will issue a decision, typically within 1-3 days from your submission. You can check the updated outcome later by reopening this request. If [employer] has not responded in 1-3 days or if you have any questions about your ePA request, please contact [place] [employer] Part D at [phone]. If you think there may be a problem with your PA request, use our live chat feature at the bottom right

## 2017-08-27 NOTE — Progress Notes (Signed)
Received PA approval via email for Dronabinol:  Edom Boutelle Key: [id number] - PA Case ID: [id number] - Rx #: [id number] Need help? Call us at [phone]  Outcome  Approvedtoday  Your request has been approved

## 2017-08-27 NOTE — Progress Notes (Signed)
Received PA approval from  Portal for Dronabinol.  PA approved 08/21/17- 08/27/18.  Called [facility] pharmacy(Kim) to advise of approval. She states it went through and it brought the price down. She confirmed they will notify patient when its ready.

## 2017-08-31 ENCOUNTER — Inpatient Hospital Stay: Payer: Medicare Other

## 2017-08-31 ENCOUNTER — Inpatient Hospital Stay: Payer: Medicare Other | Attending: Hematology

## 2017-08-31 VITALS — BP 97/75 | HR 74 | Temp 98.6°F | Resp 20

## 2017-08-31 DIAGNOSIS — F418 Other specified anxiety disorders: Secondary | ICD-10-CM | POA: Insufficient documentation

## 2017-08-31 DIAGNOSIS — R531 Weakness: Secondary | ICD-10-CM | POA: Insufficient documentation

## 2017-08-31 DIAGNOSIS — F1290 Cannabis use, unspecified, uncomplicated: Secondary | ICD-10-CM | POA: Insufficient documentation

## 2017-08-31 DIAGNOSIS — Z800 Family history of malignant neoplasm of digestive organs: Secondary | ICD-10-CM | POA: Diagnosis not present

## 2017-08-31 DIAGNOSIS — Z8042 Family history of malignant neoplasm of prostate: Secondary | ICD-10-CM | POA: Insufficient documentation

## 2017-08-31 DIAGNOSIS — R5383 Other fatigue: Secondary | ICD-10-CM | POA: Diagnosis not present

## 2017-08-31 DIAGNOSIS — C787 Secondary malignant neoplasm of liver and intrahepatic bile duct: Principal | ICD-10-CM

## 2017-08-31 DIAGNOSIS — C259 Malignant neoplasm of pancreas, unspecified: Secondary | ICD-10-CM

## 2017-08-31 DIAGNOSIS — R197 Diarrhea, unspecified: Secondary | ICD-10-CM | POA: Insufficient documentation

## 2017-08-31 DIAGNOSIS — F17210 Nicotine dependence, cigarettes, uncomplicated: Secondary | ICD-10-CM | POA: Diagnosis not present

## 2017-08-31 DIAGNOSIS — Z5111 Encounter for antineoplastic chemotherapy: Secondary | ICD-10-CM | POA: Insufficient documentation

## 2017-08-31 DIAGNOSIS — Z79899 Other long term (current) drug therapy: Secondary | ICD-10-CM | POA: Insufficient documentation

## 2017-08-31 DIAGNOSIS — Z86010 Personal history of colonic polyps: Secondary | ICD-10-CM | POA: Insufficient documentation

## 2017-08-31 DIAGNOSIS — Z7189 Other specified counseling: Secondary | ICD-10-CM

## 2017-08-31 DIAGNOSIS — R630 Anorexia: Secondary | ICD-10-CM | POA: Insufficient documentation

## 2017-08-31 LAB — CBC WITH DIFFERENTIAL/PLATELET
BASOS ABS: 0.1 10*3/uL (ref 0.0–0.1)
Basophils Relative: 2 %
Eosinophils Absolute: 0.1 10*3/uL (ref 0.0–0.5)
Eosinophils Relative: 1 %
HEMATOCRIT: 28.8 % — AB (ref 34.8–46.6)
Hemoglobin: 9.7 g/dL — ABNORMAL LOW (ref 11.6–15.9)
LYMPHS ABS: 2.2 10*3/uL (ref 0.9–3.3)
LYMPHS PCT: 48 %
MCH: 30.8 pg (ref 25.1–34.0)
MCHC: 33.7 g/dL (ref 31.5–36.0)
MCV: 91.4 fL (ref 79.5–101.0)
Monocytes Absolute: 0.5 10*3/uL (ref 0.1–0.9)
Monocytes Relative: 10 %
NEUTROS ABS: 1.8 10*3/uL (ref 1.5–6.5)
Neutrophils Relative %: 39 %
Platelets: 183 10*3/uL (ref 145–400)
RBC: 3.15 MIL/uL — AB (ref 3.70–5.45)
RDW: 13.7 % (ref 11.2–16.1)
WBC: 4.6 10*3/uL (ref 3.9–10.3)

## 2017-08-31 LAB — COMPREHENSIVE METABOLIC PANEL
ALBUMIN: 3.5 g/dL (ref 3.5–5.0)
ALT: 36 U/L (ref 0–55)
ANION GAP: 8 (ref 3–11)
AST: 35 U/L — AB (ref 5–34)
Alkaline Phosphatase: 78 U/L (ref 40–150)
BILIRUBIN TOTAL: 0.5 mg/dL (ref 0.2–1.2)
BUN: 9 mg/dL (ref 7–26)
CHLORIDE: 106 mmol/L (ref 98–109)
CO2: 24 mmol/L (ref 22–29)
Calcium: 9.1 mg/dL (ref 8.4–10.4)
Creatinine, Ser: 0.74 mg/dL (ref 0.60–1.10)
GFR calc Af Amer: >60 mL/min (ref >60)
GLUCOSE: 86 mg/dL (ref 70–140)
POTASSIUM: 3.4 mmol/L (ref 3.3–4.7)
Sodium: 138 mmol/L (ref 136–145)
TOTAL PROTEIN: 7.1 g/dL (ref 6.4–8.3)

## 2017-08-31 MED ORDER — SODIUM CHLORIDE 0.9 % IV SOLN
800.0000 mg/m2 | Freq: Once | INTRAVENOUS | Status: AC
  Administered 2017-08-31: 1064 mg via INTRAVENOUS
  Filled 2017-08-31: qty 27.98

## 2017-08-31 MED ORDER — SODIUM CHLORIDE 0.9 % IV SOLN
Freq: Once | INTRAVENOUS | Status: AC
  Administered 2017-08-31: 13:00:00 via INTRAVENOUS

## 2017-08-31 MED ORDER — SODIUM CHLORIDE 0.9% FLUSH
10.0000 mL | Freq: Once | INTRAVENOUS | Status: AC
  Administered 2017-08-31: 10 mL
  Filled 2017-08-31: qty 10

## 2017-08-31 MED ORDER — PROCHLORPERAZINE MALEATE 10 MG PO TABS
ORAL_TABLET | ORAL | Status: AC
  Filled 2017-08-31: qty 1

## 2017-08-31 MED ORDER — SODIUM CHLORIDE 0.9% FLUSH
10.0000 mL | INTRAVENOUS | Status: DC | PRN
  Administered 2017-08-31: 10 mL
  Filled 2017-08-31: qty 10

## 2017-08-31 MED ORDER — PROCHLORPERAZINE MALEATE 10 MG PO TABS
10.0000 mg | ORAL_TABLET | Freq: Once | ORAL | Status: AC
  Administered 2017-08-31: 10 mg via ORAL

## 2017-08-31 MED ORDER — HEPARIN SOD (PORK) LOCK FLUSH 100 UNIT/ML IV SOLN
500.0000 [IU] | Freq: Once | INTRAVENOUS | Status: AC | PRN
  Administered 2017-08-31: 500 [IU]
  Filled 2017-08-31: qty 5

## 2017-08-31 MED ORDER — PACLITAXEL PROTEIN-BOUND CHEMO INJECTION 100 MG
125.0000 mg/m2 | Freq: Once | INTRAVENOUS | Status: AC
  Administered 2017-08-31: 175 mg via INTRAVENOUS
  Filled 2017-08-31: qty 35

## 2017-08-31 NOTE — Patient Instructions (Signed)
[facility] Discharge Instructions for Patients Receiving Chemotherapy  Today you received the following chemotherapy agents Abraxene and Gemzar  To help prevent nausea and vomiting after your treatment, we encourage you to take your nausea medication as directed.   If you develop nausea and vomiting that is not controlled by your nausea medication, call the clinic.   BELOW ARE SYMPTOMS THAT SHOULD BE REPORTED IMMEDIATELY:  *FEVER GREATER THAN 100.5 F  *CHILLS WITH OR WITHOUT FEVER  NAUSEA AND VOMITING THAT IS NOT CONTROLLED WITH YOUR NAUSEA MEDICATION  *UNUSUAL SHORTNESS OF BREATH  *UNUSUAL BRUISING OR BLEEDING  TENDERNESS IN MOUTH AND THROAT WITH OR WITHOUT PRESENCE OF ULCERS  *URINARY PROBLEMS  *BOWEL PROBLEMS  UNUSUAL RASH Items with * indicate a potential emergency and should be followed up as soon as possible.  Feel free to call the clinic should you have any questions or concerns. The clinic phone number is [phone].  Please show the [place] at check-in to the Emergency Department and triage nurse.

## 2017-09-07 ENCOUNTER — Ambulatory Visit: Payer: Medicare Other | Admitting: Hematology

## 2017-09-07 ENCOUNTER — Other Ambulatory Visit: Payer: Medicare Other

## 2017-09-07 ENCOUNTER — Ambulatory Visit: Payer: Medicare Other

## 2017-09-12 DIAGNOSIS — Z23 Encounter for immunization: Secondary | ICD-10-CM | POA: Diagnosis not present

## 2017-09-12 DIAGNOSIS — C787 Secondary malignant neoplasm of liver and intrahepatic bile duct: Secondary | ICD-10-CM | POA: Diagnosis not present

## 2017-09-12 DIAGNOSIS — F418 Other specified anxiety disorders: Secondary | ICD-10-CM | POA: Diagnosis not present

## 2017-09-12 DIAGNOSIS — C259 Malignant neoplasm of pancreas, unspecified: Secondary | ICD-10-CM | POA: Diagnosis not present

## 2017-09-12 DIAGNOSIS — R634 Abnormal weight loss: Secondary | ICD-10-CM | POA: Diagnosis not present

## 2017-09-13 ENCOUNTER — Other Ambulatory Visit: Payer: Self-pay | Admitting: Hematology

## 2017-09-13 DIAGNOSIS — C787 Secondary malignant neoplasm of liver and intrahepatic bile duct: Principal | ICD-10-CM

## 2017-09-13 DIAGNOSIS — C259 Malignant neoplasm of pancreas, unspecified: Secondary | ICD-10-CM

## 2017-09-13 NOTE — Progress Notes (Signed)
[place]  [phone] [phone]) [phone]  Clinic Follow Up Note   Patient Care Team: Wilford Corner, MD as PCP - General (Gastroenterology) Truitt Merle, MD as Consulting Physician (Hematology)   Date of Service:  08/08/2017  CHIEF COMPLAINTS  Pancreatic with liver metastasis   HISTORY OF PRESENTING ILLNESS:  Tammy Adams 67 y.o. female is here because of her abnormal CT findings which is highly suspicious for metastatic pancreatic malignancy.  She was referred by her gastroenterologist Dr. Michail Sermon.  She presents to my clinic with her husband today.  She has been having diarrhea for 4 months, she has loose/watery BM after each meals, no pain, nausea, or bloating. She has had low appetite since then, she has been eating very little, she was also recommended to avoid dairy products by her primary care physician, per her husband, she has been taking boost 3 bottles a day lately, she has lost aobut 35 lbs, but has been stable lately since she is taking boosts.   She was initially referred by her primary care physician, and subsequently referred to GI Dr. Michail Sermon.  She underwent EGD and colonoscopy, which showed benign polyps and colon otherwise negative.,  CT chest, abdomen and pelvis was obtained on June 21, 2017, which showed a indistinct low-attenuation mass in the uncinate process of the pancreas with dilatation of pancreatic duct multiple, low-attenuation lesions throughout the right and left lobe of liver most consistent with liver metastasis.  She was referred to IR for liver biopsy this Friday.  She is otherwise healthy, retired from post office, still works part-time as a Oceanographer.  She lives with her husband, who is disabled.  She overall has mild fatigue, but functions very well at home.  No other complaints.   CURRENT THERAPY: first line Gemcitabine and Abraxane 2 weeks on and 1 week off started 07/25/17    INTERVAL HISTORY  Tammy  Adams is here for a follow up and chemotherapy treatment. She presents to the clinic today accompanied by her family member. She notes that she did well since the last chemotherapy treatment. She reports that her appetite has improved with Rx marinol and she is now smoking marijuana as well. She notes that she has increased energy and she is able to cook meals. 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   On review of systems, she reports improving appetite with Rx marinol. She denies nausea and any other symptoms.     MEDICAL HISTORY:  Past Medical History:  Diagnosis Date  . Anxiety     SURGICAL HISTORY: Past Surgical History:  Procedure Laterality Date  . IR FLUORO GUIDE PORT INSERTION RIGHT  07/24/2017  . IR US GUIDE VASC ACCESS RIGHT  07/24/2017    SOCIAL HISTORY: Social History   Socioeconomic History  . Marital status: Married    Spouse name: Not on file  . Number of children: Not on file  . Years of education: Not on file  . Highest education level: Not on file  Social Needs  . Financial resource strain: Not on file  . Food insecurity - worry: Not on file  . Food insecurity - inability: Not on file  . Transportation needs - medical: Not on file  . Transportation needs - non-medical: Not on file  Occupational History  . Not on file  Tobacco Use  . Smoking status: Current Every Day Smoker    Packs/day:  0.50    Years: 50.00    Pack years: 25.00  . Smokeless tobacco: Never Used  Substance and Sexual Activity  . Alcohol use: No    Frequency: Never  . Drug use: Yes    Types: Marijuana  . Sexual activity: Not on file  Other Topics Concern  . Not on file  Social History Narrative  . Not on file    FAMILY HISTORY: Family History  Problem Relation Age of Onset  . Cancer Father        prostate cancer   . Cancer Maternal Aunt 70       pancreatic cancer   . Cancer Maternal Grandmother         colon cancer     ALLERGIES:  has No Known Allergies.  MEDICATIONS:  Current Outpatient Medications  Medication Sig Dispense Refill  . dronabinol (MARINOL) 2.5 MG capsule Take 1 capsule (2.5 mg total) by mouth 2 (two) times daily before a meal. 60 capsule 1  . lidocaine-prilocaine (EMLA) cream Apply to affected area once 30 g 3  . LORazepam (ATIVAN) 1 MG tablet Take 1 mg 2 (two) times daily by mouth.    . mirtazapine (REMERON) 15 MG tablet Take 1 tablet (15 mg total) by mouth at bedtime. 30 tablet 3  . Multiple Vitamin (MULTIVITAMIN WITH MINERALS) TABS tablet Take 1 tablet daily by mouth. One-A-Day for Women    . ondansetron (ZOFRAN) 8 MG tablet Take 1 tablet (8 mg total) by mouth 2 (two) times daily as needed (Nausea or vomiting). (Patient not taking: Reported on 08/24/2017) 30 tablet 1  . Pancrelipase, Lip-Prot-Amyl, (CREON) 24000-76000 units CPEP Take 1 capsule (24,000 Units total) 3 (three) times daily before meals by mouth. 180 capsule 2  . prochlorperazine (COMPAZINE) 10 MG tablet Take 1 tablet (10 mg total) by mouth every 6 (six) hours as needed (Nausea or vomiting). (Patient not taking: Reported on 08/24/2017) 30 tablet 1   No current facility-administered medications for this visit.    Facility-Administered Medications Ordered in Other Visits  Medication Dose Route Frequency Provider Last Rate Last Dose  . 0.9 %  sodium chloride infusion   Intravenous Once Truitt Merle, MD      . gemcitabine (GEMZAR) 1,064 mg in sodium chloride 0.9 % 250 mL chemo infusion  800 mg/m2 (Treatment Plan Recorded) Intravenous Once Truitt Merle, MD      . heparin lock flush 100 unit/mL  500 Units Intracatheter Once PRN Truitt Merle, MD      . PACLitaxel-protein bound (ABRAXANE) chemo infusion 175 mg  125 mg/m2 (Treatment Plan Recorded) Intravenous Once Truitt Merle, MD      . sodium chloride flush (NS) 0.9 % injection 10 mL  10 mL Intracatheter PRN Truitt Merle, MD   10 mL at 07/25/17 1708  . sodium chloride flush (NS) 0.9 %  injection 10 mL  10 mL Intracatheter PRN Truitt Merle, MD        REVIEW OF SYSTEMS:  Constitutional: Denies fevers, chills or abnormal night sweats, (+) 35 pound weight loss in the past 4 months, weight stable lately (+) gained 1 lb since last visit at 87 lbs today Eyes: Denies blurriness of vision, double vision or watery eyes Ears, nose, mouth, throat, and face: Denies mucositis or sore throat Respiratory: Denies cough, dyspnea or wheezes Cardiovascular: Denies palpitation, chest discomfort or lower extremity swelling Gastrointestinal:  Denies nausea, heartburn, (+) diarrhea  Skin: Denies abnormal skin rashes Lymphatics: Denies new lymphadenopathy or easy bruising Neurological:Denies  numbness, tingling or new weaknesses Behavioral/Psych: Mood is stable, no new changes  All other systems were reviewed with the patient and are negative.  PHYSICAL EXAMINATION:  ECOG PERFORMANCE STATUS: 1 - Symptomatic but completely ambulatory  Vitals:   09/14/17 1019  BP: 111/74  Pulse: 72  Resp: 16  SpO2: 100%   Filed Weights   09/14/17 1019  Weight: 87 lb 1.6 oz (39.5 kg)   GENERAL:alert, no distress and comfortable SKIN: skin color, texture, turgor are normal, no rashes or significant lesions EYES: normal, conjunctiva are pink and non-injected, sclera clear OROPHARYNX:no exudate, no erythema and lips, buccal mucosa, and tongue normal  NECK: supple, thyroid normal size, non-tender, without nodularity LYMPH:  no palpable lymphadenopathy in the cervical, axillary or inguinal LUNGS: clear to auscultation and percussion with normal breathing effort HEART: regular rate & rhythm and no murmurs and no lower extremity edema ABDOMEN:abdomen soft, non-tender and normal bowel sounds Musculoskeletal:no cyanosis of digits and no clubbing  PSYCH: alert & oriented x 3 with fluent speech NEURO: no focal motor/sensory deficits  LABORATORY DATA:  I have reviewed the data as listed CBC Latest Ref Rng & Units  09/14/2017 08/31/2017 08/24/2017  WBC 3.9 - 10.3 K/uL 9.4 4.6 9.6  Hemoglobin 11.6 - 15.9 g/dL 9.6(L) 9.7(L) 10.2(L)  Hematocrit 34.8 - 46.6 % 28.8(L) 28.8(L) 30.1(L)  Platelets 145 - 400 K/uL 296 183 392   CMP Latest Ref Rng & Units 09/14/2017 08/31/2017 08/24/2017  Glucose 70 - 140 mg/dL 88 86 136  BUN 7 - 26 mg/dL 14 9 12.6  Creatinine 0.60 - 1.10 mg/dL 0.71 0.74 0.8  Sodium 136 - 145 mmol/L 138 138 136  Potassium 3.3 - 4.7 mmol/L 3.5 3.4 3.6  Chloride 98 - 109 mmol/L 111(H) 106 -  CO2 22 - 29 mmol/L 20(L) 24 23  Calcium 8.4 - 10.4 mg/dL 9.1 9.1 9.4  Total Protein 6.4 - 8.3 g/dL 7.3 7.1 7.5  Total Bilirubin 0.2 - 1.2 mg/dL 0.3 0.5 0.22  Alkaline Phos 40 - 150 U/L 68 78 81  AST 5 - 34 U/L 14 35(H) 21  ALT 0 - 55 U/L 13 36 18    CA19.9: 1   PATHOLOGY: #1 biopsy from duodenum, distal stomach, GE junction no significant pathological changes, except reactive changes in stomach #2 transverse colon, hepatic flexure, sigmoid, polyps biopsy showed tubular adenomas (3)  Liver Biopsy  Diagnosis 07/06/17 Liver, needle/core biopsy, Right lobe - METASTATIC ADENOCARCINOMA. Microscopic Comment The tumor is positive for cytokeratin 7 and negative for cytokeratin 20, CDX-2 and TTF-1. The immunoprofile is non-specific, but the differential includes a upper GI or pancreaticobiliary primary among others. Dr. Lyndon Code has reviewed the case. Dr. Michail Sermon was paged on 07/10/2017.   PROCEDURES   EGD: 05/28/2017: Impression:  -Normal esophagus- -Z line irregular, 40 cm from the incisors, biopsied. -Acute gastritis, biopsied, -Normal examined duodenum, biopsied.  Colonoscopy: March 28, 2017 Impression -Multiple polyps, one 7 mm at the hepatic flexure, one 3 mm at flexure, one 5 mm in the transverse colon, one 4 mm in the sigmoid colon, one 14 mm in the sigmoid colon, all removed -Normal mucosa in the entire examined colon -Diverticulosis in the sigmoid colon -Internal hemorrhoids   RADIOGRAPHIC  STUDIES: I have personally reviewed the radiological images as listed and agreed with the findings in the report. No results found.  CT AP with contrast, 06/21/17 IMPRESSION: 1. Indistinct low-attenuation mass in the uncinate process of the pancreas with dilatation of remainder of the pancreatic  duct and some encasement of the SMA. These findings are highly indicative of pancreatic carcinoma. Recommend MRI to assess further. 2. Multiple low-attenuation lesions throughout the right and left lobes of liver most consistent with liver metastasis again MRI may be helpful. 3. Moderate change of abdominal aortic atherosclerosis. 4. Changes of centrilobular and paraseptal emphysema on CT of the chest. No lung lesion is seen.  ASSESSMENT & PLAN: 67 year old African-American female, with past medical history of anxiety, otherwise healthy, presented with chronic diarrhea for 4 months, poor appetite and 35lbs weight loss.  CT scan showed a pancreatic head mass and multiple liver metastasis.  1. 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 she has diffuse liver metastases in both lobes. -Her liver biopsy confirmed metastatic adenocarcinoma, consistent with pancreatic primary -We previously discussed her malignancy is not curable at this stage, but treatable. -Her tumor has MSI stable, not a candidate for immunotherapy.  I will refer her to genetics to see if she has BRCA mutations.  She does have family history of pancreatic cancer. -She has started first-line chemotherapy with gemcitabine and Abraxane, she tolerated the first cycle week 1 and 2 very well, with minimal side effects. -Patient has been tolerating chemotherapy well, her appetite has improved  recently, no pain or other symptoms. -Advised that as this is palliative treatment, if the tumor stops responding to the current treatment, then we will change her therapy to prolong and improve her quality of life.  -Advised and recommended that the pt remains as active as possible during chemotherapy treatments.  -RBC at 3.10, Hgb at 9.6, HCT at 28.8, platelets at 296, as of today, 09/14/2017  -follow up with dietician next week.  -Plan to repeat CT AP scan prior to next cycle  Lab, flush and chemo gemcitabine and abraxane in 3 and 4 weeks  F/u in 3 weeks  CT CAP with contrast a few days before her f/u    2. Diarrhea  -Probable related to her pancreatic malignancy -continue creon -She has been advised to take imodium along with her creon. Dosing instruction were reviewed.  -resolved now  -Discussed with patient today that she could stop taking creon and to continue taking it if her symptoms resume.   3.  Anorexia and weight loss -She has been drinking boost 3 bottles a day, which has been stabilized lately -She has used marijuana in the past, still has some at home, she will try marijuana for her anorexia. -I previously advised her to ween of Ativan to once daily.  -She was prescribed Mirtazapine on 07/11/17 to help with her appetite.  -she is eating better, weight stable lately  -Her appetite has improved with Rx Marinol. She is also using marijuana at home to aid with increasing her appetite.  -Continue on Rx Marinol, will discuss increasing the dosage if needed.   4. Smoking Cessation  -She is interested in quitting smoking, we previously encouraged her. She does not want to try nicotine products. She is asking about Chantix, has apt with PCP next week, previously recommended she discuss this with PCP.      Plan  Patient is clinically doing well, lab reviewed, adequate for treatment, will start cycle 3 gemcitabine and Abraxane today  Chemo and nutritional follow-up next lab,  flush and chemo gemcitabine and abraxane in 3 and 4 weeks  F/u in 3 weeks  CT CAP with contrast a few days before her f/u  Patient's brother called me yesterday and I updated him about her condition, with patient's permission.   All questions were answered. The patient knows to call the clinic with any problems, questions or concerns.  I spent 20 minutes counseling the patient face to face. The total time spent in the appointment was 25 minutes and more than 50% was on counseling.  This document serves as a record of services personally performed by Truitt Merle, MD. It was created on her behalf by Steva Colder, a trained medical scribe. The creation of this record is based on the scribe's personal observations and the provider's statements to them.   I have reviewed the above documentation for accuracy and completeness, and I agree with the above.      Truitt Merle, MD 09/14/17

## 2017-09-14 ENCOUNTER — Encounter: Payer: Self-pay | Admitting: Hematology

## 2017-09-14 ENCOUNTER — Inpatient Hospital Stay: Payer: Medicare Other

## 2017-09-14 ENCOUNTER — Inpatient Hospital Stay (HOSPITAL_BASED_OUTPATIENT_CLINIC_OR_DEPARTMENT_OTHER): Payer: Medicare Other | Admitting: Hematology

## 2017-09-14 VITALS — BP 111/74 | HR 72 | Resp 16 | Ht 64.0 in | Wt 87.1 lb

## 2017-09-14 DIAGNOSIS — R197 Diarrhea, unspecified: Secondary | ICD-10-CM

## 2017-09-14 DIAGNOSIS — C787 Secondary malignant neoplasm of liver and intrahepatic bile duct: Principal | ICD-10-CM

## 2017-09-14 DIAGNOSIS — F1290 Cannabis use, unspecified, uncomplicated: Secondary | ICD-10-CM

## 2017-09-14 DIAGNOSIS — Z5111 Encounter for antineoplastic chemotherapy: Secondary | ICD-10-CM | POA: Diagnosis not present

## 2017-09-14 DIAGNOSIS — C259 Malignant neoplasm of pancreas, unspecified: Secondary | ICD-10-CM

## 2017-09-14 DIAGNOSIS — Z79899 Other long term (current) drug therapy: Secondary | ICD-10-CM

## 2017-09-14 DIAGNOSIS — Z800 Family history of malignant neoplasm of digestive organs: Secondary | ICD-10-CM

## 2017-09-14 DIAGNOSIS — Z7189 Other specified counseling: Secondary | ICD-10-CM

## 2017-09-14 DIAGNOSIS — R630 Anorexia: Secondary | ICD-10-CM

## 2017-09-14 DIAGNOSIS — R531 Weakness: Secondary | ICD-10-CM

## 2017-09-14 DIAGNOSIS — F418 Other specified anxiety disorders: Secondary | ICD-10-CM

## 2017-09-14 DIAGNOSIS — Z86010 Personal history of colonic polyps: Secondary | ICD-10-CM

## 2017-09-14 DIAGNOSIS — R5383 Other fatigue: Secondary | ICD-10-CM

## 2017-09-14 DIAGNOSIS — F17210 Nicotine dependence, cigarettes, uncomplicated: Secondary | ICD-10-CM

## 2017-09-14 LAB — CBC WITH DIFFERENTIAL/PLATELET
BASOS ABS: 0 10*3/uL (ref 0.0–0.1)
BASOS PCT: 0 %
Eosinophils Absolute: 0.2 10*3/uL (ref 0.0–0.5)
Eosinophils Relative: 2 %
HCT: 28.8 % — ABNORMAL LOW (ref 34.8–46.6)
HEMOGLOBIN: 9.6 g/dL — AB (ref 11.6–15.9)
LYMPHS PCT: 16 %
Lymphs Abs: 1.5 10*3/uL (ref 0.9–3.3)
MCH: 31 pg (ref 25.1–34.0)
MCHC: 33.3 g/dL (ref 31.5–36.0)
MCV: 92.9 fL (ref 79.5–101.0)
MONOS PCT: 10 %
Monocytes Absolute: 0.9 10*3/uL (ref 0.1–0.9)
NEUTROS ABS: 6.8 10*3/uL — AB (ref 1.5–6.5)
NEUTROS PCT: 72 %
Platelets: 296 10*3/uL (ref 145–400)
RBC: 3.1 MIL/uL — ABNORMAL LOW (ref 3.70–5.45)
RDW: 15.9 % (ref 11.2–16.1)
WBC: 9.4 10*3/uL (ref 3.9–10.3)

## 2017-09-14 LAB — COMPREHENSIVE METABOLIC PANEL
ALBUMIN: 3.5 g/dL (ref 3.5–5.0)
ALK PHOS: 68 U/L (ref 40–150)
ALT: 13 U/L (ref 0–55)
ANION GAP: 7 (ref 3–11)
AST: 14 U/L (ref 5–34)
BUN: 14 mg/dL (ref 7–26)
CALCIUM: 9.1 mg/dL (ref 8.4–10.4)
CO2: 20 mmol/L — AB (ref 22–29)
Chloride: 111 mmol/L — ABNORMAL HIGH (ref 98–109)
Creatinine, Ser: 0.71 mg/dL (ref 0.60–1.10)
GFR calc non Af Amer: >60 mL/min (ref >60)
Glucose, Bld: 88 mg/dL (ref 70–140)
POTASSIUM: 3.5 mmol/L (ref 3.3–4.7)
SODIUM: 138 mmol/L (ref 136–145)
Total Bilirubin: 0.3 mg/dL (ref 0.2–1.2)
Total Protein: 7.3 g/dL (ref 6.4–8.3)

## 2017-09-14 MED ORDER — PROCHLORPERAZINE MALEATE 10 MG PO TABS
10.0000 mg | ORAL_TABLET | Freq: Once | ORAL | Status: AC
  Administered 2017-09-14: 10 mg via ORAL

## 2017-09-14 MED ORDER — HEPARIN SOD (PORK) LOCK FLUSH 100 UNIT/ML IV SOLN
500.0000 [IU] | Freq: Once | INTRAVENOUS | Status: AC | PRN
  Administered 2017-09-14: 500 [IU]
  Filled 2017-09-14: qty 5

## 2017-09-14 MED ORDER — SODIUM CHLORIDE 0.9 % IV SOLN
800.0000 mg/m2 | Freq: Once | INTRAVENOUS | Status: AC
  Administered 2017-09-14: 1064 mg via INTRAVENOUS
  Filled 2017-09-14: qty 27.98

## 2017-09-14 MED ORDER — SODIUM CHLORIDE 0.9 % IV SOLN
Freq: Once | INTRAVENOUS | Status: AC
  Administered 2017-09-14: 12:00:00 via INTRAVENOUS

## 2017-09-14 MED ORDER — SODIUM CHLORIDE 0.9% FLUSH
10.0000 mL | Freq: Once | INTRAVENOUS | Status: AC
  Administered 2017-09-14: 10 mL
  Filled 2017-09-14: qty 10

## 2017-09-14 MED ORDER — PACLITAXEL PROTEIN-BOUND CHEMO INJECTION 100 MG
125.0000 mg/m2 | Freq: Once | Status: AC
  Administered 2017-09-14: 175 mg via INTRAVENOUS
  Filled 2017-09-14: qty 35

## 2017-09-14 MED ORDER — PROCHLORPERAZINE MALEATE 10 MG PO TABS
ORAL_TABLET | ORAL | Status: AC
  Filled 2017-09-14: qty 1

## 2017-09-14 MED ORDER — SODIUM CHLORIDE 0.9% FLUSH
10.0000 mL | INTRAVENOUS | Status: DC | PRN
  Administered 2017-09-14: 10 mL
  Filled 2017-09-14: qty 10

## 2017-09-14 NOTE — Patient Instructions (Signed)
[place] [facility] Discharge Instructions for Patients Receiving Chemotherapy  Today you received the following chemotherapy agents Abraxene and Gemzar  To help prevent nausea and vomiting after your treatment, we encourage you to take your nausea medication as directed.   If you develop nausea and vomiting that is not controlled by your nausea medication, call the clinic.   BELOW ARE SYMPTOMS THAT SHOULD BE REPORTED IMMEDIATELY:  *FEVER GREATER THAN 100.5 F  *CHILLS WITH OR WITHOUT FEVER  NAUSEA AND VOMITING THAT IS NOT CONTROLLED WITH YOUR NAUSEA MEDICATION  *UNUSUAL SHORTNESS OF BREATH  *UNUSUAL BRUISING OR BLEEDING  TENDERNESS IN MOUTH AND THROAT WITH OR WITHOUT PRESENCE OF ULCERS  *URINARY PROBLEMS  *BOWEL PROBLEMS  UNUSUAL RASH Items with * indicate a potential emergency and should be followed up as soon as possible.  Feel free to call the clinic should you have any questions or concerns. The clinic phone number is [phone].  Please show the [facility] at check-in to the Emergency Department and triage nurse.

## 2017-09-14 NOTE — Progress Notes (Signed)
Dr. Burr Medico aware that patient has family coming to the 2/15 MD visit and Dr. Burr Medico will change the appt from [facility] to Dr Burr Medico herself to discuss treatment.

## 2017-09-15 LAB — CANCER ANTIGEN 19-9: CAN 19-9: 2 U/mL (ref 0–35)

## 2017-09-21 ENCOUNTER — Inpatient Hospital Stay: Payer: Medicare Other

## 2017-09-21 ENCOUNTER — Encounter: Payer: Medicare Other | Admitting: Nutrition

## 2017-09-21 ENCOUNTER — Inpatient Hospital Stay: Payer: Medicare Other | Attending: Hematology

## 2017-09-21 ENCOUNTER — Inpatient Hospital Stay: Payer: Medicare Other | Admitting: Nutrition

## 2017-09-21 VITALS — BP 121/55 | HR 99 | Temp 97.9°F | Resp 16

## 2017-09-21 DIAGNOSIS — C259 Malignant neoplasm of pancreas, unspecified: Secondary | ICD-10-CM

## 2017-09-21 DIAGNOSIS — R197 Diarrhea, unspecified: Secondary | ICD-10-CM | POA: Diagnosis not present

## 2017-09-21 DIAGNOSIS — E876 Hypokalemia: Secondary | ICD-10-CM | POA: Insufficient documentation

## 2017-09-21 DIAGNOSIS — Z800 Family history of malignant neoplasm of digestive organs: Secondary | ICD-10-CM | POA: Insufficient documentation

## 2017-09-21 DIAGNOSIS — F17210 Nicotine dependence, cigarettes, uncomplicated: Secondary | ICD-10-CM | POA: Diagnosis not present

## 2017-09-21 DIAGNOSIS — R634 Abnormal weight loss: Secondary | ICD-10-CM | POA: Insufficient documentation

## 2017-09-21 DIAGNOSIS — C787 Secondary malignant neoplasm of liver and intrahepatic bile duct: Secondary | ICD-10-CM | POA: Diagnosis not present

## 2017-09-21 DIAGNOSIS — R630 Anorexia: Secondary | ICD-10-CM | POA: Insufficient documentation

## 2017-09-21 DIAGNOSIS — R5383 Other fatigue: Secondary | ICD-10-CM | POA: Diagnosis not present

## 2017-09-21 DIAGNOSIS — Z5111 Encounter for antineoplastic chemotherapy: Secondary | ICD-10-CM | POA: Insufficient documentation

## 2017-09-21 DIAGNOSIS — Z7189 Other specified counseling: Secondary | ICD-10-CM

## 2017-09-21 DIAGNOSIS — Z79899 Other long term (current) drug therapy: Secondary | ICD-10-CM | POA: Diagnosis not present

## 2017-09-21 DIAGNOSIS — Z8042 Family history of malignant neoplasm of prostate: Secondary | ICD-10-CM | POA: Insufficient documentation

## 2017-09-21 DIAGNOSIS — F1290 Cannabis use, unspecified, uncomplicated: Secondary | ICD-10-CM | POA: Diagnosis not present

## 2017-09-21 LAB — COMPREHENSIVE METABOLIC PANEL
ALT: 15 U/L (ref 0–55)
AST: 17 U/L (ref 5–34)
Albumin: 3.5 g/dL (ref 3.5–5.0)
Alkaline Phosphatase: 78 U/L (ref 40–150)
Anion gap: 9 (ref 3–11)
BUN: 21 mg/dL (ref 7–26)
CHLORIDE: 108 mmol/L (ref 98–109)
CO2: 22 mmol/L (ref 22–29)
Calcium: 9.7 mg/dL (ref 8.4–10.4)
Creatinine, Ser: 0.83 mg/dL (ref 0.60–1.10)
GFR calc Af Amer: >60 mL/min (ref >60)
Glucose, Bld: 108 mg/dL (ref 70–140)
POTASSIUM: 3.6 mmol/L (ref 3.5–5.1)
Sodium: 139 mmol/L (ref 136–145)
Total Bilirubin: 0.5 mg/dL (ref 0.2–1.2)
Total Protein: 8 g/dL (ref 6.4–8.3)

## 2017-09-21 LAB — CBC WITH DIFFERENTIAL/PLATELET
Basophils Absolute: 0.1 10*3/uL (ref 0.0–0.1)
Basophils Relative: 1 %
Eosinophils Absolute: 0 10*3/uL (ref 0.0–0.5)
Eosinophils Relative: 0 %
HEMATOCRIT: 28 % — AB (ref 34.8–46.6)
HEMOGLOBIN: 9.3 g/dL — AB (ref 11.6–15.9)
LYMPHS PCT: 14 %
Lymphs Abs: 1.7 10*3/uL (ref 0.9–3.3)
MCH: 30.7 pg (ref 25.1–34.0)
MCHC: 33.2 g/dL (ref 31.5–36.0)
MCV: 92.4 fL (ref 79.5–101.0)
Monocytes Absolute: 0.7 10*3/uL (ref 0.1–0.9)
Monocytes Relative: 6 %
NEUTROS PCT: 79 %
Neutro Abs: 9.3 10*3/uL — ABNORMAL HIGH (ref 1.5–6.5)
Platelets: 280 10*3/uL (ref 145–400)
RBC: 3.03 MIL/uL — AB (ref 3.70–5.45)
RDW: 15.7 % — ABNORMAL HIGH (ref 11.2–14.5)
WBC: 11.8 10*3/uL — AB (ref 3.9–10.3)

## 2017-09-21 MED ORDER — SODIUM CHLORIDE 0.9% FLUSH
10.0000 mL | Freq: Once | INTRAVENOUS | Status: AC
  Administered 2017-09-21: 10 mL
  Filled 2017-09-21: qty 10

## 2017-09-21 MED ORDER — SODIUM CHLORIDE 0.9 % IV SOLN
Freq: Once | INTRAVENOUS | Status: AC
  Administered 2017-09-21: 13:00:00 via INTRAVENOUS

## 2017-09-21 MED ORDER — PROCHLORPERAZINE MALEATE 10 MG PO TABS
ORAL_TABLET | ORAL | Status: AC
  Filled 2017-09-21: qty 1

## 2017-09-21 MED ORDER — PROCHLORPERAZINE MALEATE 10 MG PO TABS
10.0000 mg | ORAL_TABLET | Freq: Once | ORAL | Status: AC
  Administered 2017-09-21: 10 mg via ORAL

## 2017-09-21 MED ORDER — HEPARIN SOD (PORK) LOCK FLUSH 100 UNIT/ML IV SOLN
500.0000 [IU] | Freq: Once | INTRAVENOUS | Status: AC | PRN
  Administered 2017-09-21: 500 [IU]
  Filled 2017-09-21: qty 5

## 2017-09-21 MED ORDER — PACLITAXEL PROTEIN-BOUND CHEMO INJECTION 100 MG
125.0000 mg/m2 | Freq: Once | Status: AC
  Administered 2017-09-21: 175 mg via INTRAVENOUS
  Filled 2017-09-21: qty 35

## 2017-09-21 MED ORDER — SODIUM CHLORIDE 0.9 % IV SOLN
800.0000 mg/m2 | Freq: Once | INTRAVENOUS | Status: AC
  Administered 2017-09-21: 1064 mg via INTRAVENOUS
  Filled 2017-09-21: qty 27.98

## 2017-09-21 MED ORDER — SODIUM CHLORIDE 0.9% FLUSH
10.0000 mL | INTRAVENOUS | Status: DC | PRN
  Administered 2017-09-21: 10 mL
  Filled 2017-09-21: qty 10

## 2017-09-21 NOTE — Progress Notes (Signed)
Nutrition follow-up completed with patient during infusion for metastatic pancreas cancer.  Patient's weight up slightly to 87.1 pounds January 25th from 86.3 pounds January 4. Patient reports she felt like she was doing better. On Wednesday she started to experience significant diarrhea. Reports she has not been taking Creon as originally prescribed.  Nutrition diagnosis: Unintended weight loss slightly improved.  Intervention: Encourage patient to continue boost plus between meals 4 times daily Educated patient on the importance of taking Creon with first bite of food Questions were answered and teach back method used.  Monitoring evaluation goals: Patient will continue to work to increase calories and protein to minimize weight loss  Next visit: To be scheduled as needed.  **Disclaimer: This note was dictated with voice recognition software. Similar sounding words can inadvertently be transcribed and this note may contain transcription errors which may not have been corrected upon publication of note.**

## 2017-09-21 NOTE — Patient Instructions (Signed)
[facility] Discharge Instructions for Patients Receiving Chemotherapy  Today you received the following chemotherapy agents: Abraxane and Gemzar   To help prevent nausea and vomiting after your treatment, we encourage you to take your nausea medication as directed.    If you develop nausea and vomiting that is not controlled by your nausea medication, call the clinic.   BELOW ARE SYMPTOMS THAT SHOULD BE REPORTED IMMEDIATELY:  *FEVER GREATER THAN 100.5 F  *CHILLS WITH OR WITHOUT FEVER  NAUSEA AND VOMITING THAT IS NOT CONTROLLED WITH YOUR NAUSEA MEDICATION  *UNUSUAL SHORTNESS OF BREATH  *UNUSUAL BRUISING OR BLEEDING  TENDERNESS IN MOUTH AND THROAT WITH OR WITHOUT PRESENCE OF ULCERS  *URINARY PROBLEMS  *BOWEL PROBLEMS  UNUSUAL RASH Items with * indicate a potential emergency and should be followed up as soon as possible.  Feel free to call the clinic should you have any questions or concerns. The clinic phone number is [phone].  Please show the CHEMO ALERT CARD at check-in to the Emergency Department and triage nurse.   

## 2017-10-02 ENCOUNTER — Ambulatory Visit (HOSPITAL_COMMUNITY)
Admission: RE | Admit: 2017-10-02 | Discharge: 2017-10-02 | Disposition: A | Discharge status: Home / Self Care | Payer: Medicare Other | Source: Ambulatory Visit | Attending: Hematology | Admitting: Hematology

## 2017-10-02 DIAGNOSIS — R64 Cachexia: Secondary | ICD-10-CM | POA: Insufficient documentation

## 2017-10-02 DIAGNOSIS — C259 Malignant neoplasm of pancreas, unspecified: Secondary | ICD-10-CM | POA: Diagnosis not present

## 2017-10-02 DIAGNOSIS — C787 Secondary malignant neoplasm of liver and intrahepatic bile duct: Secondary | ICD-10-CM | POA: Diagnosis not present

## 2017-10-02 DIAGNOSIS — R195 Other fecal abnormalities: Secondary | ICD-10-CM | POA: Insufficient documentation

## 2017-10-02 DIAGNOSIS — R180 Malignant ascites: Secondary | ICD-10-CM | POA: Insufficient documentation

## 2017-10-02 DIAGNOSIS — J439 Emphysema, unspecified: Secondary | ICD-10-CM | POA: Diagnosis not present

## 2017-10-02 DIAGNOSIS — I700 Atherosclerosis of aorta: Secondary | ICD-10-CM | POA: Insufficient documentation

## 2017-10-02 DIAGNOSIS — Z5111 Encounter for antineoplastic chemotherapy: Secondary | ICD-10-CM | POA: Diagnosis not present

## 2017-10-02 DIAGNOSIS — R609 Edema, unspecified: Secondary | ICD-10-CM | POA: Insufficient documentation

## 2017-10-02 MED ORDER — IOPAMIDOL (ISOVUE-300) INJECTION 61%
100.0000 mL | Freq: Once | INTRAVENOUS | Status: AC | PRN
  Administered 2017-10-02: 100 mL via INTRAVENOUS

## 2017-10-02 MED ORDER — IOPAMIDOL (ISOVUE-300) INJECTION 61%
INTRAVENOUS | Status: AC
  Filled 2017-10-02: qty 100

## 2017-10-04 NOTE — Progress Notes (Signed)
[facility]  [phone] [phone]  Clinic Follow Up Note   Patient Care Team: Schooler, Vincent, MD as PCP - General (Gastroenterology) Feng, Yan, MD as Consulting Physician (Hematology)   Date of Service:  10/05/2017   CHIEF COMPLAINTS  Pancreatic cancer with liver metastasis   HISTORY OF PRESENTING ILLNESS:  Tammy Adams 66 y.o. female is here because of her abnormal CT findings which is highly suspicious for metastatic pancreatic malignancy.  She was referred by her gastroenterologist Dr. Schooler.  She presents to my clinic with her husband today.  She has been having diarrhea for 4 months, she has loose/watery BM after each meals, no pain, nausea, or bloating. She has had low appetite since then, she has been eating very little, she was also recommended to avoid dairy products by her primary care physician, per her husband, she has been taking boost 3 bottles a day lately, she has lost aobut 35 lbs, but has been stable lately since she is taking boosts.   She was initially referred by her primary care physician, and subsequently referred to GI Dr. Schooler.  She underwent EGD and colonoscopy, which showed benign polyps and colon otherwise negative.,  CT chest, abdomen and pelvis was obtained on June 21, 2017, which showed a indistinct low-attenuation mass in the uncinate process of the pancreas with dilatation of pancreatic duct multiple, low-attenuation lesions throughout the right and left lobe of liver most consistent with liver metastasis.  She was referred to IR for liver biopsy this Friday.  She is otherwise healthy, retired from post office, still works part-time as a substitute teacher.  She lives with her husband, who is disabled.  She overall has mild fatigue, but functions very well at home.  No other complaints.   CURRENT THERAPY: first line Gemcitabine and Abraxane 2 weeks on and 1 week off started 07/25/17    INTERVAL HISTORY    Nasirah Marsalis is here for a follow up and chemotherapy treatment. She presents to the clinic today accompanied by her daughter. She notes that she did well since the last chemotherapy treatment. She reports that her appetite has improved with Rx marinol and she is now smoking marijuana as well. She does drink 3 Ensure Boost per day.   She reports she does not like taking creon and states that it causes her to have diarrhea. She endorses 3-4 times a day.   On review of systems, pt denies pain, nausea, or any other complaints at this time. Pertinent positives are listed and detailed within the above HPI.   MEDICAL HISTORY:  Past Medical History:  Diagnosis Date  . Anxiety     SURGICAL HISTORY: Past Surgical History:  Procedure Laterality Date  . IR FLUORO GUIDE PORT INSERTION RIGHT  07/24/2017  . IR US GUIDE VASC ACCESS RIGHT  07/24/2017    SOCIAL HISTORY: Social History   Socioeconomic History  . Marital status: Married    Spouse name: Not on file  . Number of children: Not on file  . Years of education: Not on file  . Highest education level: Not on file  Social Needs  . Financial resource strain: Not on file  . Food insecurity - worry: Not on file  . Food insecurity - inability: Not on file  . Transportation needs - medical: Not on file  . Transportation needs - non-medical: Not on file  Occupational History  . Not on file  Tobacco Use  . Smoking status: Current Every Day   Smoker    Packs/day: 0.50    Years: 50.00    Pack years: 25.00  . Smokeless tobacco: Never Used  Substance and Sexual Activity  . Alcohol use: No    Frequency: Never  . Drug use: Yes    Types: Marijuana  . Sexual activity: Not on file  Other Topics Concern  . Not on file  Social History Narrative  . Not on file    FAMILY HISTORY: Family History  Problem Relation Age of Onset  . Cancer Father        prostate cancer   . Cancer Maternal Aunt 70       pancreatic cancer   . Cancer Maternal  Grandmother        colon cancer     ALLERGIES:  has No Known Allergies.  MEDICATIONS:  Current Outpatient Medications  Medication Sig Dispense Refill  . dronabinol (MARINOL) 2.5 MG capsule Take 1 capsule (2.5 mg total) by mouth 2 (two) times daily before a meal. 60 capsule 1  . lidocaine-prilocaine (EMLA) cream Apply to affected area once 30 g 3  . LORazepam (ATIVAN) 1 MG tablet Take 1 mg 2 (two) times daily by mouth.    . mirtazapine (REMERON) 15 MG tablet Take 1 tablet (15 mg total) by mouth at bedtime. 30 tablet 3  . Multiple Vitamin (MULTIVITAMIN WITH MINERALS) TABS tablet Take 1 tablet daily by mouth. One-A-Day for Women    . Pancrelipase, Lip-Prot-Amyl, (CREON) 24000-76000 units CPEP Take 1 capsule (24,000 Units total) 3 (three) times daily before meals by mouth. 180 capsule 2  . diphenoxylate-atropine (LOMOTIL) 2.5-0.025 MG tablet Take 1 tablet by mouth 4 (four) times daily as needed for diarrhea or loose stools. 30 tablet 2  . ondansetron (ZOFRAN) 8 MG tablet Take 1 tablet (8 mg total) by mouth 2 (two) times daily as needed (Nausea or vomiting). (Patient not taking: Reported on 08/24/2017) 30 tablet 1  . potassium chloride SA (K-DUR,KLOR-CON) 20 MEQ tablet Take 1 tablet (20 mEq total) by mouth daily. 40 tablet 1  . prochlorperazine (COMPAZINE) 10 MG tablet Take 1 tablet (10 mg total) by mouth every 6 (six) hours as needed (Nausea or vomiting). (Patient not taking: Reported on 08/24/2017) 30 tablet 1   No current facility-administered medications for this visit.    Facility-Administered Medications Ordered in Other Visits  Medication Dose Route Frequency Provider Last Rate Last Dose  . sodium chloride flush (NS) 0.9 % injection 10 mL  10 mL Intracatheter PRN Feng, Yan, MD   10 mL at 07/25/17 1708    REVIEW OF SYSTEMS:  Constitutional: Denies fevers, chills or abnormal night sweats, (+) 35 pound weight loss in the past 4 months, weight stable lately (+) gained 1 lb since last visit at  87 lbs today Eyes: Denies blurriness of vision, double vision or watery eyes Ears, nose, mouth, throat, and face: Denies mucositis or sore throat Respiratory: Denies cough, dyspnea or wheezes Cardiovascular: Denies palpitation, chest discomfort or lower extremity swelling Gastrointestinal:  Denies nausea, heartburn, (+) diarrhea  Skin: Denies abnormal skin rashes Lymphatics: Denies new lymphadenopathy or easy bruising Neurological:Denies numbness, tingling or new weaknesses Behavioral/Psych: Mood is stable, no new changes  All other systems were reviewed with the patient and are negative.  PHYSICAL EXAMINATION:  ECOG PERFORMANCE STATUS: 1 - Symptomatic but completely ambulatory  Vitals:   10/05/17 1020  BP: 92/78  Pulse: 63  Resp: 17  Temp: (!) 97.5 F (36.4 C)  SpO2: 100%     Filed Weights   10/05/17 1020  Weight: 82 lb 3.2 oz (37.3 kg)   GENERAL:alert, no distress and comfortable SKIN: skin color, texture, turgor are normal, no rashes or significant lesions EYES: normal, conjunctiva are pink and non-injected, sclera clear OROPHARYNX:no exudate, no erythema and lips, buccal mucosa, and tongue normal  NECK: supple, thyroid normal size, non-tender, without nodularity LYMPH:  no palpable lymphadenopathy in the cervical, axillary or inguinal LUNGS: clear to auscultation and percussion with normal breathing effort HEART: regular rate & rhythm and no murmurs and no lower extremity edema ABDOMEN:abdomen soft, non-tender and normal bowel sounds Musculoskeletal:no cyanosis of digits and no clubbing  PSYCH: alert & oriented x 3 with fluent speech NEURO: no focal motor/sensory deficits  LABORATORY DATA:  I have reviewed the data as listed CBC Latest Ref Rng & Units 10/05/2017 09/21/2017 09/14/2017  WBC 3.9 - 10.3 K/uL 5.0 11.8(H) 9.4  Hemoglobin 11.6 - 15.9 g/dL 9.5(L) 9.3(L) 9.6(L)  Hematocrit 34.8 - 46.6 % 28.6(L) 28.0(L) 28.8(L)  Platelets 145 - 400 K/uL 319 280 296   CMP Latest  Ref Rng & Units 10/05/2017 09/21/2017 09/14/2017  Glucose 70 - 140 mg/dL 88 108 88  BUN 7 - 26 mg/dL 12 21 14  Creatinine 0.60 - 1.10 mg/dL 0.86 0.83 0.71  Sodium 136 - 145 mmol/L 140 139 138  Potassium 3.5 - 5.1 mmol/L 2.9(LL) 3.6 3.5  Chloride 98 - 109 mmol/L 110(H) 108 111(H)  CO2 22 - 29 mmol/L 21(L) 22 20(L)  Calcium 8.4 - 10.4 mg/dL 9.1 9.7 9.1  Total Protein 6.4 - 8.3 g/dL 7.1 8.0 7.3  Total Bilirubin 0.2 - 1.2 mg/dL 0.3 0.5 0.3  Alkaline Phos 40 - 150 U/L 59 78 68  AST 5 - 34 U/L 16 17 14  ALT 0 - 55 U/L 13 15 13    CA19.9: 1   PATHOLOGY: #1 biopsy from duodenum, distal stomach, GE junction no significant pathological changes, except reactive changes in stomach #2 transverse colon, hepatic flexure, sigmoid, polyps biopsy showed tubular adenomas (3)  Liver Biopsy  Diagnosis 07/06/17 Liver, needle/core biopsy, Right lobe - METASTATIC ADENOCARCINOMA. Microscopic Comment The tumor is positive for cytokeratin 7 and negative for cytokeratin 20, CDX-2 and TTF-1. The immunoprofile is non-specific, but the differential includes a upper GI or pancreaticobiliary primary among others. Dr. Kish has reviewed the case. Dr. Schooler was paged on 07/10/2017.   PROCEDURES   EGD: 05/28/2017: Impression:  -Normal esophagus- -Z line irregular, 40 cm from the incisors, biopsied. -Acute gastritis, biopsied, -Normal examined duodenum, biopsied.  Colonoscopy: March 28, 2017 Impression -Multiple polyps, one 7 mm at the hepatic flexure, one 3 mm at flexure, one 5 mm in the transverse colon, one 4 mm in the sigmoid colon, one 14 mm in the sigmoid colon, all removed -Normal mucosa in the entire examined colon -Diverticulosis in the sigmoid colon -Internal hemorrhoids   RADIOGRAPHIC STUDIES: I have personally reviewed the radiological images as listed and agreed with the findings in the report. Ct Chest W Contrast  Result Date: 10/02/2017 CLINICAL DATA:  Restaging of metastatic pancreatic  cancer originally diagnosed in November 2018. Chemotherapy in progress. EXAM: CT CHEST, ABDOMEN, AND PELVIS WITH CONTRAST TECHNIQUE: Multidetector CT imaging of the chest, abdomen and pelvis was performed following the standard protocol during bolus administration of intravenous contrast. CONTRAST:  100mL ISOVUE-300 IOPAMIDOL (ISOVUE-300) INJECTION 61% COMPARISON:  06/21/2017 FINDINGS: CT CHEST FINDINGS Cardiovascular: Right Port-A-Cath tip: Cavoatrial junction. Mild atherosclerotic calcification of the aortic arch. Mediastinum/Nodes: Unremarkable Lungs/Pleura:   Severe centrilobular emphysema. No current findings of pulmonary metastatic disease. Musculoskeletal: Lower thoracic spondylosis. Lower cervical spondylosis. CT ABDOMEN PELVIS FINDINGS Hepatobiliary: The scattered previous metastatic lesions throughout the liver demonstrate a greater degree of central necrosis with central fluid density. The portal venous phase images suggests some improvement in size, for example a 1.3 by 1.1 cm lesion in the right hepatic lobe on image 110/2 currently measures 1.1 by 1.0 cm. A separate right hepatic lobe lesion measuring 1.4 by 1.2 cm previously measured 1.6 by 1.4 cm. However, the arterial phase images demonstrate that the portal venous phase images underestimate lesion size, with a larger rind of arterial phase imaging noted. I do not have arterial phase images on the prior exam from 06/21/2017 to compare to, which is why I used portal venous phase appearance to assess the change from prior. The common bile duct measures 6 mm in diameter, stable. Pancreas: Infiltrative tumor continues to encase the superior mesenteric artery, with total tumor size measured at 3.2 by 2.5 cm on image 112/6 (formerly 4.5 by 3.5 cm). Continued dilation of the dorsal pancreatic duct extending into the pancreatic head where there is abrupt truncation related to the tumor. Regional ill definition of soft tissue planes due to both edema and  cachexia. In addition there is a cystic lesion in the pancreatic tail measuring 1.1 by 1.3 cm at the tip of the pancreatic tail, which was previously mildly hyperenhancing relative to the pancreas. Spleen: Unremarkable Adrenals/Urinary Tract: Adrenal glands normal. Stable bilateral renal cysts. Stomach/Bowel: Severe cachexia and low-grade mesenteric edema makes separation of adjacent structures problematic. Prominent stool throughout the colon favors constipation. Vascular/Lymphatic: As noted above there is continued encasement of the superior mesenteric artery by tumor, with some localized narrowing of the SMA on image 108/6. The SMV remains completely occluded with some collateralization along the mesentery and omentum. The hepatic artery seems to arise from the celiac trunk in a conventional fashion. Aortoiliac atherosclerotic vascular disease. Reduced sensitivity for adenopathy due to the severe cachexia. Reproductive: Unremarkable Other: Severe cachexia and low-grade mesenteric edema. Small amount of pelvic ascites similar to prior. Musculoskeletal: Unremarkable IMPRESSION: 1. Reduced size of the primary pancreatic neoplasm, which still continues to encase the superior mesenteric artery and occlude the superior mesenteric vein. The numerous metastatic liver lesions show some improvement on portal venous phase images, with increase in the central necrosis and slight reduction in size. However, the arterial phase images, which are present today but were not available on the prior exam, demonstrate that there is a significant peripheral rind around each of these lesions and that the portal venous phase images underestimate total lesions size. No findings of metastatic disease to the chest or skeleton. 2. Severe and worsened cachexia. 3. Low-grade mesenteric edema and a small amount of pelvic ascites. 4. Aortic Atherosclerosis (ICD10-I70.0) and Emphysema (ICD10-J43.9). 5.  Prominent stool throughout the colon  favors constipation. Electronically Signed   By: Van Clines M.D.   On: 10/02/2017 13:51   Ct Abdomen Pelvis W Contrast  Result Date: 10/02/2017 CLINICAL DATA:  Restaging of metastatic pancreatic cancer originally diagnosed in November 2018. Chemotherapy in progress. EXAM: CT CHEST, ABDOMEN, AND PELVIS WITH CONTRAST TECHNIQUE: Multidetector CT imaging of the chest, abdomen and pelvis was performed following the standard protocol during bolus administration of intravenous contrast. CONTRAST:  121m ISOVUE-300 IOPAMIDOL (ISOVUE-300) INJECTION 61% COMPARISON:  06/21/2017 FINDINGS: CT CHEST FINDINGS Cardiovascular: Right Port-A-Cath tip: Cavoatrial junction. Mild atherosclerotic calcification of the aortic arch. Mediastinum/Nodes: Unremarkable Lungs/Pleura: Severe  centrilobular emphysema. No current findings of pulmonary metastatic disease. Musculoskeletal: Lower thoracic spondylosis. Lower cervical spondylosis. CT ABDOMEN PELVIS FINDINGS Hepatobiliary: The scattered previous metastatic lesions throughout the liver demonstrate a greater degree of central necrosis with central fluid density. The portal venous phase images suggests some improvement in size, for example a 1.3 by 1.1 cm lesion in the right hepatic lobe on image 110/2 currently measures 1.1 by 1.0 cm. A separate right hepatic lobe lesion measuring 1.4 by 1.2 cm previously measured 1.6 by 1.4 cm. However, the arterial phase images demonstrate that the portal venous phase images underestimate lesion size, with a larger rind of arterial phase imaging noted. I do not have arterial phase images on the prior exam from 06/21/2017 to compare to, which is why I used portal venous phase appearance to assess the change from prior. The common bile duct measures 6 mm in diameter, stable. Pancreas: Infiltrative tumor continues to encase the superior mesenteric artery, with total tumor size measured at 3.2 by 2.5 cm on image 112/6 (formerly 4.5 by 3.5 cm).  Continued dilation of the dorsal pancreatic duct extending into the pancreatic head where there is abrupt truncation related to the tumor. Regional ill definition of soft tissue planes due to both edema and cachexia. In addition there is a cystic lesion in the pancreatic tail measuring 1.1 by 1.3 cm at the tip of the pancreatic tail, which was previously mildly hyperenhancing relative to the pancreas. Spleen: Unremarkable Adrenals/Urinary Tract: Adrenal glands normal. Stable bilateral renal cysts. Stomach/Bowel: Severe cachexia and low-grade mesenteric edema makes separation of adjacent structures problematic. Prominent stool throughout the colon favors constipation. Vascular/Lymphatic: As noted above there is continued encasement of the superior mesenteric artery by tumor, with some localized narrowing of the SMA on image 108/6. The SMV remains completely occluded with some collateralization along the mesentery and omentum. The hepatic artery seems to arise from the celiac trunk in a conventional fashion. Aortoiliac atherosclerotic vascular disease. Reduced sensitivity for adenopathy due to the severe cachexia. Reproductive: Unremarkable Other: Severe cachexia and low-grade mesenteric edema. Small amount of pelvic ascites similar to prior. Musculoskeletal: Unremarkable IMPRESSION: 1. Reduced size of the primary pancreatic neoplasm, which still continues to encase the superior mesenteric artery and occlude the superior mesenteric vein. The numerous metastatic liver lesions show some improvement on portal venous phase images, with increase in the central necrosis and slight reduction in size. However, the arterial phase images, which are present today but were not available on the prior exam, demonstrate that there is a significant peripheral rind around each of these lesions and that the portal venous phase images underestimate total lesions size. No findings of metastatic disease to the chest or skeleton. 2. Severe  and worsened cachexia. 3. Low-grade mesenteric edema and a small amount of pelvic ascites. 4. Aortic Atherosclerosis (ICD10-I70.0) and Emphysema (ICD10-J43.9). 5.  Prominent stool throughout the colon favors constipation. Electronically Signed   By: Van Clines M.D.   On: 10/02/2017 13:51   CT CAP W Contrast 10/02/17 IMPRESSION: 1. Reduced size of the primary pancreatic neoplasm, which still continues to encase the superior mesenteric artery and occlude the superior mesenteric vein. The numerous metastatic liver lesions show some improvement on portal venous phase images, with increase in the central necrosis and slight reduction in size. However, the arterial phase images, which are present today but were not available on the prior exam, demonstrate that there is a significant peripheral rind around each of these lesions and that the portal venous  phase images underestimate total lesions size. No findings of metastatic disease to the chest or skeleton. 2. Severe and worsened cachexia. 3. Low-grade mesenteric edema and a small amount of pelvic ascites. 4. Aortic Atherosclerosis (ICD10-I70.0) and Emphysema (ICD10-J43.9). 5.  Prominent stool throughout the colon favors constipation.  CT AP with contrast, 06/21/17 IMPRESSION: 1. Indistinct low-attenuation mass in the uncinate process of the pancreas with dilatation of remainder of the pancreatic duct and some encasement of the SMA. These findings are highly indicative of pancreatic carcinoma. Recommend MRI to assess further. 2. Multiple low-attenuation lesions throughout the right and left lobes of liver most consistent with liver metastasis again MRI may be helpful. 3. Moderate change of abdominal aortic atherosclerosis. 4. Changes of centrilobular and paraseptal emphysema on CT of the chest. No lung lesion is seen.  ASSESSMENT & PLAN: 66 y.o. African-American female, with past medical history of anxiety, otherwise healthy, presented with  chronic diarrhea for 4 months, poor appetite and 35lbs weight loss.  CT scan showed a pancreatic head mass and multiple liver metastasis.  1. 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 -Her tumor has MSI stable, not a candidate for immunotherapy.  I will refer her to genetics to see if she has BRCA mutations.  She does have family history of pancreatic cancer. -She has started first-line chemotherapy with gemcitabine and Abraxane, she tolerated the first cycle week 1 and 2 very well, with minimal side effects. -Restaging CT CAP from 10/02/17 reveals good partial response, with reduced size of the primary pancreatic neoplasm, the numerous metastatic liver lesions show some improvement on portal venous phase images, with increase in the central necrosis and slight reduction in size. No findings of metastatic disease to the chest or skeleton. I discussed results with pt and her daughter and they are pleased -lab reviewed. RBC at 3.02, Hgb at 9.5, HCT at 28.6, platelets at 319, as of today, 10/05/2017  -Patient is clinically doing well, lab reviewed, adequate for treatment, will start cycle 4 gemcitabine and Abraxane today -F/u in 3 weeks before cycle 5   2. Diarrhea  -Probable related to her pancreatic malignancy and chemo  -she feels creon is making her diarrhea worse, will stop  -continue taking it if her symptoms resume. She can also use imodium and I order lomotil for her to fill if she needs.   3.  Anorexia and weight loss -She has been drinking boost 3 bottles a day, which has been stabilized lately -She has used marijuana in the past, still has some at home, she will try marijuana for her anorexia. -I previously  advised her to ween of Ativan to once daily.  -She was prescribed Mirtazapine on 07/11/17 to help with her appetite.  -she is eating better, weight stable lately  -Her appetite has improved with Rx Marinol. She is also using marijuana at home to aid with increasing her appetite.  -Continue on Rx Marinol, will discuss increasing the dosage if needed.   4. Smoking Cessation  -She is interested in quitting smoking, we previously encouraged her. She does not want to try nicotine products. She is asking about Chantix, has apt with PCP next week, previously recommended she discuss this with PCP.   5. Hypokalemia  --her K is 2.9 today,   I will order a K supplement for her to start taking. She will take 20meq bid for 5 days then once daily. - This is likely due to diarrhea  6. Goal of care discussion  -We again discussed the incurable nature of her cancer, and the overall poor prognosis, especially if she does not have good response to chemotherapy or progress on chemo -The patient understands the goal of care is palliative. -I recommend DNR/DNI, she will think about it    Plan  -Stating CT scan reviewed with patient, she has had a good partial response  -start cycle 4 gemcitabine and Abraxane today  -Start K supplement, order today -ordered lomotil today for her to use if she needs  -F/u in 3 weeks   -genetic referral    All questions were answered. The patient knows to call the clinic with any problems, questions or concerns.  I spent 20 minutes counseling the patient face to face. The total time spent in the appointment was 25 minutes and more than 50% was on counseling.  This document serves as a record of services personally performed by Yan Feng, MD. It was created on her behalf by Dana Waskiewicz, a trained medical scribe. The creation of this record is based on the scribe's personal observations and the provider's statements to them.   I have reviewed the above documentation for  accuracy and completeness, and I agree with the above.    Yan Feng, MD 10/05/17    

## 2017-10-05 ENCOUNTER — Encounter: Payer: Self-pay | Admitting: Hematology

## 2017-10-05 ENCOUNTER — Inpatient Hospital Stay: Payer: Medicare Other

## 2017-10-05 ENCOUNTER — Inpatient Hospital Stay (HOSPITAL_BASED_OUTPATIENT_CLINIC_OR_DEPARTMENT_OTHER): Payer: Medicare Other | Admitting: Hematology

## 2017-10-05 ENCOUNTER — Telehealth: Payer: Self-pay

## 2017-10-05 VITALS — BP 92/78 | HR 63 | Temp 97.5°F | Resp 17 | Ht 64.0 in | Wt 82.2 lb

## 2017-10-05 DIAGNOSIS — R197 Diarrhea, unspecified: Secondary | ICD-10-CM | POA: Diagnosis not present

## 2017-10-05 DIAGNOSIS — C259 Malignant neoplasm of pancreas, unspecified: Secondary | ICD-10-CM

## 2017-10-05 DIAGNOSIS — R630 Anorexia: Secondary | ICD-10-CM

## 2017-10-05 DIAGNOSIS — F1290 Cannabis use, unspecified, uncomplicated: Secondary | ICD-10-CM

## 2017-10-05 DIAGNOSIS — C787 Secondary malignant neoplasm of liver and intrahepatic bile duct: Principal | ICD-10-CM

## 2017-10-05 DIAGNOSIS — Z79899 Other long term (current) drug therapy: Secondary | ICD-10-CM | POA: Diagnosis not present

## 2017-10-05 DIAGNOSIS — Z7189 Other specified counseling: Secondary | ICD-10-CM

## 2017-10-05 DIAGNOSIS — R634 Abnormal weight loss: Secondary | ICD-10-CM

## 2017-10-05 DIAGNOSIS — Z8042 Family history of malignant neoplasm of prostate: Secondary | ICD-10-CM

## 2017-10-05 DIAGNOSIS — R5383 Other fatigue: Secondary | ICD-10-CM

## 2017-10-05 DIAGNOSIS — E876 Hypokalemia: Secondary | ICD-10-CM | POA: Diagnosis not present

## 2017-10-05 DIAGNOSIS — Z5111 Encounter for antineoplastic chemotherapy: Secondary | ICD-10-CM | POA: Diagnosis not present

## 2017-10-05 DIAGNOSIS — F17210 Nicotine dependence, cigarettes, uncomplicated: Secondary | ICD-10-CM

## 2017-10-05 DIAGNOSIS — Z800 Family history of malignant neoplasm of digestive organs: Secondary | ICD-10-CM

## 2017-10-05 LAB — CBC WITH DIFFERENTIAL/PLATELET
BASOS ABS: 0 10*3/uL (ref 0.0–0.1)
BASOS PCT: 1 %
EOS PCT: 2 %
Eosinophils Absolute: 0.1 10*3/uL (ref 0.0–0.5)
HCT: 28.6 % — ABNORMAL LOW (ref 34.8–46.6)
Hemoglobin: 9.5 g/dL — ABNORMAL LOW (ref 11.6–15.9)
Lymphocytes Relative: 33 %
Lymphs Abs: 1.7 10*3/uL (ref 0.9–3.3)
MCH: 31.5 pg (ref 25.1–34.0)
MCHC: 33.2 g/dL (ref 31.5–36.0)
MCV: 94.7 fL (ref 79.5–101.0)
MONO ABS: 0.6 10*3/uL (ref 0.1–0.9)
Monocytes Relative: 12 %
Neutro Abs: 2.7 10*3/uL (ref 1.5–6.5)
Neutrophils Relative %: 52 %
PLATELETS: 319 10*3/uL (ref 145–400)
RBC: 3.02 MIL/uL — AB (ref 3.70–5.45)
RDW: 18 % — AB (ref 11.2–14.5)
WBC: 5 10*3/uL (ref 3.9–10.3)

## 2017-10-05 LAB — COMPREHENSIVE METABOLIC PANEL
ALBUMIN: 3.4 g/dL — AB (ref 3.5–5.0)
ALK PHOS: 59 U/L (ref 40–150)
ALT: 13 U/L (ref 0–55)
ANION GAP: 9 (ref 3–11)
AST: 16 U/L (ref 5–34)
BILIRUBIN TOTAL: 0.3 mg/dL (ref 0.2–1.2)
BUN: 12 mg/dL (ref 7–26)
CALCIUM: 9.1 mg/dL (ref 8.4–10.4)
CO2: 21 mmol/L — ABNORMAL LOW (ref 22–29)
Chloride: 110 mmol/L — ABNORMAL HIGH (ref 98–109)
Creatinine, Ser: 0.86 mg/dL (ref 0.60–1.10)
GFR calc Af Amer: >60 mL/min (ref >60)
GLUCOSE: 88 mg/dL (ref 70–140)
POTASSIUM: 2.9 mmol/L — AB (ref 3.5–5.1)
Sodium: 140 mmol/L (ref 136–145)
TOTAL PROTEIN: 7.1 g/dL (ref 6.4–8.3)

## 2017-10-05 MED ORDER — POTASSIUM CHLORIDE CRYS ER 20 MEQ PO TBCR
20.0000 meq | EXTENDED_RELEASE_TABLET | Freq: Once | ORAL | Status: AC
  Administered 2017-10-05: 20 meq via ORAL

## 2017-10-05 MED ORDER — POTASSIUM CHLORIDE CRYS ER 20 MEQ PO TBCR
EXTENDED_RELEASE_TABLET | ORAL | Status: AC
  Filled 2017-10-05: qty 1

## 2017-10-05 MED ORDER — HEPARIN SOD (PORK) LOCK FLUSH 100 UNIT/ML IV SOLN
500.0000 [IU] | Freq: Once | INTRAVENOUS | Status: AC | PRN
  Administered 2017-10-05: 500 [IU]
  Filled 2017-10-05: qty 5

## 2017-10-05 MED ORDER — PROCHLORPERAZINE MALEATE 10 MG PO TABS
10.0000 mg | ORAL_TABLET | Freq: Once | ORAL | Status: AC
  Administered 2017-10-05: 10 mg via ORAL

## 2017-10-05 MED ORDER — PACLITAXEL PROTEIN-BOUND CHEMO INJECTION 100 MG
125.0000 mg/m2 | Freq: Once | Status: AC
  Administered 2017-10-05: 175 mg via INTRAVENOUS
  Filled 2017-10-05: qty 35

## 2017-10-05 MED ORDER — DIPHENOXYLATE-ATROPINE 2.5-0.025 MG PO TABS: 1.0000 | ORAL_TABLET | Freq: Four times a day (QID) | ORAL | 2 refills | Status: DC | PRN

## 2017-10-05 MED ORDER — PROCHLORPERAZINE MALEATE 10 MG PO TABS
ORAL_TABLET | ORAL | Status: AC
  Filled 2017-10-05: qty 1

## 2017-10-05 MED ORDER — SODIUM CHLORIDE 0.9 % IV SOLN
800.0000 mg/m2 | Freq: Once | INTRAVENOUS | Status: AC
  Administered 2017-10-05: 1064 mg via INTRAVENOUS
  Filled 2017-10-05: qty 27.98

## 2017-10-05 MED ORDER — SODIUM CHLORIDE 0.9% FLUSH
10.0000 mL | Freq: Once | INTRAVENOUS | Status: AC
  Administered 2017-10-05: 10 mL
  Filled 2017-10-05: qty 10

## 2017-10-05 MED ORDER — SODIUM CHLORIDE 0.9% FLUSH
10.0000 mL | INTRAVENOUS | Status: DC | PRN
  Administered 2017-10-05: 10 mL
  Filled 2017-10-05: qty 10

## 2017-10-05 MED ORDER — POTASSIUM CHLORIDE CRYS ER 20 MEQ PO TBCR: 20.0000 meq | EXTENDED_RELEASE_TABLET | Freq: Every day | ORAL | 1 refills | Status: DC

## 2017-10-05 MED ORDER — SODIUM CHLORIDE 0.9 % IV SOLN
Freq: Once | INTRAVENOUS | Status: AC
  Administered 2017-10-05: 12:00:00 via INTRAVENOUS

## 2017-10-05 NOTE — Patient Instructions (Signed)
[facility] Discharge Instructions for Patients Receiving Chemotherapy  Today you received the following chemotherapy agents: Abraxane and Gemzar   To help prevent nausea and vomiting after your treatment, we encourage you to take your nausea medication as directed.    If you develop nausea and vomiting that is not controlled by your nausea medication, call the clinic.   BELOW ARE SYMPTOMS THAT SHOULD BE REPORTED IMMEDIATELY:  *FEVER GREATER THAN 100.5 F  *CHILLS WITH OR WITHOUT FEVER  NAUSEA AND VOMITING THAT IS NOT CONTROLLED WITH YOUR NAUSEA MEDICATION  *UNUSUAL SHORTNESS OF BREATH  *UNUSUAL BRUISING OR BLEEDING  TENDERNESS IN MOUTH AND THROAT WITH OR WITHOUT PRESENCE OF ULCERS  *URINARY PROBLEMS  *BOWEL PROBLEMS  UNUSUAL RASH Items with * indicate a potential emergency and should be followed up as soon as possible.  Feel free to call the clinic should you have any questions or concerns. The clinic phone number is [phone].  Please show the CHEMO ALERT CARD at check-in to the Emergency Department and triage nurse.   

## 2017-10-05 NOTE — Telephone Encounter (Signed)
Printed avs and calender of upcoming appointment. Per 2/15 los 

## 2017-10-12 ENCOUNTER — Telehealth: Payer: Self-pay | Admitting: Hematology

## 2017-10-12 ENCOUNTER — Inpatient Hospital Stay: Payer: Medicare Other

## 2017-10-12 ENCOUNTER — Other Ambulatory Visit: Payer: Medicare Other

## 2017-10-12 ENCOUNTER — Other Ambulatory Visit: Payer: Self-pay | Admitting: Hematology

## 2017-10-12 VITALS — BP 100/59 | HR 56 | Temp 97.5°F | Resp 17 | Wt 84.8 lb

## 2017-10-12 DIAGNOSIS — Z5111 Encounter for antineoplastic chemotherapy: Secondary | ICD-10-CM | POA: Diagnosis not present

## 2017-10-12 DIAGNOSIS — C259 Malignant neoplasm of pancreas, unspecified: Secondary | ICD-10-CM

## 2017-10-12 DIAGNOSIS — C787 Secondary malignant neoplasm of liver and intrahepatic bile duct: Principal | ICD-10-CM

## 2017-10-12 LAB — CBC WITH DIFFERENTIAL/PLATELET
BASOS ABS: 0 10*3/uL (ref 0.0–0.1)
BASOS PCT: 2 %
Eosinophils Absolute: 0 10*3/uL (ref 0.0–0.5)
Eosinophils Relative: 1 %
HCT: 28.2 % — ABNORMAL LOW (ref 34.8–46.6)
Hemoglobin: 9.3 g/dL — ABNORMAL LOW (ref 11.6–15.9)
LYMPHS ABS: 1.5 10*3/uL (ref 0.9–3.3)
LYMPHS PCT: 65 %
MCH: 31.9 pg (ref 25.1–34.0)
MCHC: 32.8 g/dL (ref 31.5–36.0)
MCV: 97.1 fL (ref 79.5–101.0)
MONO ABS: 0.1 10*3/uL (ref 0.1–0.9)
Monocytes Relative: 6 %
Neutro Abs: 0.6 10*3/uL — ABNORMAL LOW (ref 1.5–6.5)
Neutrophils Relative %: 26 %
Platelets: 242 10*3/uL (ref 145–400)
RBC: 2.91 MIL/uL — ABNORMAL LOW (ref 3.70–5.45)
RDW: 18.8 % — AB (ref 11.2–14.5)
WBC: 2.2 10*3/uL — AB (ref 3.9–10.3)

## 2017-10-12 LAB — COMPREHENSIVE METABOLIC PANEL
ALBUMIN: 3.3 g/dL — AB (ref 3.5–5.0)
ALT: 26 U/L (ref 0–55)
AST: 29 U/L (ref 5–34)
Alkaline Phosphatase: 64 U/L (ref 40–150)
Anion gap: 8 (ref 3–11)
BUN: 10 mg/dL (ref 7–26)
CHLORIDE: 112 mmol/L — AB (ref 98–109)
CO2: 20 mmol/L — ABNORMAL LOW (ref 22–29)
CREATININE: 0.76 mg/dL (ref 0.60–1.10)
Calcium: 9.3 mg/dL (ref 8.4–10.4)
GFR calc Af Amer: >60 mL/min (ref >60)
GFR calc non Af Amer: >60 mL/min (ref >60)
Glucose, Bld: 86 mg/dL (ref 70–140)
POTASSIUM: 4.2 mmol/L (ref 3.5–5.1)
Sodium: 140 mmol/L (ref 136–145)
Total Bilirubin: <0.2 mg/dL — ABNORMAL LOW (ref 0.2–1.2)
Total Protein: 6.8 g/dL (ref 6.4–8.3)

## 2017-10-12 MED ORDER — HEPARIN SOD (PORK) LOCK FLUSH 100 UNIT/ML IV SOLN
500.0000 [IU] | Freq: Once | INTRAVENOUS | Status: AC
  Administered 2017-10-12: 500 [IU]
  Filled 2017-10-12: qty 5

## 2017-10-12 MED ORDER — SODIUM CHLORIDE 0.9% FLUSH
10.0000 mL | Freq: Once | INTRAVENOUS | Status: AC
  Administered 2017-10-12: 10 mL
  Filled 2017-10-12: qty 10

## 2017-10-12 NOTE — Telephone Encounter (Signed)
Scheduled appt per 2/22 sch message - patient is aware of appt date and time.

## 2017-10-12 NOTE — Patient Instructions (Signed)
Neutropenia Neutropenia is a condition that occurs when you have a lower-than-normal level of a type of white blood cell (neutrophil) in your body. Neutrophils are made in the spongy center of large bones (bone marrow) and they fight infections. Neutrophils are your body's main defense against bacterial and fungal infections. The fewer neutrophils you have and the longer your body remains without them, the greater your risk of getting a severe infection. What are the causes? This condition can occur if your body uses up or destroys neutrophils faster than your bone marrow can make them. This problem may happen because of:  Bacterial or fungal infection.  Allergic disorders.  Reactions to some medicines.  Autoimmune disease.  An enlarged spleen.  This condition can also occur if your bone marrow does not produce enough neutrophils. This problem may be caused by:  Cancer.  Cancer treatments, such as radiation or chemotherapy.  Viral infections.  Medicines, such as phenytoin.  Vitamin B12 deficiency.  Diseases of the bone marrow.  Environmental toxins, such as insecticides.  What are the signs or symptoms? This condition does not usually cause symptoms. If symptoms are present, they are usually caused by an underlying infection. Symptoms of an infection may include:  Fever.  Chills.  Swollen glands.  Oral or anal ulcers.  Cough and shortness of breath.  Rash.  Skin infection.  Fatigue.  How is this diagnosed? Your health care provider may suspect neutropenia if you have:  A condition that may cause neutropenia.  Symptoms of infection, especially fever.  Frequent and unusual infections.  You will have a medical history and physical exam. Tests will also be done, such as:  A complete blood count (CBC).  A procedure to collect a sample of bone marrow for examination (bone marrow biopsy).  A chest X-ray.  A urine culture.  A blood culture.  How is this  treated? Treatment depends on the underlying cause and severity of your condition. Mild neutropenia may not require treatment. Treatment may include medicines, such as:  Antibiotic medicine given through an IV tube.  Antiviral medicines.  Antifungal medicines.  A medicine to increase neutrophil production (colony-stimulating factor). You may get this drug through an IV tube or by injection.  Steroids given through an IV tube.  If an underlying condition is causing neutropenia, you may need treatment for that condition. If medicines you are taking are causing neutropenia, your health care provider may have you stop taking those medicines. Follow these instructions at home: Medicines  Take over-the-counter and prescription medicines only as told by your health care provider.  Get a seasonal flu shot (influenza vaccine). Lifestyle  Do not eat unpasteurized foods.Do not eat unwashed raw fruits or vegetables.  Avoid exposure to groups of people or children.  Avoid being around people who are sick.  Avoid being around dirt or dust, such as in construction areas or gardens.  Do not provide direct care for pets. Avoid animal droppings. Do not clean litter boxes and bird cages. Hygiene   Bathe daily.  Clean the area between the genitals and the anus (perineal area) after you urinate or have a bowel movement. If you are female, wipe from front to back.  Brush your teeth with a soft toothbrush before and after meals.  Do not use a razor that has a blade. Use an electric razor to remove hair.  Wash your hands often. Make sure others who come in contact with you also wash their hands. If soap and water  are not available, use hand sanitizer. General instructions  Do not have sex unless your health care provider has approved.  Take actions to avoid cuts and burns. For example: ? Be cautious when you use knives. Always cut away from yourself. ? Keep knives in protective sheaths or  guards when not in use. ? Use oven mitts when you cook with a hot stove, oven, or grill. ? Stand a safe distance away from open fires.  Avoid people who received a vaccine in the past 30 days if that vaccine contained a live version of the germ (live vaccine). You should not get a live vaccine. Common live vaccines are varicella, measles, mumps, and rubella.  Do not share food utensils.  Do not use tampons, enemas, or rectal suppositories unless your health care provider has approved.  Keep all appointments as told by your health care provider. This is important. Contact a health care provider if:  You have a fever.  You have chills or you start to shake.  You have: ? A sore throat. ? A warm, red, or tender area on your skin. ? A cough. ? Frequent or painful urination. ? Vaginal discharge or itching.  You develop: ? Sores in your mouth or anus. ? Swollen lymph nodes. ? Red streaks on the skin. ? A rash.  You feel: ? Nauseous or you vomit. ? Very fatigued. ? Short of breath. This information is not intended to replace advice given to you by your health care provider. Make sure you discuss any questions you have with your health care provider. Document Released: 01/27/2002 Document Revised: 01/13/2016 Document Reviewed: 02/17/2015 Elsevier Interactive Patient Education  2018 [employer]  

## 2017-10-15 ENCOUNTER — Telehealth: Payer: Self-pay

## 2017-10-16 NOTE — Telephone Encounter (Signed)
Error

## 2017-10-19 ENCOUNTER — Inpatient Hospital Stay: Payer: Medicare Other | Attending: Hematology

## 2017-10-19 ENCOUNTER — Inpatient Hospital Stay: Payer: Medicare Other

## 2017-10-19 VITALS — BP 96/54 | HR 65 | Temp 97.8°F | Resp 18

## 2017-10-19 DIAGNOSIS — D649 Anemia, unspecified: Secondary | ICD-10-CM | POA: Insufficient documentation

## 2017-10-19 DIAGNOSIS — Z5111 Encounter for antineoplastic chemotherapy: Secondary | ICD-10-CM | POA: Diagnosis not present

## 2017-10-19 DIAGNOSIS — C787 Secondary malignant neoplasm of liver and intrahepatic bile duct: Principal | ICD-10-CM

## 2017-10-19 DIAGNOSIS — C259 Malignant neoplasm of pancreas, unspecified: Secondary | ICD-10-CM

## 2017-10-19 DIAGNOSIS — R630 Anorexia: Secondary | ICD-10-CM | POA: Diagnosis not present

## 2017-10-19 DIAGNOSIS — R197 Diarrhea, unspecified: Secondary | ICD-10-CM | POA: Diagnosis not present

## 2017-10-19 DIAGNOSIS — F17210 Nicotine dependence, cigarettes, uncomplicated: Secondary | ICD-10-CM | POA: Insufficient documentation

## 2017-10-19 DIAGNOSIS — Z79899 Other long term (current) drug therapy: Secondary | ICD-10-CM | POA: Insufficient documentation

## 2017-10-19 DIAGNOSIS — F419 Anxiety disorder, unspecified: Secondary | ICD-10-CM | POA: Insufficient documentation

## 2017-10-19 DIAGNOSIS — R634 Abnormal weight loss: Secondary | ICD-10-CM | POA: Diagnosis not present

## 2017-10-19 DIAGNOSIS — D701 Agranulocytosis secondary to cancer chemotherapy: Secondary | ICD-10-CM | POA: Insufficient documentation

## 2017-10-19 DIAGNOSIS — C250 Malignant neoplasm of head of pancreas: Secondary | ICD-10-CM | POA: Diagnosis not present

## 2017-10-19 DIAGNOSIS — E876 Hypokalemia: Secondary | ICD-10-CM | POA: Diagnosis not present

## 2017-10-19 DIAGNOSIS — T451X5S Adverse effect of antineoplastic and immunosuppressive drugs, sequela: Secondary | ICD-10-CM | POA: Insufficient documentation

## 2017-10-19 DIAGNOSIS — Z7189 Other specified counseling: Secondary | ICD-10-CM

## 2017-10-19 LAB — COMPREHENSIVE METABOLIC PANEL
ALBUMIN: 3.3 g/dL — AB (ref 3.5–5.0)
ALT: 16 U/L (ref 0–55)
AST: 16 U/L (ref 5–34)
Alkaline Phosphatase: 70 U/L (ref 40–150)
Anion gap: 9 (ref 3–11)
BUN: 12 mg/dL (ref 7–26)
CHLORIDE: 109 mmol/L (ref 98–109)
CO2: 21 mmol/L — ABNORMAL LOW (ref 22–29)
Calcium: 9.2 mg/dL (ref 8.4–10.4)
Creatinine, Ser: 0.82 mg/dL (ref 0.60–1.10)
GFR calc Af Amer: >60 mL/min (ref >60)
GFR calc non Af Amer: >60 mL/min (ref >60)
GLUCOSE: 77 mg/dL (ref 70–140)
POTASSIUM: 3.9 mmol/L (ref 3.5–5.1)
SODIUM: 139 mmol/L (ref 136–145)
Total Bilirubin: 0.3 mg/dL (ref 0.2–1.2)
Total Protein: 7 g/dL (ref 6.4–8.3)

## 2017-10-19 LAB — CBC WITH DIFFERENTIAL/PLATELET
BASOS ABS: 0.1 10*3/uL (ref 0.0–0.1)
Basophils Relative: 1 %
EOS PCT: 2 %
Eosinophils Absolute: 0.1 10*3/uL (ref 0.0–0.5)
HEMATOCRIT: 29.9 % — AB (ref 34.8–46.6)
Hemoglobin: 9.8 g/dL — ABNORMAL LOW (ref 11.6–15.9)
LYMPHS PCT: 23 %
Lymphs Abs: 1.8 10*3/uL (ref 0.9–3.3)
MCH: 32.6 pg (ref 25.1–34.0)
MCHC: 32.9 g/dL (ref 31.5–36.0)
MCV: 99.2 fL (ref 79.5–101.0)
MONO ABS: 0.9 10*3/uL (ref 0.1–0.9)
Monocytes Relative: 12 %
NEUTROS ABS: 4.9 10*3/uL (ref 1.5–6.5)
Neutrophils Relative %: 62 %
PLATELETS: 139 10*3/uL — AB (ref 145–400)
RBC: 3.02 MIL/uL — AB (ref 3.70–5.45)
RDW: 20.6 % — ABNORMAL HIGH (ref 11.2–14.5)
WBC: 7.8 10*3/uL (ref 3.9–10.3)

## 2017-10-19 MED ORDER — SODIUM CHLORIDE 0.9 % IV SOLN
Freq: Once | INTRAVENOUS | Status: AC
  Administered 2017-10-19: 13:00:00 via INTRAVENOUS

## 2017-10-19 MED ORDER — PROCHLORPERAZINE MALEATE 10 MG PO TABS
ORAL_TABLET | ORAL | Status: AC
  Filled 2017-10-19: qty 1

## 2017-10-19 MED ORDER — SODIUM CHLORIDE 0.9% FLUSH
10.0000 mL | Freq: Once | INTRAVENOUS | Status: AC
  Administered 2017-10-19: 10 mL
  Filled 2017-10-19: qty 10

## 2017-10-19 MED ORDER — SODIUM CHLORIDE 0.9 % IV SOLN
800.0000 mg/m2 | Freq: Once | INTRAVENOUS | Status: AC
  Administered 2017-10-19: 1064 mg via INTRAVENOUS
  Filled 2017-10-19: qty 27.98

## 2017-10-19 MED ORDER — PACLITAXEL PROTEIN-BOUND CHEMO INJECTION 100 MG
100.0000 mg/m2 | Freq: Once | INTRAVENOUS | Status: AC
  Administered 2017-10-19: 125 mg via INTRAVENOUS
  Filled 2017-10-19: qty 25

## 2017-10-19 MED ORDER — HEPARIN SOD (PORK) LOCK FLUSH 100 UNIT/ML IV SOLN
500.0000 [IU] | Freq: Once | INTRAVENOUS | Status: AC | PRN
  Administered 2017-10-19: 500 [IU]
  Filled 2017-10-19: qty 5

## 2017-10-19 MED ORDER — SODIUM CHLORIDE 0.9% FLUSH
10.0000 mL | INTRAVENOUS | Status: DC | PRN
  Administered 2017-10-19: 10 mL
  Filled 2017-10-19: qty 10

## 2017-10-19 MED ORDER — PROCHLORPERAZINE MALEATE 10 MG PO TABS
10.0000 mg | ORAL_TABLET | Freq: Once | ORAL | Status: AC
  Administered 2017-10-19: 10 mg via ORAL

## 2017-10-19 NOTE — Patient Instructions (Signed)
[facility] Discharge Instructions for Patients Receiving Chemotherapy  Today you received the following chemotherapy agents: Abraxane and Gemzar   To help prevent nausea and vomiting after your treatment, we encourage you to take your nausea medication as directed.    If you develop nausea and vomiting that is not controlled by your nausea medication, call the clinic.   BELOW ARE SYMPTOMS THAT SHOULD BE REPORTED IMMEDIATELY:  *FEVER GREATER THAN 100.5 F  *CHILLS WITH OR WITHOUT FEVER  NAUSEA AND VOMITING THAT IS NOT CONTROLLED WITH YOUR NAUSEA MEDICATION  *UNUSUAL SHORTNESS OF BREATH  *UNUSUAL BRUISING OR BLEEDING  TENDERNESS IN MOUTH AND THROAT WITH OR WITHOUT PRESENCE OF ULCERS  *URINARY PROBLEMS  *BOWEL PROBLEMS  UNUSUAL RASH Items with * indicate a potential emergency and should be followed up as soon as possible.  Feel free to call the clinic should you have any questions or concerns. The clinic phone number is [phone].  Please show the CHEMO ALERT CARD at check-in to the Emergency Department and triage nurse.   

## 2017-10-26 ENCOUNTER — Inpatient Hospital Stay: Payer: Medicare Other

## 2017-10-26 ENCOUNTER — Inpatient Hospital Stay (HOSPITAL_BASED_OUTPATIENT_CLINIC_OR_DEPARTMENT_OTHER): Payer: Medicare Other | Admitting: Nurse Practitioner

## 2017-10-26 VITALS — BP 106/60 | HR 73 | Temp 97.8°F | Resp 20 | Ht 64.0 in | Wt 89.1 lb

## 2017-10-26 DIAGNOSIS — D649 Anemia, unspecified: Secondary | ICD-10-CM

## 2017-10-26 DIAGNOSIS — D701 Agranulocytosis secondary to cancer chemotherapy: Secondary | ICD-10-CM | POA: Diagnosis not present

## 2017-10-26 DIAGNOSIS — C787 Secondary malignant neoplasm of liver and intrahepatic bile duct: Secondary | ICD-10-CM

## 2017-10-26 DIAGNOSIS — T451X5A Adverse effect of antineoplastic and immunosuppressive drugs, initial encounter: Secondary | ICD-10-CM

## 2017-10-26 DIAGNOSIS — C250 Malignant neoplasm of head of pancreas: Secondary | ICD-10-CM

## 2017-10-26 DIAGNOSIS — R197 Diarrhea, unspecified: Secondary | ICD-10-CM | POA: Diagnosis not present

## 2017-10-26 DIAGNOSIS — C259 Malignant neoplasm of pancreas, unspecified: Secondary | ICD-10-CM

## 2017-10-26 DIAGNOSIS — Z5111 Encounter for antineoplastic chemotherapy: Secondary | ICD-10-CM | POA: Diagnosis not present

## 2017-10-26 DIAGNOSIS — T451X5S Adverse effect of antineoplastic and immunosuppressive drugs, sequela: Secondary | ICD-10-CM | POA: Diagnosis not present

## 2017-10-26 LAB — CBC WITH DIFFERENTIAL/PLATELET
BASOS ABS: 0 10*3/uL (ref 0.0–0.1)
Basophils Relative: 2 %
EOS PCT: 1 %
Eosinophils Absolute: 0 10*3/uL (ref 0.0–0.5)
HCT: 27 % — ABNORMAL LOW (ref 34.8–46.6)
Hemoglobin: 8.8 g/dL — ABNORMAL LOW (ref 11.6–15.9)
LYMPHS PCT: 56 %
Lymphs Abs: 1.5 10*3/uL (ref 0.9–3.3)
MCH: 32.5 pg (ref 25.1–34.0)
MCHC: 32.5 g/dL (ref 31.5–36.0)
MCV: 99.9 fL (ref 79.5–101.0)
MONO ABS: 0.2 10*3/uL (ref 0.1–0.9)
Monocytes Relative: 6 %
Neutro Abs: 0.9 10*3/uL — ABNORMAL LOW (ref 1.5–6.5)
Neutrophils Relative %: 35 %
PLATELETS: 190 10*3/uL (ref 145–400)
RBC: 2.7 MIL/uL — ABNORMAL LOW (ref 3.70–5.45)
RDW: 20.2 % — AB (ref 11.2–14.5)
WBC: 2.7 10*3/uL — ABNORMAL LOW (ref 3.9–10.3)

## 2017-10-26 LAB — COMPREHENSIVE METABOLIC PANEL
ALT: 25 U/L (ref 0–55)
ANION GAP: 8 (ref 3–11)
AST: 24 U/L (ref 5–34)
Albumin: 3.2 g/dL — ABNORMAL LOW (ref 3.5–5.0)
Alkaline Phosphatase: 69 U/L (ref 40–150)
BUN: 11 mg/dL (ref 7–26)
CHLORIDE: 111 mmol/L — AB (ref 98–109)
CO2: 22 mmol/L (ref 22–29)
Calcium: 9 mg/dL (ref 8.4–10.4)
Creatinine, Ser: 0.84 mg/dL (ref 0.60–1.10)
GFR calc Af Amer: >60 mL/min (ref >60)
Glucose, Bld: 126 mg/dL (ref 70–140)
POTASSIUM: 3.7 mmol/L (ref 3.5–5.1)
Sodium: 141 mmol/L (ref 136–145)
Total Bilirubin: 0.3 mg/dL (ref 0.2–1.2)
Total Protein: 6.6 g/dL (ref 6.4–8.3)

## 2017-10-26 MED ORDER — SODIUM CHLORIDE 0.9 % IJ SOLN
10.0000 mL | Freq: Once | INTRAMUSCULAR | Status: AC
  Administered 2017-10-26: 10 mL
  Filled 2017-10-26: qty 10

## 2017-10-26 MED ORDER — HEPARIN SOD (PORK) LOCK FLUSH 100 UNIT/ML IV SOLN
500.0000 [IU] | Freq: Once | INTRAVENOUS | Status: AC
  Administered 2017-10-26: 500 [IU]
  Filled 2017-10-26: qty 5

## 2017-10-26 MED ORDER — SODIUM CHLORIDE 0.9% FLUSH
10.0000 mL | Freq: Once | INTRAVENOUS | Status: AC
  Administered 2017-10-26: 10 mL
  Filled 2017-10-26: qty 10

## 2017-10-26 NOTE — Progress Notes (Signed)
[facility]  Telephone:([phone]) [phone] [phone]  Clinic Follow up Note   Patient Care Team: Wilford Corner, MD as PCP - General (Gastroenterology) Truitt Merle, MD as Consulting Physician (Hematology)  Date of Service: 10/26/17  SUMMARY OF ONCOLOGIC HISTORY:   Pancreatic cancer metastasized to liver ([facility]my [facility])   06/21/2017 Imaging    CT CAP IMPRESSION: 1. Indistinct low-attenuation mass in the uncinate process of the pancreas with dilatation of remainder of the pancreatic duct and some encasement of the SMA. These findings are highly indicative of pancreatic carcinoma. Recommend MRI to assess further. 2. Multiple low-attenuation lesions throughout the right and left lobes of liver most consistent with liver metastasis again MRI may be helpful. 3. Moderate change of abdominal aortic atherosclerosis. 4. Changes of centrilobular and paraseptal emphysema on CT of the chest. No lung lesion is seen.       07/06/2017 Initial Biopsy    Diagnosis Liver, needle/core biopsy, Right lobe - METASTATIC ADENOCARCINOMA.      07/10/2017 Initial Diagnosis    Pancreatic cancer metastasized to liver (Tammy Adams)      07/25/2017 -  Chemotherapy    Gemcitabine and Abraxane 3 weeks on and 1 week off starting 07/25/17       10/02/2017 Imaging    CT CAP W Contrast 10/02/17 IMPRESSION: 1. Reduced size of the primary pancreatic neoplasm, which still continues to encase the superior mesenteric artery and occlude the superior mesenteric vein. The numerous metastatic liver lesions show some improvement on portal venous phase images, with increase in the central necrosis and slight reduction in size. However, the arterial phase images, which are present today but were not available on the prior exam, demonstrate that there is a significant peripheral rind around each of these lesions and that the portal venous phase images underestimate total lesions size. No findings of metastatic  disease to the chest or skeleton. 2. Severe and worsened cachexia. 3. Low-grade mesenteric edema and a small amount of pelvic ascites. 4. Aortic Atherosclerosis (ICD10-I70.0) and Emphysema (ICD10-J43.9). 5.  Prominent stool throughout the colon favors constipation.     CURRENT THERAPY: first line Gemcitabine and Abraxane 2 weeks on and 1 week off started 07/25/17   INTERVAL HISTORY: Tammy Adams returns for follow-up as scheduled prior to cycle 5-day 8 gemcitabine/Abraxane.  She received cycle 5 day 1 on 10/19/2017.  She has been doing well this week with good appetite, she does not take Marinol it is too expensive.  She has gained some weight.  No fever or chills.  Energy level is good.  Diarrhea is controlled by taking Imodium 20 minutes before eating, she has regular BM lately.  No bleeding.  She has had intermittent cough with clear sputum for 2 weeks.  No fever or chills, nasal congestion, sore throat, chest pain or dyspnea.  Denies pain.  No neuropathy.  She is sleeping well.  She has no specific concerns today.  REVIEW OF SYSTEMS:   Constitutional: Denies fevers, chills or abnormal weight loss (+) good appetite Eyes: Denies blurriness of vision Ears, nose, mouth, throat, and face: Denies mucositis or sore throat Respiratory: Denies dyspnea or wheezes (+) intermittent cough with clear sputum, x2 weeks Cardiovascular: Denies palpitation, chest discomfort or lower extremity swelling Gastrointestinal:  Denies nausea, vomiting, constipation, diarrhea, heartburn or change in bowel habits (+) diarrhea controlled by taking Imodium 20 minutes before eating Skin: Denies abnormal skin rashes Lymphatics: Denies new lymphadenopathy or easy bruising Neurological:Denies numbness, tingling or new weaknesses Behavioral/Psych: Mood is stable,  no new changes  All other systems were reviewed with the patient and are negative.  MEDICAL HISTORY:  Past Medical History:  Diagnosis Date  . Anxiety      SURGICAL HISTORY: Past Surgical History:  Procedure Laterality Date  . IR FLUORO GUIDE PORT INSERTION RIGHT  07/24/2017  . IR US GUIDE VASC ACCESS RIGHT  07/24/2017    I have reviewed the social history and family history with the patient and they are unchanged from previous note.  ALLERGIES:  has No Known Allergies.  MEDICATIONS:  Current Outpatient Medications  Medication Sig Dispense Refill  . diphenoxylate-atropine (LOMOTIL) 2.5-0.025 MG tablet Take 1 tablet by mouth 4 (four) times daily as needed for diarrhea or loose stools. 30 tablet 2  . lidocaine-prilocaine (EMLA) cream Apply to affected area once 30 g 3  . LORazepam (ATIVAN) 1 MG tablet Take 1 mg 2 (two) times daily by mouth.    . mirtazapine (REMERON) 15 MG tablet Take 1 tablet (15 mg total) by mouth at bedtime. 30 tablet 3  . Multiple Vitamin (MULTIVITAMIN WITH MINERALS) TABS tablet Take 1 tablet daily by mouth. One-A-Day for Women    . potassium chloride SA (K-DUR,KLOR-CON) 20 MEQ tablet Take 1 tablet (20 mEq total) by mouth daily. 40 tablet 1  . dronabinol (MARINOL) 2.5 MG capsule Take 1 capsule (2.5 mg total) by mouth 2 (two) times daily before a meal. 60 capsule 1  . ondansetron (ZOFRAN) 8 MG tablet Take 1 tablet (8 mg total) by mouth 2 (two) times daily as needed (Nausea or vomiting). (Patient not taking: Reported on 08/24/2017) 30 tablet 1  . prochlorperazine (COMPAZINE) 10 MG tablet Take 1 tablet (10 mg total) by mouth every 6 (six) hours as needed (Nausea or vomiting). (Patient not taking: Reported on 10/26/2017) 30 tablet 1   No current facility-administered medications for this visit.    Facility-Administered Medications Ordered in Other Visits  Medication Dose Route Frequency Provider Last Rate Last Dose  . sodium chloride flush (NS) 0.9 % injection 10 mL  10 mL Intracatheter PRN Truitt Merle, MD   10 mL at 07/25/17 1708    PHYSICAL EXAMINATION: ECOG PERFORMANCE STATUS: 1 - Symptomatic but completely  ambulatory  Vitals:   10/26/17 1142  BP: 106/60  Pulse: 73  Resp: 20  Temp: 97.8 F (36.6 C)  SpO2: 100%   Filed Weights   10/26/17 1142  Weight: 89 lb 1.6 oz (40.4 kg)    GENERAL:alert, no distress and comfortable SKIN: skin color, texture, turgor are normal, no rashes or significant lesions EYES: normal, Conjunctiva are pink and non-injected, sclera clear OROPHARYNX:no exudate, no erythema and lips, buccal mucosa, and tongue normal  LYMPH:  no palpable cervical, supraclavicular, or axillary lymphadenopathy LUNGS: clear to auscultation bilaterally with normal breathing effort HEART: regular rate & rhythm and no murmurs and no lower extremity edema ABDOMEN:abdomen soft, non-tender and normal bowel sounds. No hepatomegaly  Musculoskeletal:no cyanosis of digits and no clubbing  NEURO: alert & oriented x 3 with fluent speech, no focal motor/sensory deficits  PAC without erythema   LABORATORY DATA:  I have reviewed the data as listed CBC Latest Ref Rng & Units 10/26/2017 10/19/2017 10/12/2017  WBC 3.9 - 10.3 K/uL 2.7(L) 7.8 2.2(L)  Hemoglobin 11.6 - 15.9 g/dL 8.8(L) 9.8(L) 9.3(L)  Hematocrit 34.8 - 46.6 % 27.0(L) 29.9(L) 28.2(L)  Platelets 145 - 400 K/uL 190 139(L) 242     CMP Latest Ref Rng & Units 10/26/2017 10/19/2017 10/12/2017  Glucose 70 -  140 mg/dL 126 77 86  BUN 7 - 26 mg/dL _0 Creatinine 0.60 - 1.10 mg/dL 0.84 0.82 0.76  Sodium 136 - 145 mmol/L 141 139 140  Potassium 3.5 - 5.1 mmol/L 3.7 3.9 4.2  Chloride 98 - 109 mmol/L 111(H) 109 112(H)  CO2 22 - 29 mmol/L 22 21(L) 20(L)  Calcium 8.4 - 10.4 mg/dL 9.0 9.2 9.3  Total Protein 6.4 - 8.3 g/dL 6.6 7.0 6.8  Total Bilirubin 0.2 - 1.2 mg/dL 0.3 0.3 <0.2(L)  Alkaline Phos 40 - 150 U/L 69 70 64  AST 5 - 34 U/L _1 ALT 0 - 55 U/L _2 PATHOLOGY: #1 biopsy from duodenum, distal stomach, GE junction no significant pathological changes, except reactive changes in stomach #2 transverse colon, hepatic flexure,  sigmoid, polyps biopsy showed tubular adenomas (3)  Liver Biopsy  Diagnosis 07/06/17 Liver, needle/core biopsy, Right lobe - METASTATIC ADENOCARCINOMA. Microscopic Comment The tumor is positive for cytokeratin 7 and negative for cytokeratin 20, CDX-2 and TTF-1. The immunoprofile is non-specific, but the differential includes a upper GI or pancreaticobiliary primary among others. Dr. Lyndon Code has reviewed the case. Dr. Michail Sermon was paged on 07/10/2017.   RADIOGRAPHIC STUDIES: I have personally reviewed the radiological images as listed and agreed with the findings in the report. No results found.   ASSESSMENT & PLAN: 67 y.o. African-American female, with past medical history of anxiety, otherwise healthy, presented with chronic diarrhea for 4 months, poor appetite and 35lbs weight loss.  CT scan showed a pancreatic head mass and multiple liver metastasis.  1. 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 Diarrhea 3. Anorexia and weight loss 4. Smoking cessation 5. Hypokalemia 6. Goals of care discussion 7. Neutropenia, anemia secondary to chemotherapy   Ms. Mccathern appears stable. She completed cycle 5 day 1 dose-reduced gem/abraxane on 10/19/17. She is tolerating well. Diarrhea is well managed. She has gained weight; followed by nutrition. VS stable. Labs reviewed. K normalized, 3.7 today. She will continue oral potassium supplement 1 tab daily for now given she has intermittent diarrhea. She is neutropenic on CBC, ANC 0.9, anemia slightly worse with Hgb 8.8. She is not symptomatic, does not need blood transfusion at this time. We reviewed neutropenic precautions. Will hold cycle 5 day 8 gemcitabine/abraxane today. Return in 2 weeks for f/u and next chemo.   PLAN -Labs reviewed, ANC 0.9; hold cycle 5 day 8 gem/abraxane today -Return 3/18 for f/u and next treatment  -Reviewed neutropenic precautions -F/u nutrition at next visit   All questions were answered. The  patient knows to call the clinic with any problems, questions or concerns. No barriers to learning was detected.     Alla Feeling, NP 10/27/17

## 2017-10-27 ENCOUNTER — Encounter: Payer: Self-pay | Admitting: Nurse Practitioner

## 2017-11-02 ENCOUNTER — Ambulatory Visit: Payer: Medicare Other

## 2017-11-02 ENCOUNTER — Other Ambulatory Visit: Payer: Medicare Other

## 2017-11-03 NOTE — Progress Notes (Signed)
[facility]  Telephone:([phone]) [phone] Fax:([phone]) [phone]  Clinic Follow Up Note   Patient Care Team: Wilford Corner, MD as PCP - General (Gastroenterology) Truitt Merle, MD as Consulting Physician (Hematology)   Date of Service:  11/05/2017   CHIEF COMPLAINTS  Pancreatic cancer with liver metastasis   HISTORY OF PRESENTING ILLNESS:  Tammy Adams 67 y.o. female is here because of her abnormal CT findings which is highly suspicious for metastatic pancreatic malignancy.  She was referred by her gastroenterologist Dr. Michail Sermon.  She presents to my clinic with her husband today.  She has been having diarrhea for 4 months, she has loose/watery BM after each meals, no pain, nausea, or bloating. She has had low appetite since then, she has been eating very little, she was also recommended to avoid dairy products by her primary care physician, per her husband, she has been taking boost 3 bottles a day lately, she has lost aobut 35 lbs, but has been stable lately since she is taking boosts.   She was initially referred by her primary care physician, and subsequently referred to GI Dr. Michail Sermon.  She underwent EGD and colonoscopy, which showed benign polyps and colon otherwise negative.,  CT chest, abdomen and pelvis was obtained on June 21, 2017, which showed a indistinct low-attenuation mass in the uncinate process of the pancreas with dilatation of pancreatic duct multiple, low-attenuation lesions throughout the right and left lobe of liver most consistent with liver metastasis.  She was referred to IR for liver biopsy this Friday.  She is otherwise healthy, retired from post office, still works part-time as a Oceanographer.  She lives with her husband, who is disabled.  She overall has mild fatigue, but functions very well at home.  No other complaints.   CURRENT THERAPY: first line Gemcitabine and Abraxane 2 weeks on and 1 week off started 07/25/17    INTERVAL HISTORY    Tammy Adams is here for a follow up and chemotherapy treatment. She presents to the infusion room today by herself. She notes she is doing okay overall. She states she did well with her last dose of chemo 2.5 weeks ago.Her energy level is adequate. She will being flying to [place] April 23 for 1 week.   On review of systems, pt denies nausea, vomitting, or any other complaints at this time. Pertinent positives are listed and detailed within the above HPI.   MEDICAL HISTORY:  Past Medical History:  Diagnosis Date  . Anxiety     SURGICAL HISTORY: Past Surgical History:  Procedure Laterality Date  . IR FLUORO GUIDE PORT INSERTION RIGHT  07/24/2017  . IR US GUIDE VASC ACCESS RIGHT  07/24/2017    SOCIAL HISTORY: Social History   Socioeconomic History  . Marital status: Married    Spouse name: Not on file  . Number of children: Not on file  . Years of education: Not on file  . Highest education level: Not on file  Social Needs  . Financial resource strain: Not on file  . Food insecurity - worry: Not on file  . Food insecurity - inability: Not on file  . Transportation needs - medical: Not on file  . Transportation needs - non-medical: Not on file  Occupational History  . Not on file  Tobacco Use  . Smoking status: Current Every Day Smoker    Packs/day: 0.50    Years: 50.00    Pack years: 25.00  . Smokeless tobacco: Never Used  Substance and Sexual  Activity  . Alcohol use: No    Frequency: Never  . Drug use: Yes    Types: Marijuana  . Sexual activity: Not on file  Other Topics Concern  . Not on file  Social History Narrative  . Not on file    FAMILY HISTORY: Family History  Problem Relation Age of Onset  . Cancer Father        prostate cancer   . Cancer Maternal Aunt 70       pancreatic cancer   . Cancer Maternal Grandmother        colon cancer     ALLERGIES:  has No Known Allergies.  MEDICATIONS:  Current Outpatient Medications  Medication Sig Dispense  Refill  . diphenoxylate-atropine (LOMOTIL) 2.5-0.025 MG tablet Take 1 tablet by mouth 4 (four) times daily as needed for diarrhea or loose stools. 30 tablet 2  . dronabinol (MARINOL) 2.5 MG capsule Take 1 capsule (2.5 mg total) by mouth 2 (two) times daily before a meal. 60 capsule 1  . lidocaine-prilocaine (EMLA) cream Apply to affected area once 30 g 3  . LORazepam (ATIVAN) 1 MG tablet Take 1 mg 2 (two) times daily by mouth.    . mirtazapine (REMERON) 15 MG tablet Take 1 tablet (15 mg total) by mouth at bedtime. 30 tablet 3  . Multiple Vitamin (MULTIVITAMIN WITH MINERALS) TABS tablet Take 1 tablet daily by mouth. One-A-Day for Women    . ondansetron (ZOFRAN) 8 MG tablet Take 1 tablet (8 mg total) by mouth 2 (two) times daily as needed (Nausea or vomiting). (Patient not taking: Reported on 08/24/2017) 30 tablet 1  . potassium chloride SA (K-DUR,KLOR-CON) 20 MEQ tablet Take 1 tablet (20 mEq total) by mouth daily. 40 tablet 1  . prochlorperazine (COMPAZINE) 10 MG tablet Take 1 tablet (10 mg total) by mouth every 6 (six) hours as needed (Nausea or vomiting). (Patient not taking: Reported on 10/26/2017) 30 tablet 1   No current facility-administered medications for this visit.    Facility-Administered Medications Ordered in Other Visits  Medication Dose Route Frequency Provider Last Rate Last Dose  . sodium chloride flush (NS) 0.9 % injection 10 mL  10 mL Intracatheter PRN Truitt Merle, MD   10 mL at 07/25/17 1708  . sodium chloride flush (NS) 0.9 % injection 10 mL  10 mL Intracatheter PRN Truitt Merle, MD   10 mL at 11/05/17 1626    REVIEW OF SYSTEMS:  Constitutional: Denies fevers, chills or abnormal night sweats, (+) 35 pound weight loss in the past 4 months, weight stable lately (+) gained 2 lb since last visit at 89 lbs today Eyes: Denies blurriness of vision, double vision or watery eyes Ears, nose, mouth, throat, and face: Denies mucositis or sore throat Respiratory: Denies cough, dyspnea or  wheezes Cardiovascular: Denies palpitation, chest discomfort or lower extremity swelling Gastrointestinal:  Denies nausea, heartburn, Skin: Denies abnormal skin rashes Lymphatics: Denies new lymphadenopathy or easy bruising Neurological:Denies numbness, tingling or new weaknesses Behavioral/Psych: Mood is stable, no new changes  All other systems were reviewed with the patient and are negative.  PHYSICAL EXAMINATION:  ECOG PERFORMANCE STATUS: 1 - Symptomatic but completely ambulatory  BP: 107/73 HR: 68 RR: 18 SPO2: 100% Temp: 36.8 GENERAL:alert, no distress and comfortable SKIN: skin color, texture, turgor are normal, no rashes or significant lesions EYES: normal, conjunctiva are pink and non-injected, sclera clear OROPHARYNX:no exudate, no erythema and lips, buccal mucosa, and tongue normal  NECK: supple, thyroid normal size, non-tender, without  nodularity LYMPH:  no palpable lymphadenopathy in the cervical, axillary or inguinal LUNGS: clear to auscultation and percussion with normal breathing effort HEART: regular rate & rhythm and no murmurs and no lower extremity edema ABDOMEN:abdomen soft, non-tender and normal bowel sounds Musculoskeletal:no cyanosis of digits and no clubbing  PSYCH: alert & oriented x 3 with fluent speech NEURO: no focal motor/sensory deficits  LABORATORY DATA:  I have reviewed the data as listed CBC Latest Ref Rng & Units 11/05/2017 10/26/2017 10/19/2017  WBC 3.9 - 10.3 K/uL 8.0 2.7(L) 7.8  Hemoglobin 11.6 - 15.9 g/dL 9.3(L) 8.8(L) 9.8(L)  Hematocrit 34.8 - 46.6 % 28.7(L) 27.0(L) 29.9(L)  Platelets 145 - 400 K/uL 260 190 139(L)   CMP Latest Ref Rng & Units 11/05/2017 10/26/2017 10/19/2017  Glucose 70 - 140 mg/dL 75 126 77  BUN 7 - 26 mg/dL _0 Creatinine 0.60 - 1.10 mg/dL 0.80 0.84 0.82  Sodium 136 - 145 mmol/L 140 141 139  Potassium 3.5 - 5.1 mmol/L 4.0 3.7 3.9  Chloride 98 - 109 mmol/L 111(H) 111(H) 109  CO2 22 - 29 mmol/L 23 22 21(L)  Calcium 8.4 -  10.4 mg/dL 8.9 9.0 9.2  Total Protein 6.4 - 8.3 g/dL 6.9 6.6 7.0  Total Bilirubin 0.2 - 1.2 mg/dL 0.2 0.3 0.3  Alkaline Phos 40 - 150 U/L 89 69 70  AST 5 - 34 U/L _1 ALT 0 - 55 U/L _2 CA19.9: 1   PATHOLOGY: #1 biopsy from duodenum, distal stomach, GE junction no significant pathological changes, except reactive changes in stomach #2 transverse colon, hepatic flexure, sigmoid, polyps biopsy showed tubular adenomas (3)  Liver Biopsy  Diagnosis 07/06/17 Liver, needle/core biopsy, Right lobe - METASTATIC ADENOCARCINOMA. Microscopic Comment The tumor is positive for cytokeratin 7 and negative for cytokeratin 20, CDX-2 and TTF-1. The immunoprofile is non-specific, but the differential includes a upper GI or pancreaticobiliary primary among others. Dr. Lyndon Code has reviewed the case. Dr. Michail Sermon was paged on 07/10/2017.   PROCEDURES   EGD: 05/28/2017: Impression:  -Normal esophagus- -Z line irregular, 40 cm from the incisors, biopsied. -Acute gastritis, biopsied, -Normal examined duodenum, biopsied.  Colonoscopy: March 28, 2017 Impression -Multiple polyps, one 7 mm at the hepatic flexure, one 3 mm at flexure, one 5 mm in the transverse colon, one 4 mm in the sigmoid colon, one 14 mm in the sigmoid colon, all removed -Normal mucosa in the entire examined colon -Diverticulosis in the sigmoid colon -Internal hemorrhoids   RADIOGRAPHIC STUDIES: I have personally reviewed the radiological images as listed and agreed with the findings in the report. No results found. CT CAP W Contrast 10/02/17 IMPRESSION: 1. Reduced size of the primary pancreatic neoplasm, which still continues to encase the superior mesenteric artery and occlude the superior mesenteric vein. The numerous metastatic liver lesions show some improvement on portal venous phase images, with increase in the central necrosis and slight reduction in size. However, the arterial phase images, which are present  today but were not available on the prior exam, demonstrate that there is a significant peripheral rind around each of these lesions and that the portal venous phase images underestimate total lesions size. No findings of metastatic disease to the chest or skeleton. 2. Severe and worsened cachexia. 3. Low-grade mesenteric edema and a small amount of pelvic ascites. 4. Aortic Atherosclerosis (ICD10-I70.0) and Emphysema (ICD10-J43.9). 5.  Prominent stool throughout the colon favors constipation.  CT AP with contrast, 06/21/17 IMPRESSION:  1. Indistinct low-attenuation mass in the uncinate process of the pancreas with dilatation of remainder of the pancreatic duct and some encasement of the SMA. These findings are highly indicative of pancreatic carcinoma. Recommend MRI to assess further. 2. Multiple low-attenuation lesions throughout the right and left lobes of liver most consistent with liver metastasis again MRI may be helpful. 3. Moderate change of abdominal aortic atherosclerosis. 4. Changes of centrilobular and paraseptal emphysema on CT of the chest. No lung lesion is seen.  ASSESSMENT & PLAN: 67 y.o. African-American female, with past medical history of anxiety, otherwise healthy, presented with chronic diarrhea for 4 months, poor appetite and 35lbs weight loss.  CT scan showed a pancreatic head mass and multiple liver metastasis.  1. 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 -Her tumor has MSI stable, not a candidate for immunotherapy.  I will refer her to genetics to see if she has BRCA mutations.  She does have family history of pancreatic cancer. -She has started first-line chemotherapy with gemcitabine and  Abraxane, she tolerated the first cycle week 1 and 2 very well, with minimal side effects. -Restaging CT CAP from 10/02/17 reveals good partial response, with reduced size of the primary pancreatic neoplasm, the numerous metastatic liver lesions show some improvement on portal venous phase images, with increase in the central necrosis and slight reduction in size. No findings of metastatic disease to the chest or skeleton. I discussed results with pt and her daughter and they are pleased -ANC was 0.9 on 10/26/17 and Hgb was 8.8 so C5D8 chemo was held -lab reviewed, her neutropenia has recovered, mild anemia is stable.  Adequate to continue chemotherapy. -When she returns next week for Cycle 6 day 8, she will go home with Onpro and I advised her to take Clariton 5 days following treatment -Patient is clinically doing well, lab reviewed, adequate for treatment cycle 6 day 1 gemcitabine and Abraxane today at the same dose.  She is on Abraxane 100 mg/m and gemcitabine 800 mg/m -F/u in 3 weeks   2. Diarrhea  -Probable related to her pancreatic malignancy and chemo  -she feels creon is making her diarrhea worse, so she has stopped  - She can use imodium and I order lomotil for her to fill if she needs.   3.  Anorexia and weight loss -She has been drinking boost 3 bottles a day, which has been stabilized lately -She has used marijuana in the past, still has some at home, she will try marijuana for her anorexia. -I previously advised her to ween off Ativan to once daily.  -She was prescribed Mirtazapine on 07/11/17 to help with her appetite.  -she is eating better, weight stable lately  -Her appetite has improved with Rx Marinol. She is also using marijuana at home to aid with increasing her appetite.  -Continue on Rx Marinol, will discuss increasing the dosage if needed.   4. Smoking Cessation  -She is interested in quitting smoking, we previously encouraged her. She does not want to try nicotine  products. She is asking about Chantix, has apt with PCP next week, previously recommended she discuss this with PCP.   5. Hypokalemia  -her K is 2.9 on 10/05/17, I ordered a K supplement for her to start taking.  She will take 73mq bid for 5 days then once daily. - This is likely due to diarrhea -K normalized, 3.7 on 10/26/17  6. Goal of care discussion  -We again discussed the incurable nature of her cancer, and the overall poor prognosis, especially if she does not have good response to chemotherapy or progress on chemo -The patient understands the goal of care is palliative. -I recommend DNR/DNI, she will think about it    Plan  -start cycle 6 day 1 gemcitabine and Abraxane today  -Onpro after cycle 6 day 8, Clariton QD for 5 days following -Lab in 1 week -F/u in 3 weeks before cycle 7  All questions were answered. The patient knows to call the clinic with any problems, questions or concerns.  I spent 15 minutes counseling the patient face to face. The total time spent in the appointment was 25 minutes and more than 50% was on counseling.  This document serves as a record of services personally performed by YTruitt Merle MD. It was created on her behalf by DTheresia Bough a trained medical scribe. The creation of this record is based on the scribe's personal observations and the provider's statements to them.   I have reviewed the above documentation for accuracy and completeness, and I agree with the above.    YTruitt Merle MD 11/05/17 4:42 PM

## 2017-11-05 ENCOUNTER — Inpatient Hospital Stay: Payer: Medicare Other | Admitting: Nutrition

## 2017-11-05 ENCOUNTER — Inpatient Hospital Stay: Payer: Medicare Other

## 2017-11-05 ENCOUNTER — Inpatient Hospital Stay (HOSPITAL_BASED_OUTPATIENT_CLINIC_OR_DEPARTMENT_OTHER): Payer: Medicare Other | Admitting: Hematology

## 2017-11-05 ENCOUNTER — Encounter: Payer: Self-pay | Admitting: Hematology

## 2017-11-05 VITALS — BP 107/73 | HR 68 | Temp 98.3°F | Resp 18

## 2017-11-05 DIAGNOSIS — Z5111 Encounter for antineoplastic chemotherapy: Secondary | ICD-10-CM | POA: Diagnosis not present

## 2017-11-05 DIAGNOSIS — C259 Malignant neoplasm of pancreas, unspecified: Secondary | ICD-10-CM

## 2017-11-05 DIAGNOSIS — C250 Malignant neoplasm of head of pancreas: Secondary | ICD-10-CM | POA: Diagnosis not present

## 2017-11-05 DIAGNOSIS — C787 Secondary malignant neoplasm of liver and intrahepatic bile duct: Secondary | ICD-10-CM

## 2017-11-05 DIAGNOSIS — Z7189 Other specified counseling: Secondary | ICD-10-CM

## 2017-11-05 DIAGNOSIS — R197 Diarrhea, unspecified: Secondary | ICD-10-CM | POA: Diagnosis not present

## 2017-11-05 DIAGNOSIS — D701 Agranulocytosis secondary to cancer chemotherapy: Secondary | ICD-10-CM | POA: Diagnosis not present

## 2017-11-05 DIAGNOSIS — T451X5S Adverse effect of antineoplastic and immunosuppressive drugs, sequela: Secondary | ICD-10-CM | POA: Diagnosis not present

## 2017-11-05 LAB — COMPREHENSIVE METABOLIC PANEL
ALBUMIN: 3.4 g/dL — AB (ref 3.5–5.0)
ALK PHOS: 89 U/L (ref 40–150)
ALT: 12 U/L (ref 0–55)
ANION GAP: 6 (ref 3–11)
AST: 16 U/L (ref 5–34)
BILIRUBIN TOTAL: 0.2 mg/dL (ref 0.2–1.2)
BUN: 11 mg/dL (ref 7–26)
CALCIUM: 8.9 mg/dL (ref 8.4–10.4)
CO2: 23 mmol/L (ref 22–29)
CREATININE: 0.8 mg/dL (ref 0.60–1.10)
Chloride: 111 mmol/L — ABNORMAL HIGH (ref 98–109)
GFR calc Af Amer: >60 mL/min (ref >60)
GFR calc non Af Amer: >60 mL/min (ref >60)
GLUCOSE: 75 mg/dL (ref 70–140)
Potassium: 4 mmol/L (ref 3.5–5.1)
Sodium: 140 mmol/L (ref 136–145)
TOTAL PROTEIN: 6.9 g/dL (ref 6.4–8.3)

## 2017-11-05 LAB — CBC WITH DIFFERENTIAL/PLATELET
BASOS ABS: 0 10*3/uL (ref 0.0–0.1)
BASOS PCT: 1 %
EOS ABS: 0.2 10*3/uL (ref 0.0–0.5)
Eosinophils Relative: 2 %
HCT: 28.7 % — ABNORMAL LOW (ref 34.8–46.6)
Hemoglobin: 9.3 g/dL — ABNORMAL LOW (ref 11.6–15.9)
Lymphocytes Relative: 27 %
Lymphs Abs: 2.2 10*3/uL (ref 0.9–3.3)
MCH: 32.9 pg (ref 25.1–34.0)
MCHC: 32.4 g/dL (ref 31.5–36.0)
MCV: 101.4 fL — ABNORMAL HIGH (ref 79.5–101.0)
MONO ABS: 0.7 10*3/uL (ref 0.1–0.9)
Monocytes Relative: 9 %
Neutro Abs: 4.9 10*3/uL (ref 1.5–6.5)
Neutrophils Relative %: 61 %
Platelets: 260 10*3/uL (ref 145–400)
RBC: 2.83 MIL/uL — ABNORMAL LOW (ref 3.70–5.45)
RDW: 19.2 % — AB (ref 11.2–14.5)
WBC: 8 10*3/uL (ref 3.9–10.3)

## 2017-11-05 MED ORDER — PROCHLORPERAZINE MALEATE 10 MG PO TABS
10.0000 mg | ORAL_TABLET | Freq: Once | ORAL | Status: AC
  Administered 2017-11-05: 10 mg via ORAL

## 2017-11-05 MED ORDER — SODIUM CHLORIDE 0.9% FLUSH
10.0000 mL | Freq: Once | INTRAVENOUS | Status: AC
  Administered 2017-11-05: 10 mL
  Filled 2017-11-05: qty 10

## 2017-11-05 MED ORDER — SODIUM CHLORIDE 0.9% FLUSH
10.0000 mL | INTRAVENOUS | Status: DC | PRN
  Administered 2017-11-05: 10 mL
  Filled 2017-11-05: qty 10

## 2017-11-05 MED ORDER — SODIUM CHLORIDE 0.9 % IV SOLN
800.0000 mg/m2 | Freq: Once | INTRAVENOUS | Status: AC
  Administered 2017-11-05: 1064 mg via INTRAVENOUS
  Filled 2017-11-05: qty 27.98

## 2017-11-05 MED ORDER — SODIUM CHLORIDE 0.9 % IV SOLN
Freq: Once | INTRAVENOUS | Status: AC
  Administered 2017-11-05: 13:00:00 via INTRAVENOUS

## 2017-11-05 MED ORDER — PACLITAXEL PROTEIN-BOUND CHEMO INJECTION 100 MG
100.0000 mg/m2 | Freq: Once | Status: AC
  Administered 2017-11-05: 125 mg via INTRAVENOUS
  Filled 2017-11-05: qty 25

## 2017-11-05 MED ORDER — HEPARIN SOD (PORK) LOCK FLUSH 100 UNIT/ML IV SOLN
500.0000 [IU] | Freq: Once | INTRAVENOUS | Status: AC | PRN
  Administered 2017-11-05: 500 [IU]
  Filled 2017-11-05: qty 5

## 2017-11-05 MED ORDER — PROCHLORPERAZINE MALEATE 10 MG PO TABS
ORAL_TABLET | ORAL | Status: AC
Start: 2017-11-05 — End: ?
  Filled 2017-11-05: qty 1

## 2017-11-05 NOTE — Progress Notes (Signed)
Nutrition follow-up completed with patient who receives treatment for metastatic pancreas cancer. Weight increased and documented as 90.6 pounds March 18 improved from 87.1 pounds January 25. Patient reports she feels well today. She is eating better. She denies nausea and vomiting.   Still does not take Creon. She reports her diarrhea has resolved by taking Imodium. Dietary recall reveals patient does consume a variety of protein foods.  Nutrition diagnosis: Unintended weight loss has improved.  Intervention: Patient educated to continue strategies for adequate calorie protein intake. Encouraged oral nutrition supplements as needed. Provided support and encouragement. Questions were answered teach back method used.  Monitoring evaluation goals: Patient will continue to tolerate increased calories and protein to promote weight gain.  Next visit: To be scheduled in 1 month per patient request.  **Disclaimer: This note was dictated with voice recognition software. Similar sounding words can inadvertently be transcribed and this note may contain transcription errors which may not have been corrected upon publication of note.**

## 2017-11-05 NOTE — Patient Instructions (Signed)
[facility] Discharge Instructions for Patients Receiving Chemotherapy  Today you received the following chemotherapy agents: Abraxane and Gemzar   To help prevent nausea and vomiting after your treatment, we encourage you to take your nausea medication as directed.    If you develop nausea and vomiting that is not controlled by your nausea medication, call the clinic.   BELOW ARE SYMPTOMS THAT SHOULD BE REPORTED IMMEDIATELY:  *FEVER GREATER THAN 100.5 F  *CHILLS WITH OR WITHOUT FEVER  NAUSEA AND VOMITING THAT IS NOT CONTROLLED WITH YOUR NAUSEA MEDICATION  *UNUSUAL SHORTNESS OF BREATH  *UNUSUAL BRUISING OR BLEEDING  TENDERNESS IN MOUTH AND THROAT WITH OR WITHOUT PRESENCE OF ULCERS  *URINARY PROBLEMS  *BOWEL PROBLEMS  UNUSUAL RASH Items with * indicate a potential emergency and should be followed up as soon as possible.  Feel free to call the clinic should you have any questions or concerns. The clinic phone number is [phone].  Please show the CHEMO ALERT CARD at check-in to the Emergency Department and triage nurse.   

## 2017-11-06 ENCOUNTER — Telehealth: Payer: Self-pay | Admitting: Oncology

## 2017-11-06 NOTE — Telephone Encounter (Signed)
11/06/17 10:31 AM Per 11/05/17 Sch IB message, Raford Pitcher requesting an office visit for patient on a date she is already here in 1 month.  Patient does not have her appts out x 1 month and states she will wait for her future appts and will make another request to be seen by nutrition at that time.

## 2017-11-09 ENCOUNTER — Ambulatory Visit: Payer: Medicare Other

## 2017-11-09 ENCOUNTER — Encounter: Payer: Medicare Other | Admitting: Nutrition

## 2017-11-09 ENCOUNTER — Other Ambulatory Visit: Payer: Medicare Other

## 2017-11-12 ENCOUNTER — Inpatient Hospital Stay: Payer: Medicare Other

## 2017-11-12 VITALS — BP 113/58 | HR 65 | Temp 97.7°F | Resp 16 | Wt 90.2 lb

## 2017-11-12 DIAGNOSIS — C787 Secondary malignant neoplasm of liver and intrahepatic bile duct: Secondary | ICD-10-CM

## 2017-11-12 DIAGNOSIS — R197 Diarrhea, unspecified: Secondary | ICD-10-CM | POA: Diagnosis not present

## 2017-11-12 DIAGNOSIS — C259 Malignant neoplasm of pancreas, unspecified: Secondary | ICD-10-CM

## 2017-11-12 DIAGNOSIS — C250 Malignant neoplasm of head of pancreas: Secondary | ICD-10-CM | POA: Diagnosis not present

## 2017-11-12 DIAGNOSIS — Z7189 Other specified counseling: Secondary | ICD-10-CM

## 2017-11-12 DIAGNOSIS — D701 Agranulocytosis secondary to cancer chemotherapy: Secondary | ICD-10-CM | POA: Diagnosis not present

## 2017-11-12 DIAGNOSIS — T451X5S Adverse effect of antineoplastic and immunosuppressive drugs, sequela: Secondary | ICD-10-CM | POA: Diagnosis not present

## 2017-11-12 DIAGNOSIS — Z5111 Encounter for antineoplastic chemotherapy: Secondary | ICD-10-CM | POA: Diagnosis not present

## 2017-11-12 LAB — CBC WITH DIFFERENTIAL/PLATELET
Basophils Absolute: 0 10*3/uL (ref 0.0–0.1)
Basophils Relative: 1 %
Eosinophils Absolute: 0 10*3/uL (ref 0.0–0.5)
Eosinophils Relative: 1 %
HEMATOCRIT: 26.8 % — AB (ref 34.8–46.6)
HEMOGLOBIN: 8.7 g/dL — AB (ref 11.6–15.9)
LYMPHS ABS: 2 10*3/uL (ref 0.9–3.3)
LYMPHS PCT: 59 %
MCH: 33.1 pg (ref 25.1–34.0)
MCHC: 32.5 g/dL (ref 31.5–36.0)
MCV: 101.9 fL — AB (ref 79.5–101.0)
MONO ABS: 0 10*3/uL — AB (ref 0.1–0.9)
MONOS PCT: 1 %
NEUTROS ABS: 1.3 10*3/uL — AB (ref 1.5–6.5)
NEUTROS PCT: 38 %
Platelets: 211 10*3/uL (ref 145–400)
RBC: 2.63 MIL/uL — ABNORMAL LOW (ref 3.70–5.45)
RDW: 17.8 % — AB (ref 11.2–14.5)
WBC: 3.4 10*3/uL — ABNORMAL LOW (ref 3.9–10.3)

## 2017-11-12 LAB — COMPREHENSIVE METABOLIC PANEL
ALK PHOS: 94 U/L (ref 40–150)
ALT: 19 U/L (ref 0–55)
ANION GAP: 8 (ref 3–11)
AST: 19 U/L (ref 5–34)
Albumin: 3.4 g/dL — ABNORMAL LOW (ref 3.5–5.0)
BUN: 12 mg/dL (ref 7–26)
CALCIUM: 9.5 mg/dL (ref 8.4–10.4)
CO2: 25 mmol/L (ref 22–29)
Chloride: 108 mmol/L (ref 98–109)
Creatinine, Ser: 0.83 mg/dL (ref 0.60–1.10)
GFR calc Af Amer: >60 mL/min (ref >60)
GLUCOSE: 125 mg/dL (ref 70–140)
POTASSIUM: 3.5 mmol/L (ref 3.5–5.1)
Sodium: 141 mmol/L (ref 136–145)
TOTAL PROTEIN: 6.9 g/dL (ref 6.4–8.3)

## 2017-11-12 MED ORDER — SODIUM CHLORIDE 0.9% FLUSH
10.0000 mL | INTRAVENOUS | Status: DC | PRN
  Administered 2017-11-12: 10 mL
  Filled 2017-11-12: qty 10

## 2017-11-12 MED ORDER — PEGFILGRASTIM 6 MG/0.6ML ~~LOC~~ PSKT
PREFILLED_SYRINGE | SUBCUTANEOUS | Status: AC
  Filled 2017-11-12: qty 0.6

## 2017-11-12 MED ORDER — SODIUM CHLORIDE 0.9 % IV SOLN
Freq: Once | INTRAVENOUS | Status: AC
  Administered 2017-11-12: 14:00:00 via INTRAVENOUS

## 2017-11-12 MED ORDER — PROCHLORPERAZINE MALEATE 10 MG PO TABS
ORAL_TABLET | ORAL | Status: AC
  Filled 2017-11-12: qty 1

## 2017-11-12 MED ORDER — SODIUM CHLORIDE 0.9 % IV SOLN
800.0000 mg/m2 | Freq: Once | INTRAVENOUS | Status: AC
  Administered 2017-11-12: 1064 mg via INTRAVENOUS
  Filled 2017-11-12: qty 27.98

## 2017-11-12 MED ORDER — PROCHLORPERAZINE MALEATE 10 MG PO TABS
10.0000 mg | ORAL_TABLET | Freq: Once | ORAL | Status: AC
  Administered 2017-11-12: 10 mg via ORAL

## 2017-11-12 MED ORDER — HEPARIN SOD (PORK) LOCK FLUSH 100 UNIT/ML IV SOLN
500.0000 [IU] | Freq: Once | INTRAVENOUS | Status: AC | PRN
  Administered 2017-11-12: 500 [IU]
  Filled 2017-11-12: qty 5

## 2017-11-12 MED ORDER — SODIUM CHLORIDE 0.9% FLUSH
10.0000 mL | Freq: Once | INTRAVENOUS | Status: AC
  Administered 2017-11-12: 10 mL
  Filled 2017-11-12: qty 10

## 2017-11-12 MED ORDER — PEGFILGRASTIM 6 MG/0.6ML ~~LOC~~ PSKT
6.0000 mg | PREFILLED_SYRINGE | Freq: Once | SUBCUTANEOUS | Status: AC
  Administered 2017-11-12: 6 mg via SUBCUTANEOUS

## 2017-11-12 MED ORDER — EMPTY CONTAINERS FLEXIBLE MISC
100.0000 mg/m2 | Freq: Once | Status: AC
  Administered 2017-11-12: 125 mg via INTRAVENOUS
  Filled 2017-11-12: qty 25

## 2017-11-12 NOTE — Progress Notes (Signed)
Okay to treat with ANC level of 1.3 today per Dr. Burr Medico.

## 2017-11-12 NOTE — Patient Instructions (Signed)
[facility] Discharge Instructions for Patients Receiving Chemotherapy  Today you received the following chemotherapy agents Gemzar and Abraxane  To help prevent nausea and vomiting after your treatment, we encourage you to take your nausea medication as directed.   If you develop nausea and vomiting that is not controlled by your nausea medication, call the clinic.   BELOW ARE SYMPTOMS THAT SHOULD BE REPORTED IMMEDIATELY:  *FEVER GREATER THAN 100.5 F  *CHILLS WITH OR WITHOUT FEVER  NAUSEA AND VOMITING THAT IS NOT CONTROLLED WITH YOUR NAUSEA MEDICATION  *UNUSUAL SHORTNESS OF BREATH  *UNUSUAL BRUISING OR BLEEDING  TENDERNESS IN MOUTH AND THROAT WITH OR WITHOUT PRESENCE OF ULCERS  *URINARY PROBLEMS  *BOWEL PROBLEMS  UNUSUAL RASH Items with * indicate a potential emergency and should be followed up as soon as possible.  Feel free to call the clinic should you have any questions or concerns. The clinic phone number is [phone].  Please show the CHEMO ALERT CARD at check-in to the Emergency Department and triage nurse.   

## 2017-11-19 ENCOUNTER — Encounter: Payer: Self-pay | Admitting: Genetic Counselor

## 2017-11-19 ENCOUNTER — Inpatient Hospital Stay: Payer: Medicare Other

## 2017-11-19 ENCOUNTER — Inpatient Hospital Stay: Payer: Medicare Other | Attending: Hematology | Admitting: Genetic Counselor

## 2017-11-19 ENCOUNTER — Other Ambulatory Visit: Payer: Self-pay | Admitting: Nurse Practitioner

## 2017-11-19 DIAGNOSIS — Z79899 Other long term (current) drug therapy: Secondary | ICD-10-CM | POA: Insufficient documentation

## 2017-11-19 DIAGNOSIS — C787 Secondary malignant neoplasm of liver and intrahepatic bile duct: Principal | ICD-10-CM

## 2017-11-19 DIAGNOSIS — Z1379 Encounter for other screening for genetic and chromosomal anomalies: Secondary | ICD-10-CM

## 2017-11-19 DIAGNOSIS — C259 Malignant neoplasm of pancreas, unspecified: Secondary | ICD-10-CM | POA: Insufficient documentation

## 2017-11-19 DIAGNOSIS — R630 Anorexia: Secondary | ICD-10-CM | POA: Insufficient documentation

## 2017-11-19 DIAGNOSIS — Z8042 Family history of malignant neoplasm of prostate: Secondary | ICD-10-CM | POA: Diagnosis not present

## 2017-11-19 DIAGNOSIS — F17210 Nicotine dependence, cigarettes, uncomplicated: Secondary | ICD-10-CM | POA: Insufficient documentation

## 2017-11-19 DIAGNOSIS — T451X5S Adverse effect of antineoplastic and immunosuppressive drugs, sequela: Secondary | ICD-10-CM | POA: Insufficient documentation

## 2017-11-19 DIAGNOSIS — F419 Anxiety disorder, unspecified: Secondary | ICD-10-CM | POA: Insufficient documentation

## 2017-11-19 DIAGNOSIS — R197 Diarrhea, unspecified: Secondary | ICD-10-CM | POA: Insufficient documentation

## 2017-11-19 DIAGNOSIS — Z5111 Encounter for antineoplastic chemotherapy: Secondary | ICD-10-CM | POA: Insufficient documentation

## 2017-11-19 DIAGNOSIS — J439 Emphysema, unspecified: Secondary | ICD-10-CM | POA: Insufficient documentation

## 2017-11-19 DIAGNOSIS — D701 Agranulocytosis secondary to cancer chemotherapy: Secondary | ICD-10-CM | POA: Insufficient documentation

## 2017-11-19 DIAGNOSIS — I700 Atherosclerosis of aorta: Secondary | ICD-10-CM | POA: Insufficient documentation

## 2017-11-19 DIAGNOSIS — Z800 Family history of malignant neoplasm of digestive organs: Secondary | ICD-10-CM | POA: Diagnosis not present

## 2017-11-19 DIAGNOSIS — R634 Abnormal weight loss: Secondary | ICD-10-CM | POA: Insufficient documentation

## 2017-11-19 MED ORDER — MIRTAZAPINE 15 MG PO TABS: 15.0000 mg | ORAL_TABLET | Freq: Every day | ORAL | 3 refills | Status: DC

## 2017-11-19 MED ORDER — DIPHENOXYLATE-ATROPINE 2.5-0.025 MG PO TABS: 1.0000 | ORAL_TABLET | Freq: Four times a day (QID) | ORAL | 2 refills | Status: DC | PRN

## 2017-11-19 NOTE — Progress Notes (Signed)
REFERRING PROVIDER: Truitt Merle, MD [place], [place] [address]  PRIMARY PROVIDER:  Wilford Corner, MD  PRIMARY REASON FOR VISIT:  1. Pancreatic cancer metastasized to liver ([place])   2. Family history of pancreatic cancer   3. Family history of prostate cancer   4. Family history of colon cancer      HISTORY OF PRESENT ILLNESS:   Ms. Tammy Adams, a 67 y.o. female, was seen for a [facility] cancer genetics consultation at the request of Dr. Burr Medico due to a personal and family history of cancer.  Ms. Tammy Adams presents to clinic today to discuss the possibility of a hereditary predisposition to cancer, genetic testing, and to further clarify her future cancer risks, as well as potential cancer risks for family members.   In 2018, at the age of 73, [name] was diagnosed with pancreatic cancer. This was treated with chemotherapy.    CANCER HISTORY:    Pancreatic cancer metastasized to liver (Tammy Adams)   06/21/2017 Imaging    CT CAP IMPRESSION: 1. Indistinct low-attenuation mass in the uncinate process of the pancreas with dilatation of remainder of the pancreatic duct and some encasement of the SMA. These findings are highly indicative of pancreatic carcinoma. Recommend MRI to assess further. 2. Multiple low-attenuation lesions throughout the right and left lobes of liver most consistent with liver metastasis again MRI may be helpful. 3. Moderate change of abdominal aortic atherosclerosis. 4. Changes of centrilobular and paraseptal emphysema on CT of the chest. No lung lesion is seen.       07/06/2017 Initial Biopsy    Diagnosis Liver, needle/core biopsy, Right lobe - METASTATIC ADENOCARCINOMA.      07/10/2017 Initial Diagnosis    Pancreatic cancer metastasized to liver (Tammy Adams)      07/25/2017 -  Chemotherapy    Gemcitabine and Abraxane 3 weeks on and 1 week off starting 07/25/17       10/02/2017 Imaging    CT CAP W Contrast 10/02/17 IMPRESSION: 1. Reduced size of the  primary pancreatic neoplasm, which still continues to encase the superior mesenteric artery and occlude the superior mesenteric vein. The numerous metastatic liver lesions show some improvement on portal venous phase images, with increase in the central necrosis and slight reduction in size. However, the arterial phase images, which are present today but were not available on the prior exam, demonstrate that there is a significant peripheral rind around each of these lesions and that the portal venous phase images underestimate total lesions size. No findings of metastatic disease to the chest or skeleton. 2. Severe and worsened cachexia. 3. Low-grade mesenteric edema and a small amount of pelvic ascites. 4. Aortic Atherosclerosis (ICD10-I70.0) and Emphysema (ICD10-J43.9). 5.  Prominent stool throughout the colon favors constipation.        HORMONAL RISK FACTORS:  Menarche was at age 10.  First live birth at age 66.  OCP use for approximately 0 years.  Ovaries intact: yes.  Hysterectomy: no.  Menopausal status: postmenopausal.  HRT use: 0 years. Colonoscopy: yes; normal. Mammogram within the last year: no. Number of breast biopsies: 0. Up to date with pelvic exams:  yes. Any excessive radiation exposure in the past:  no  Past Medical History:  Diagnosis Date  . Anxiety   . Family history of colon cancer   . Family history of pancreatic cancer   . Family history of prostate cancer     Past Surgical History:  Procedure Laterality Date  . IR FLUORO GUIDE PORT  INSERTION RIGHT  07/24/2017  . IR US GUIDE VASC ACCESS RIGHT  07/24/2017    Social History   Socioeconomic History  . Marital status: Married    Spouse name: Not on file  . Number of children: Not on file  . Years of education: Not on file  . Highest education level: Not on file  Occupational History  . Not on file  Social Needs  . Financial resource strain: Not on file  . Food insecurity:    Worry: Not on  file    Inability: Not on file  . Transportation needs:    Medical: Not on file    Non-medical: Not on file  Tobacco Use  . Smoking status: Current Every Day Smoker    Packs/day: 0.50    Years: 50.00    Pack years: 25.00  . Smokeless tobacco: Never Used  Substance and Sexual Activity  . Alcohol use: No    Frequency: Never  . Drug use: Yes    Types: Marijuana  . Sexual activity: Not on file  Lifestyle  . Physical activity:    Days per week: Not on file    Minutes per session: Not on file  . Stress: Not on file  Relationships  . Social connections:    Talks on phone: Not on file    Gets together: Not on file    Attends religious service: Not on file    Active member of club or organization: Not on file    Attends meetings of clubs or organizations: Not on file    Relationship status: Not on file  Other Topics Concern  . Not on file  Social History Narrative  . Not on file     FAMILY HISTORY:  We obtained a detailed, 4-generation family history.  Significant diagnoses are listed below: Family History  Problem Relation Age of Onset  . Prostate cancer Father        d. 74  . Pancreatic cancer Maternal Aunt 70  . Colon cancer Maternal Grandmother        dx in her 45s  . Prostate cancer Maternal Uncle   . Lung cancer Cousin   . Cancer Cousin        unknown type  . Cancer Cousin 41       daughter of cousin with lung cancer    The patient has one daughter who is cancer free.  She has three sisters and two brothers who are cancer free.  Her parents are both deceased.  The patient's father was diagnosed with prostate cancer.  He was an only child.  His parents both died of old age.  The patient's mother died at 19.  She has two sisters and a brother.  One sister had pancreatic cancer at 39 and the brother had prostate cancer.  The maternal grandparents are deceased.  The grandmother has colon cancer and the grandfather had a heart attack.  Ms. Tammy Adams is unaware of  previous family history of genetic testing for hereditary cancer risks. Patient's maternal ancestors are of African American descent, and paternal ancestors are of African American descent. There is no reported Ashkenazi Jewish ancestry. There is no known consanguinity.  GENETIC COUNSELING ASSESSMENT: Damyiah Moxley is a 67 y.o. female with a personal and family history of cancer which is somewhat suggestive of a hereditary cancer syndrome and predisposition to cancer. We, therefore, discussed and recommended the following at today's visit.   DISCUSSION: We discussed that about 5-10% of pancreatic  cancer is hereditary with most cases due to BRCA mutations.  Some cases can also be associated with Lynch syndrome, a colorectal cancer syndrome.  Individuals who test positive for BRCA mutations may be able to be treated with a PARP inhibitor added to their chemotherapy regime.  Those with Lynch syndrome, if they have MSI high status on their tumor may be eligible for Keytruda.  We reviewed the characteristics, features and inheritance patterns of hereditary cancer syndromes. We also discussed genetic testing, including the appropriate family members to test, the process of testing, insurance coverage and turn-around-time for results. We discussed the implications of a negative, positive and/or variant of uncertain significant result. We recommended Ms. Lafontaine pursue genetic testing for the common hereditary cancer gene panel. The Hereditary Gene Panel offered by [facility] includes sequencing and/or deletion duplication testing of the following 47 genes: APC, ATM, AXIN2, BARD1, BMPR1A, BRCA1, BRCA2, BRIP1, CDH1, CDK4, CDKN2A (p14ARF), CDKN2A (p16INK4a), CHEK2, CTNNA1, DICER1, EPCAM (Deletion/duplication testing only), GREM1 (promoter region deletion/duplication testing only), KIT, MEN1, MLH1, MSH2, MSH3, MSH6, MUTYH, NBN, NF1, NHTL1, PALB2, PDGFRA, PMS2, POLD1, POLE, PTEN, RAD50, RAD51C, RAD51D, SDHB, SDHC, SDHD, SMAD4,  SMARCA4. STK11, TP53, TSC1, TSC2, and VHL.  The following genes were evaluated for sequence changes only: SDHA and HOXB13 c.251G>A variant only.   Based on Ms. Steinhart's personal and family history of cancer, she meets medical criteria for genetic testing. Despite that she meets criteria, she may still have an out of pocket cost. We discussed that if her out of pocket cost for testing is over $100, the laboratory will call and confirm whether she wants to proceed with testing.  If the out of pocket cost of testing is less than $100 she will be billed by the genetic testing laboratory.   PLAN: After considering the risks, benefits, and limitations, Ms. Deiter  provided informed consent to pursue genetic testing and the blood sample was [facility] to [facility] for analysis of the common hereditary cancer panel. Results should be available within approximately 2-3 weeks' time, at which point they will be disclosed by telephone to Ms. Outman, as will any additional recommendations warranted by these results. Ms. Wiersma will receive a summary of her genetic counseling visit and a copy of her results once available. This information will also be available in Epic. We encouraged Ms. Najarro to remain in contact with cancer genetics annually so that we can continuously update the family history and inform her of any changes in cancer genetics and testing that may be of benefit for her family. Ms. Berhow questions were answered to her satisfaction today. Our contact information was provided should additional questions or concerns arise.  Lastly, we encouraged Ms. Dibella to remain in contact with cancer genetics annually so that we can continuously update the family history and inform her of any changes in cancer genetics and testing that may be of benefit for this family.   Ms.  Bassinger questions were answered to her satisfaction today. Our contact information was provided should additional questions or concerns arise. Thank  you for the referral and allowing [place] to share in the care of your patient.   Karen P. Florene Glen, [place], [facility] Certified Genetic Counselor [email or web address] phone: [phone]  The patient was seen for a total of 45 minutes in face-to-face genetic counseling.  This patient was discussed with Drs. Magrinat, Lindi Adie and/or Burr Medico who agrees with the above.    _______________________________________________________________________ For Office Staff:  Number of people involved in session: 1 Was an  Intern/ student involved with case: no

## 2017-11-21 DIAGNOSIS — R634 Abnormal weight loss: Secondary | ICD-10-CM | POA: Diagnosis not present

## 2017-11-21 DIAGNOSIS — C787 Secondary malignant neoplasm of liver and intrahepatic bile duct: Secondary | ICD-10-CM | POA: Diagnosis not present

## 2017-11-21 DIAGNOSIS — F418 Other specified anxiety disorders: Secondary | ICD-10-CM | POA: Diagnosis not present

## 2017-11-21 DIAGNOSIS — C259 Malignant neoplasm of pancreas, unspecified: Secondary | ICD-10-CM | POA: Diagnosis not present

## 2017-11-23 NOTE — Progress Notes (Signed)
[place]  [phone]) [phone] [phone]  Clinic Follow Up Note   Patient Care Team: Wilford Corner, MD as PCP - General (Gastroenterology) Truitt Merle, MD as Consulting Physician (Hematology)   Date of Service:  11/26/2017   CHIEF COMPLAINTS  Pancreatic cancer with liver metastasis   HISTORY OF PRESENTING ILLNESS:  Tammy Adams 67 y.o. female is here because of her abnormal CT findings which is highly suspicious for metastatic pancreatic malignancy.  She was referred by her gastroenterologist Dr. Michail Sermon.  She presents to my clinic with her husband today.  She has been having diarrhea for 4 months, she has loose/watery BM after each meals, no pain, nausea, or bloating. She has had low appetite since then, she has been eating very little, she was also recommended to avoid dairy products by her primary care physician, per her husband, she has been taking boost 3 bottles a day lately, she has lost aobut 35 lbs, but has been stable lately since she is taking boosts.   She was initially referred by her primary care physician, and subsequently referred to GI Dr. Michail Sermon.  She underwent EGD and colonoscopy, which showed benign polyps and colon otherwise negative.,  CT chest, abdomen and pelvis was obtained on June 21, 2017, which showed a indistinct low-attenuation mass in the uncinate process of the pancreas with dilatation of pancreatic duct multiple, low-attenuation lesions throughout the right and left lobe of liver most consistent with liver metastasis.  She was referred to IR for liver biopsy this Friday.  She is otherwise healthy, retired from post office, still works part-time as a Oceanographer.  She lives with her husband, who is disabled.  She overall has mild fatigue, but functions very well at home.  No other complaints.   CURRENT THERAPY: first line Gemcitabine and Abraxane 2 weeks on and 1 week off started 07/25/17   INTERVAL HISTORY    Tammy Adams is here for a follow up and chemotherapy treatment. She presents to the infusion room today by herself. She notes she is doing well. She states she tolerated last cycle very well and she did well with Onpro. She endorses a good appetite and states she has been cooking.   On review of systems, pt denies any other complaints at this time. Pertinent positives are listed and detailed within the above HPI.   MEDICAL HISTORY:  Past Medical History:  Diagnosis Date  . Anxiety   . Family history of colon cancer   . Family history of pancreatic cancer   . Family history of prostate cancer     SURGICAL HISTORY: Past Surgical History:  Procedure Laterality Date  . IR FLUORO GUIDE PORT INSERTION RIGHT  07/24/2017  . IR US GUIDE VASC ACCESS RIGHT  07/24/2017    SOCIAL HISTORY: Social History   Socioeconomic History  . Marital status: Married    Spouse name: Not on file  . Number of children: Not on file  . Years of education: Not on file  . Highest education level: Not on file  Occupational History  . Not on file  Social Needs  . Financial resource strain: Not on file  . Food insecurity:    Worry: Not on file    Inability: Not on file  . Transportation needs:    Medical: Not on file    Non-medical: Not on file  Tobacco Use  . Smoking status: Current Every Day Smoker    Packs/day: 0.50    Years: 50.00  Pack years: 25.00  . Smokeless tobacco: Never Used  Substance and Sexual Activity  . Alcohol use: No    Frequency: Never  . Drug use: Yes    Types: Marijuana  . Sexual activity: Not on file  Lifestyle  . Physical activity:    Days per week: Not on file    Minutes per session: Not on file  . Stress: Not on file  Relationships  . Social connections:    Talks on phone: Not on file    Gets together: Not on file    Attends religious service: Not on file    Active member of club or organization: Not on file    Attends meetings of clubs or organizations: Not on  file    Relationship status: Not on file  . Intimate partner violence:    Fear of current or ex partner: Not on file    Emotionally abused: Not on file    Physically abused: Not on file    Forced sexual activity: Not on file  Other Topics Concern  . Not on file  Social History Narrative  . Not on file    FAMILY HISTORY: Family History  Problem Relation Age of Onset  . Prostate cancer Father        d. 36  . Pancreatic cancer Maternal Aunt 70  . Colon cancer Maternal Grandmother        dx in her 46s  . Prostate cancer Maternal Uncle   . Lung cancer Cousin   . Cancer Cousin        unknown type  . Cancer Cousin 57       daughter of cousin with lung cancer    ALLERGIES:  has No Known Allergies.  MEDICATIONS:  Current Outpatient Medications  Medication Sig Dispense Refill  . diphenoxylate-atropine (LOMOTIL) 2.5-0.025 MG tablet Take 1 tablet by mouth 4 (four) times daily as needed for diarrhea or loose stools. 30 tablet 2  . dronabinol (MARINOL) 2.5 MG capsule Take 1 capsule (2.5 mg total) by mouth 2 (two) times daily before a meal. 60 capsule 1  . lidocaine-prilocaine (EMLA) cream Apply to affected area once 30 g 3  . LORazepam (ATIVAN) 1 MG tablet Take 1 mg 2 (two) times daily by mouth.    . mirtazapine (REMERON) 15 MG tablet Take 1 tablet (15 mg total) by mouth at bedtime. 30 tablet 3  . Multiple Vitamin (MULTIVITAMIN WITH MINERALS) TABS tablet Take 1 tablet daily by mouth. One-A-Day for Women    . ondansetron (ZOFRAN) 8 MG tablet Take 1 tablet (8 mg total) by mouth 2 (two) times daily as needed (Nausea or vomiting). (Patient not taking: Reported on 08/24/2017) 30 tablet 1  . potassium chloride SA (K-DUR,KLOR-CON) 20 MEQ tablet Take 1 tablet (20 mEq total) by mouth daily. 40 tablet 1  . prochlorperazine (COMPAZINE) 10 MG tablet Take 1 tablet (10 mg total) by mouth every 6 (six) hours as needed (Nausea or vomiting). (Patient not taking: Reported on 10/26/2017) 30 tablet 1   No  current facility-administered medications for this visit.    Facility-Administered Medications Ordered in Other Visits  Medication Dose Route Frequency Provider Last Rate Last Dose  . sodium chloride flush (NS) 0.9 % injection 10 mL  10 mL Intracatheter PRN Truitt Merle, MD   10 mL at 07/25/17 1708    REVIEW OF SYSTEMS:   Constitutional: Denies fevers, chills or abnormal night sweats, (+) good appetite Eyes: Denies blurriness of vision, double vision  or watery eyes Ears, nose, mouth, throat, and face: Denies mucositis or sore throat Respiratory: Denies cough, dyspnea or wheezes Cardiovascular: Denies palpitation, chest discomfort or lower extremity swelling Gastrointestinal:  Denies nausea, heartburn, Skin: Denies abnormal skin rashes Lymphatics: Denies new lymphadenopathy or easy bruising Neurological:Denies numbness, tingling or new weaknesses Behavioral/Psych: Mood is stable, no new changes  All other systems were reviewed with the patient and are negative.  PHYSICAL EXAMINATION:  ECOG PERFORMANCE STATUS: 1 - Symptomatic but completely ambulatory  BP: 107/57 HR: 71 RR: 18 SPO2: 100% Temp: 98.4 GENERAL:alert, no distress and comfortable SKIN: skin color, texture, turgor are normal, no rashes or significant lesions EYES: normal, conjunctiva are pink and non-injected, sclera clear OROPHARYNX:no exudate, no erythema and lips, buccal mucosa, and tongue normal  NECK: supple, thyroid normal size, non-tender, without nodularity LYMPH:  no palpable lymphadenopathy in the cervical, axillary or inguinal LUNGS: clear to auscultation and percussion with normal breathing effort HEART: regular rate & rhythm and no murmurs and no lower extremity edema ABDOMEN:abdomen soft, non-tender and normal bowel sounds Musculoskeletal:no cyanosis of digits and no clubbing  PSYCH: alert & oriented x 3 with fluent speech NEURO: no focal motor/sensory deficits  LABORATORY DATA:  I have reviewed the data as  listed CBC Latest Ref Rng & Units 11/26/2017 11/12/2017 11/05/2017  WBC 3.9 - 10.3 K/uL 21.8(H) 3.4(L) 8.0  Hemoglobin 11.6 - 15.9 g/dL 9.4(L) 8.7(L) 9.3(L)  Hematocrit 34.8 - 46.6 % 28.8(L) 26.8(L) 28.7(L)  Platelets 145 - 400 K/uL 300 211 260   CMP Latest Ref Rng & Units 11/26/2017 11/12/2017 11/05/2017  Glucose 70 - 140 mg/dL 77 125 75  BUN 7 - 26 mg/dL '9 12 11  ' Creatinine 0.60 - 1.10 mg/dL 0.79 0.83 0.80  Sodium 136 - 145 mmol/L 140 141 140  Potassium 3.5 - 5.1 mmol/L 3.6 3.5 4.0  Chloride 98 - 109 mmol/L 110(H) 108 111(H)  CO2 22 - 29 mmol/L '25 25 23  ' Calcium 8.4 - 10.4 mg/dL 8.8 9.5 8.9  Total Protein 6.4 - 8.3 g/dL 6.9 6.9 6.9  Total Bilirubin 0.2 - 1.2 mg/dL <0.2(L) <0.2(L) 0.2  Alkaline Phos 40 - 150 U/L 115 94 89  AST 5 - 34 U/L '15 19 16  ' ALT 0 - 55 U/L '12 19 12    ' CA19.9: 1   PATHOLOGY: #1 biopsy from duodenum, distal stomach, GE junction no significant pathological changes, except reactive changes in stomach #2 transverse colon, hepatic flexure, sigmoid, polyps biopsy showed tubular adenomas (3)  Liver Biopsy  Diagnosis 07/06/17 Liver, needle/core biopsy, Right lobe - METASTATIC ADENOCARCINOMA. Microscopic Comment The tumor is positive for cytokeratin 7 and negative for cytokeratin 20, CDX-2 and TTF-1. The immunoprofile is non-specific, but the differential includes a upper GI or pancreaticobiliary primary among others. Dr. Lyndon Code has reviewed the case. Dr. Michail Sermon was paged on 07/10/2017.   PROCEDURES   EGD: 05/28/2017: Impression:  -Normal esophagus- -Z line irregular, 40 cm from the incisors, biopsied. -Acute gastritis, biopsied, -Normal examined duodenum, biopsied.  Colonoscopy: March 28, 2017 Impression -Multiple polyps, one 7 mm at the hepatic flexure, one 3 mm at flexure, one 5 mm in the transverse colon, one 4 mm in the sigmoid colon, one 14 mm in the sigmoid colon, all removed -Normal mucosa in the entire examined colon -Diverticulosis in the sigmoid  colon -Internal hemorrhoids   RADIOGRAPHIC STUDIES: I have personally reviewed the radiological images as listed and agreed with the findings in the report. No results found. CT CAP W  Contrast 10/02/17 IMPRESSION: 1. Reduced size of the primary pancreatic neoplasm, which still continues to encase the superior mesenteric artery and occlude the superior mesenteric vein. The numerous metastatic liver lesions show some improvement on portal venous phase images, with increase in the central necrosis and slight reduction in size. However, the arterial phase images, which are present today but were not available on the prior exam, demonstrate that there is a significant peripheral rind around each of these lesions and that the portal venous phase images underestimate total lesions size. No findings of metastatic disease to the chest or skeleton. 2. Severe and worsened cachexia. 3. Low-grade mesenteric edema and a small amount of pelvic ascites. 4. Aortic Atherosclerosis (ICD10-I70.0) and Emphysema (ICD10-J43.9). 5.  Prominent stool throughout the colon favors constipation.  CT AP with contrast, 06/21/17 IMPRESSION: 1. Indistinct low-attenuation mass in the uncinate process of the pancreas with dilatation of remainder of the pancreatic duct and some encasement of the SMA. These findings are highly indicative of pancreatic carcinoma. Recommend MRI to assess further. 2. Multiple low-attenuation lesions throughout the right and left lobes of liver most consistent with liver metastasis again MRI may be helpful. 3. Moderate change of abdominal aortic atherosclerosis. 4. Changes of centrilobular and paraseptal emphysema on CT of the chest. No lung lesion is seen.  ASSESSMENT & PLAN: 67 y.o. African-American female, with past medical history of anxiety, otherwise healthy, presented with chronic diarrhea for 4 months, poor appetite and 35lbs weight loss.  CT scan showed a pancreatic head mass and multiple liver  metastasis.  1. 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 -Her tumor has MSI stable, not a candidate for immunotherapy.  I have referred her to genetics to see if she has BRCA mutations.  She does have family history of pancreatic cancer. -She has started first-line chemotherapy with gemcitabine and Abraxane, she tolerated the first cycle week 1 and 2 very well, with minimal side effects. -Restaging CT CAP from 10/02/17 reveals good partial response, with reduced size of the primary pancreatic neoplasm, the numerous metastatic liver lesions show some improvement on portal venous phase images, with increase in the central necrosis and slight reduction in size. No findings of metastatic disease to the chest or skeleton. I discussed results with pt and her daughter and they are pleased -she has developed significant neutropenia from chemo,  and onpro was added from cycle 6-8, tolerated very well. -Patient is clinically doing well, lab reviewed, adequate for treatment cycle 7 day 1 gemcitabine and Abraxane today at the same dose.  She is on Abraxane 100 mg/m and gemcitabine 800 mg/m -She will continue to get Onpro with Day 8 treatment.  -She is okay to postpone treatment for 1 week due to her travel plans. She will return for f/u on 5/6 for Cycle 8 Day 1  2. Diarrhea  -Probable related to her pancreatic malignancy and chemo  -she feels creon is making her diarrhea worse, so she has stopped  - She can use imodium and I order lomotil for her to fill if she needs.  -overall mild and stable  3.  Anorexia and weight loss -She has been drinking boost 3 bottles a day, which has been stabilized lately -She has used marijuana  in the past, still  has some at home, she will try marijuana for her anorexia. -I previously advised her to ween off Ativan to once daily.  -She was prescribed Mirtazapine on 07/11/17 to help with her appetite.  -she is eating better, weight stable lately  -Her appetite has improved with Rx Marinol. She is also using marijuana at home to aid with increasing her appetite.  -Continue on Rx Marinol, will discuss increasing the dosage if needed.   4. Smoking Cessation  -She is interested in quitting smoking, we previously encouraged her. She does not want to try nicotine products. She is asking about Chantix, has apt with PCP next week, previously recommended she discuss this with PCP.   5. Goal of care discussion  -We again discussed the incurable nature of her cancer, and the overall poor prognosis, especially if she does not have good response to chemotherapy or progress on chemo -The patient understands the goal of care is palliative. -I recommend DNR/DNI, she will think about it    Plan  -start cycle 7 day 1 gemcitabine and Abraxane today  -Onpro after cycle 7 day 8, [place] QD for 5 days following -Return for f/u on 5/6 for Cycle 8 Day 1 after her trip to [place]   All questions were answered. The patient knows to call the clinic with any problems, questions or concerns.  I spent 15 minutes counseling the patient face to face. The total time spent in the appointment was 20 minutes and more than 50% was on counseling.  This document serves as a record of services personally performed by Truitt Merle, MD. It was created on her behalf by Theresia Bough, a trained medical scribe. The creation of this record is based on the scribe's personal observations and the provider's statements to them.   I have reviewed the above documentation for accuracy and completeness, and I agree with the above.    Truitt Merle, MD 11/26/17

## 2017-11-26 ENCOUNTER — Inpatient Hospital Stay: Payer: Medicare Other

## 2017-11-26 ENCOUNTER — Inpatient Hospital Stay (HOSPITAL_BASED_OUTPATIENT_CLINIC_OR_DEPARTMENT_OTHER): Payer: Medicare Other | Admitting: Hematology

## 2017-11-26 ENCOUNTER — Encounter: Payer: Self-pay | Admitting: Hematology

## 2017-11-26 VITALS — BP 107/57 | HR 71 | Temp 98.4°F | Resp 18 | Wt 90.0 lb

## 2017-11-26 DIAGNOSIS — Z800 Family history of malignant neoplasm of digestive organs: Secondary | ICD-10-CM

## 2017-11-26 DIAGNOSIS — R197 Diarrhea, unspecified: Secondary | ICD-10-CM | POA: Diagnosis not present

## 2017-11-26 DIAGNOSIS — C787 Secondary malignant neoplasm of liver and intrahepatic bile duct: Principal | ICD-10-CM

## 2017-11-26 DIAGNOSIS — C259 Malignant neoplasm of pancreas, unspecified: Secondary | ICD-10-CM

## 2017-11-26 DIAGNOSIS — F419 Anxiety disorder, unspecified: Secondary | ICD-10-CM | POA: Diagnosis not present

## 2017-11-26 DIAGNOSIS — F17210 Nicotine dependence, cigarettes, uncomplicated: Secondary | ICD-10-CM | POA: Diagnosis not present

## 2017-11-26 DIAGNOSIS — R630 Anorexia: Secondary | ICD-10-CM

## 2017-11-26 DIAGNOSIS — D701 Agranulocytosis secondary to cancer chemotherapy: Secondary | ICD-10-CM | POA: Diagnosis not present

## 2017-11-26 DIAGNOSIS — T451X5S Adverse effect of antineoplastic and immunosuppressive drugs, sequela: Secondary | ICD-10-CM | POA: Diagnosis not present

## 2017-11-26 DIAGNOSIS — R634 Abnormal weight loss: Secondary | ICD-10-CM

## 2017-11-26 DIAGNOSIS — J439 Emphysema, unspecified: Secondary | ICD-10-CM | POA: Diagnosis not present

## 2017-11-26 DIAGNOSIS — Z79899 Other long term (current) drug therapy: Secondary | ICD-10-CM

## 2017-11-26 DIAGNOSIS — Z8042 Family history of malignant neoplasm of prostate: Secondary | ICD-10-CM | POA: Diagnosis not present

## 2017-11-26 DIAGNOSIS — Z5111 Encounter for antineoplastic chemotherapy: Secondary | ICD-10-CM | POA: Diagnosis not present

## 2017-11-26 DIAGNOSIS — Z7189 Other specified counseling: Secondary | ICD-10-CM

## 2017-11-26 DIAGNOSIS — I700 Atherosclerosis of aorta: Secondary | ICD-10-CM | POA: Diagnosis not present

## 2017-11-26 LAB — COMPREHENSIVE METABOLIC PANEL
ALBUMIN: 3.5 g/dL (ref 3.5–5.0)
ALT: 12 U/L (ref 0–55)
ANION GAP: 5 (ref 3–11)
AST: 15 U/L (ref 5–34)
Alkaline Phosphatase: 115 U/L (ref 40–150)
BUN: 9 mg/dL (ref 7–26)
CHLORIDE: 110 mmol/L — AB (ref 98–109)
CO2: 25 mmol/L (ref 22–29)
Calcium: 8.8 mg/dL (ref 8.4–10.4)
Creatinine, Ser: 0.79 mg/dL (ref 0.60–1.10)
GFR calc Af Amer: >60 mL/min (ref >60)
GFR calc non Af Amer: >60 mL/min (ref >60)
GLUCOSE: 77 mg/dL (ref 70–140)
POTASSIUM: 3.6 mmol/L (ref 3.5–5.1)
SODIUM: 140 mmol/L (ref 136–145)
Total Bilirubin: <0.2 mg/dL — ABNORMAL LOW (ref 0.2–1.2)
Total Protein: 6.9 g/dL (ref 6.4–8.3)

## 2017-11-26 LAB — CBC WITH DIFFERENTIAL/PLATELET
BASOS ABS: 0 10*3/uL (ref 0.0–0.1)
BASOS PCT: 0 %
EOS ABS: 0.1 10*3/uL (ref 0.0–0.5)
Eosinophils Relative: 1 %
HEMATOCRIT: 28.8 % — AB (ref 34.8–46.6)
HEMOGLOBIN: 9.4 g/dL — AB (ref 11.6–15.9)
Lymphocytes Relative: 13 %
Lymphs Abs: 3 10*3/uL (ref 0.9–3.3)
MCH: 34.3 pg — ABNORMAL HIGH (ref 25.1–34.0)
MCHC: 32.6 g/dL (ref 31.5–36.0)
MCV: 105.1 fL — ABNORMAL HIGH (ref 79.5–101.0)
Monocytes Absolute: 1.4 10*3/uL — ABNORMAL HIGH (ref 0.1–0.9)
Monocytes Relative: 6 %
NEUTROS ABS: 17.4 10*3/uL — AB (ref 1.5–6.5)
Neutrophils Relative %: 80 %
Platelets: 300 10*3/uL (ref 145–400)
RBC: 2.74 MIL/uL — ABNORMAL LOW (ref 3.70–5.45)
RDW: 19.5 % — ABNORMAL HIGH (ref 11.2–14.5)
WBC: 21.8 10*3/uL — AB (ref 3.9–10.3)

## 2017-11-26 MED ORDER — HEPARIN SOD (PORK) LOCK FLUSH 100 UNIT/ML IV SOLN
500.0000 [IU] | Freq: Once | INTRAVENOUS | Status: AC | PRN
  Administered 2017-11-26: 500 [IU]
  Filled 2017-11-26: qty 5

## 2017-11-26 MED ORDER — SODIUM CHLORIDE 0.9 % IV SOLN
800.0000 mg/m2 | Freq: Once | INTRAVENOUS | Status: AC
  Administered 2017-11-26: 1064 mg via INTRAVENOUS
  Filled 2017-11-26: qty 27.98

## 2017-11-26 MED ORDER — PROCHLORPERAZINE MALEATE 10 MG PO TABS
10.0000 mg | ORAL_TABLET | Freq: Once | ORAL | Status: AC
  Administered 2017-11-26: 10 mg via ORAL

## 2017-11-26 MED ORDER — PACLITAXEL PROTEIN-BOUND CHEMO INJECTION 100 MG
100.0000 mg/m2 | Freq: Once | INTRAVENOUS | Status: AC
  Administered 2017-11-26: 125 mg via INTRAVENOUS
  Filled 2017-11-26: qty 25

## 2017-11-26 MED ORDER — SODIUM CHLORIDE 0.9 % IV SOLN
Freq: Once | INTRAVENOUS | Status: AC
  Administered 2017-11-26: 15:00:00 via INTRAVENOUS

## 2017-11-26 MED ORDER — PROCHLORPERAZINE MALEATE 10 MG PO TABS
ORAL_TABLET | ORAL | Status: AC
  Filled 2017-11-26: qty 1

## 2017-11-26 MED ORDER — SODIUM CHLORIDE 0.9% FLUSH
10.0000 mL | INTRAVENOUS | Status: DC | PRN
  Administered 2017-11-26: 10 mL
  Filled 2017-11-26: qty 10

## 2017-11-26 MED ORDER — SODIUM CHLORIDE 0.9% FLUSH
10.0000 mL | Freq: Once | INTRAVENOUS | Status: AC
  Administered 2017-11-26: 10 mL
  Filled 2017-11-26: qty 10

## 2017-11-26 NOTE — Patient Instructions (Signed)
[facility] Discharge Instructions for Patients Receiving Chemotherapy  Today you received the following chemotherapy agents: Abraxane and Gemzar   To help prevent nausea and vomiting after your treatment, we encourage you to take your nausea medication as directed.    If you develop nausea and vomiting that is not controlled by your nausea medication, call the clinic.   BELOW ARE SYMPTOMS THAT SHOULD BE REPORTED IMMEDIATELY:  *FEVER GREATER THAN 100.5 F  *CHILLS WITH OR WITHOUT FEVER  NAUSEA AND VOMITING THAT IS NOT CONTROLLED WITH YOUR NAUSEA MEDICATION  *UNUSUAL SHORTNESS OF BREATH  *UNUSUAL BRUISING OR BLEEDING  TENDERNESS IN MOUTH AND THROAT WITH OR WITHOUT PRESENCE OF ULCERS  *URINARY PROBLEMS  *BOWEL PROBLEMS  UNUSUAL RASH Items with * indicate a potential emergency and should be followed up as soon as possible.  Feel free to call the clinic should you have any questions or concerns. The clinic phone number is [phone].  Please show the CHEMO ALERT CARD at check-in to the Emergency Department and triage nurse.   

## 2017-11-26 NOTE — Progress Notes (Signed)
Met with patient in infusion room to offer encouragement and support. Patient states that she is doing well and has an increased appetite. "i'm gaining weight." No navigation needs identified.

## 2017-11-28 ENCOUNTER — Encounter: Payer: Self-pay | Admitting: Genetic Counselor

## 2017-11-28 ENCOUNTER — Ambulatory Visit: Payer: Self-pay | Admitting: Genetic Counselor

## 2017-11-28 ENCOUNTER — Telehealth: Payer: Self-pay | Admitting: Genetic Counselor

## 2017-11-28 DIAGNOSIS — Z8042 Family history of malignant neoplasm of prostate: Secondary | ICD-10-CM

## 2017-11-28 DIAGNOSIS — C787 Secondary malignant neoplasm of liver and intrahepatic bile duct: Secondary | ICD-10-CM

## 2017-11-28 DIAGNOSIS — Z800 Family history of malignant neoplasm of digestive organs: Secondary | ICD-10-CM

## 2017-11-28 DIAGNOSIS — Z1379 Encounter for other screening for genetic and chromosomal anomalies: Secondary | ICD-10-CM | POA: Insufficient documentation

## 2017-11-28 DIAGNOSIS — C259 Malignant neoplasm of pancreas, unspecified: Secondary | ICD-10-CM

## 2017-11-28 NOTE — Telephone Encounter (Signed)
Revealed negative genetic testing.  Discussed that we do not know why she has breast cancer or why there is cancer in the family. It could be due to a different gene that we are not testing, or maybe our current technology may not be able to pick something up.  It will be important for her to keep in contact with genetics to keep up with whether additional testing may be needed.  Revealed two vus found on the report.  This is still a normal result.

## 2017-11-28 NOTE — Progress Notes (Signed)
HPI:  Ms. Fedder was previously seen in the [facility] due to a personal and family history of cancer and concerns regarding a hereditary predisposition to cancer. Please refer to our prior cancer genetics clinic note for more information regarding Ms. Schum's medical, social and family histories, and our assessment and recommendations, at the time. Ms. Risk recent genetic test results were disclosed to her, as were recommendations warranted by these results. These results and recommendations are discussed in more detail below.  CANCER HISTORY:    Pancreatic cancer metastasized to liver ([place])   06/21/2017 Imaging    CT CAP IMPRESSION: 1. Indistinct low-attenuation mass in the uncinate process of the pancreas with dilatation of remainder of the pancreatic duct and some encasement of the SMA. These findings are highly indicative of pancreatic carcinoma. Recommend MRI to assess further. 2. Multiple low-attenuation lesions throughout the right and left lobes of liver most consistent with liver metastasis again MRI may be helpful. 3. Moderate change of abdominal aortic atherosclerosis. 4. Changes of centrilobular and paraseptal emphysema on CT of the chest. No lung lesion is seen.       07/06/2017 Initial Biopsy    Diagnosis Liver, needle/core biopsy, Right lobe - METASTATIC ADENOCARCINOMA.      07/10/2017 Initial Diagnosis    Pancreatic cancer metastasized to liver ([place])      07/25/2017 -  Chemotherapy    Gemcitabine and Abraxane 3 weeks on and 1 week off starting 07/25/17       10/02/2017 Imaging    CT CAP W Contrast 10/02/17 IMPRESSION: 1. Reduced size of the primary pancreatic neoplasm, which still continues to encase the superior mesenteric artery and occlude the superior mesenteric vein. The numerous metastatic liver lesions show some improvement on portal venous phase images, with increase in the central necrosis and slight reduction in size. However, the  arterial phase images, which are present today but were not available on the prior exam, demonstrate that there is a significant peripheral rind around each of these lesions and that the portal venous phase images underestimate total lesions size. No findings of metastatic disease to the chest or skeleton. 2. Severe and worsened cachexia. 3. Low-grade mesenteric edema and a small amount of pelvic ascites. 4. Aortic Atherosclerosis (ICD10-I70.0) and Emphysema (ICD10-J43.9). 5.  Prominent stool throughout the colon favors constipation.      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  The Hereditary Gene Panel offered by [facility] includes sequencing and/or deletion duplication testing of the following 47 genes: APC, ATM, AXIN2, BARD1, BMPR1A, BRCA1, BRCA2, BRIP1, CDH1, CDK4, CDKN2A (p14ARF), CDKN2A (p16INK4a), CHEK2, CTNNA1, DICER1, EPCAM (Deletion/duplication testing only), GREM1 (promoter region deletion/duplication testing only), KIT, MEN1, MLH1, MSH2, MSH3, MSH6, MUTYH, NBN, NF1, NHTL1, PALB2, PDGFRA, PMS2, POLD1, POLE, PTEN, RAD50, RAD51C, RAD51D, SDHB, SDHC, SDHD, SMAD4, SMARCA4. STK11, TP53, TSC1, TSC2, and VHL.  The following genes were evaluated for sequence changes only: SDHA and HOXB13 c.251G>A variant only. The report date is November 27, 2017.        FAMILY HISTORY:  We obtained a detailed, 4-generation family history.  Significant diagnoses are listed below: Family History  Problem Relation Age of Onset  . Prostate cancer Father        d. 56  . Pancreatic cancer Maternal Aunt 70  . Colon cancer Maternal Grandmother        dx in her 73s  . Prostate cancer  Maternal Uncle   . Lung cancer Cousin   . Cancer Cousin        unknown type  . Cancer Cousin 59       daughter of cousin with lung cancer    The patient has one daughter who is cancer free.  She has three sisters and two  brothers who are cancer free.  Her parents are both deceased.  The patient's father was diagnosed with prostate cancer.  He was an only child.  His parents both died of old age.  The patient's mother died at 36.  She has two sisters and a brother.  One sister had pancreatic cancer at 73 and the brother had prostate cancer.  The maternal grandparents are deceased.  The grandmother has colon cancer and the grandfather had a heart attack.  Ms. Cabeza is unaware of previous family history of genetic testing for hereditary cancer risks. Patient's maternal ancestors are of African American descent, and paternal ancestors are of African American descent. There is no reported Ashkenazi Jewish ancestry. There is no known consanguinity.  GENETIC TEST RESULTS: Genetic testing reported out on November 27, 2017 through the common hereditary cancer panel found no deleterious mutations.  The Hereditary Gene Panel offered by [facility] includes sequencing and/or deletion duplication testing of the following 47 genes: APC, ATM, AXIN2, BARD1, BMPR1A, BRCA1, BRCA2, BRIP1, CDH1, CDK4, CDKN2A (p14ARF), CDKN2A (p16INK4a), CHEK2, CTNNA1, DICER1, EPCAM (Deletion/duplication testing only), GREM1 (promoter region deletion/duplication testing only), KIT, MEN1, MLH1, MSH2, MSH3, MSH6, MUTYH, NBN, NF1, NHTL1, PALB2, PDGFRA, PMS2, POLD1, POLE, PTEN, RAD50, RAD51C, RAD51D, SDHB, SDHC, SDHD, SMAD4, SMARCA4. STK11, TP53, TSC1, TSC2, and VHL.  The following genes were evaluated for sequence changes only: SDHA and HOXB13 c.251G>A variant only.  The test report has been scanned into EPIC and is located under the Molecular Pathology section of the Results Review tab.    We discussed with Ms. Prada that since the current genetic testing is not perfect, it is possible there may be a gene mutation in one of these genes that current testing cannot detect, but that chance is small.  We also discussed, that it is possible that another gene that has not  yet been discovered, or that we have not yet tested, is responsible for the cancer diagnoses in the family, and it is, therefore, important to remain in touch with cancer genetics in the future so that we can continue to offer Ms. Bollig the most up to date genetic testing.   Genetic testing did detect two Variants of Unknown Significance - one in the KIT gene called c.482G>A (p.Arg161Lys) and the other in the PALB2 gene called c.2608G>C (p.Val870Leu).  At this time, it is unknown if this variant is associated with increased cancer risk or if this is a normal finding, but most variants such as this get reclassified to being inconsequential. It should not be used to make medical management decisions. With time, we suspect the lab will determine the significance of this variant, if any. If we do learn more about it, we will try to contact Ms. Bula to discuss it further. However, it is important to stay in touch with [place] periodically and keep the address and phone number up to date.    CANCER SCREENING RECOMMENDATIONS: This result is reassuring and indicates that Ms. Jun likely does not have an increased risk for a future cancer due to a mutation in one of these genes. This normal test also suggests that Ms. Goheen's cancer was  most likely not due to an inherited predisposition associated with one of these genes.  Most cancers happen by chance and this negative test suggests that her cancer falls into this category.  We, therefore, recommended she continue to follow the cancer management and screening guidelines provided by her oncology and primary healthcare provider.   An individual's cancer risk and medical management are not determined by genetic test results alone. Overall cancer risk assessment incorporates additional factors, including personal medical history, family history, and any available genetic information that may result in a personalized plan for cancer prevention and  surveillance.  RECOMMENDATIONS FOR FAMILY MEMBERS:  Individuals in this family might be at some increased risk of developing cancer, over the general population risk, simply due to the family history of cancer.  We recommended women in this family have a yearly mammogram beginning at age 20, or 64 years younger than the earliest onset of cancer, an annual clinical breast exam, and perform monthly breast self-exams. Women in this family should also have a gynecological exam as recommended by their primary provider. All family members should have a colonoscopy by age 3.  FOLLOW-UP: Lastly, we discussed with Ms. Bodmer that cancer genetics is a rapidly advancing field and it is possible that new genetic tests will be appropriate for her and/or her family members in the future. We encouraged her to remain in contact with cancer genetics on an annual basis so we can update her personal and family histories and let her know of advances in cancer genetics that may benefit this family.   Our contact number was provided. Ms. Glab questions were answered to her satisfaction, and she knows she is welcome to call us at anytime with additional questions or concerns.   Roma Kayser, MS, [facility] Certified Genetic Counselor [email or web address]

## 2017-12-03 ENCOUNTER — Inpatient Hospital Stay: Payer: Medicare Other

## 2017-12-03 VITALS — BP 95/61 | HR 72 | Temp 98.3°F | Resp 17

## 2017-12-03 DIAGNOSIS — T451X5S Adverse effect of antineoplastic and immunosuppressive drugs, sequela: Secondary | ICD-10-CM | POA: Diagnosis not present

## 2017-12-03 DIAGNOSIS — C787 Secondary malignant neoplasm of liver and intrahepatic bile duct: Principal | ICD-10-CM

## 2017-12-03 DIAGNOSIS — C259 Malignant neoplasm of pancreas, unspecified: Secondary | ICD-10-CM

## 2017-12-03 DIAGNOSIS — Z5111 Encounter for antineoplastic chemotherapy: Secondary | ICD-10-CM | POA: Diagnosis not present

## 2017-12-03 DIAGNOSIS — D701 Agranulocytosis secondary to cancer chemotherapy: Secondary | ICD-10-CM | POA: Diagnosis not present

## 2017-12-03 DIAGNOSIS — Z7189 Other specified counseling: Secondary | ICD-10-CM

## 2017-12-03 DIAGNOSIS — R197 Diarrhea, unspecified: Secondary | ICD-10-CM | POA: Diagnosis not present

## 2017-12-03 LAB — COMPREHENSIVE METABOLIC PANEL
ALT: 17 U/L (ref 0–55)
AST: 19 U/L (ref 5–34)
Albumin: 3.4 g/dL — ABNORMAL LOW (ref 3.5–5.0)
Alkaline Phosphatase: 95 U/L (ref 40–150)
Anion gap: 5 (ref 3–11)
BUN: 9 mg/dL (ref 7–26)
CHLORIDE: 111 mmol/L — AB (ref 98–109)
CO2: 24 mmol/L (ref 22–29)
Calcium: 9 mg/dL (ref 8.4–10.4)
Creatinine, Ser: 0.77 mg/dL (ref 0.60–1.10)
GFR calc Af Amer: >60 mL/min (ref >60)
GFR calc non Af Amer: >60 mL/min (ref >60)
Glucose, Bld: 77 mg/dL (ref 70–140)
Potassium: 3.9 mmol/L (ref 3.5–5.1)
SODIUM: 140 mmol/L (ref 136–145)
Total Bilirubin: <0.2 mg/dL — ABNORMAL LOW (ref 0.2–1.2)
Total Protein: 6.9 g/dL (ref 6.4–8.3)

## 2017-12-03 LAB — CBC WITH DIFFERENTIAL/PLATELET
Basophils Absolute: 0.1 10*3/uL (ref 0.0–0.1)
Basophils Relative: 1 %
EOS ABS: 0.1 10*3/uL (ref 0.0–0.5)
EOS PCT: 1 %
HCT: 27 % — ABNORMAL LOW (ref 34.8–46.6)
Hemoglobin: 9 g/dL — ABNORMAL LOW (ref 11.6–15.9)
LYMPHS ABS: 1.9 10*3/uL (ref 0.9–3.3)
Lymphocytes Relative: 26 %
MCH: 35.2 pg — ABNORMAL HIGH (ref 25.1–34.0)
MCHC: 33.5 g/dL (ref 31.5–36.0)
MCV: 105.1 fL — ABNORMAL HIGH (ref 79.5–101.0)
MONO ABS: 0.6 10*3/uL (ref 0.1–0.9)
MONOS PCT: 8 %
Neutro Abs: 4.6 10*3/uL (ref 1.5–6.5)
Neutrophils Relative %: 64 %
PLATELETS: 271 10*3/uL (ref 145–400)
RBC: 2.57 MIL/uL — ABNORMAL LOW (ref 3.70–5.45)
RDW: 18.8 % — ABNORMAL HIGH (ref 11.2–14.5)
WBC: 7.2 10*3/uL (ref 3.9–10.3)

## 2017-12-03 MED ORDER — PEGFILGRASTIM 6 MG/0.6ML ~~LOC~~ PSKT
PREFILLED_SYRINGE | SUBCUTANEOUS | Status: AC
  Filled 2017-12-03: qty 0.6

## 2017-12-03 MED ORDER — PACLITAXEL PROTEIN-BOUND CHEMO INJECTION 100 MG
100.0000 mg/m2 | Freq: Once | INTRAVENOUS | Status: AC
Start: 2017-12-03 — End: 2017-12-03
  Administered 2017-12-03: 125 mg via INTRAVENOUS
  Filled 2017-12-03: qty 25

## 2017-12-03 MED ORDER — SODIUM CHLORIDE 0.9% FLUSH
10.0000 mL | INTRAVENOUS | Status: DC | PRN
  Administered 2017-12-03: 10 mL
  Filled 2017-12-03: qty 10

## 2017-12-03 MED ORDER — PEGFILGRASTIM 6 MG/0.6ML ~~LOC~~ PSKT
6.0000 mg | PREFILLED_SYRINGE | Freq: Once | SUBCUTANEOUS | Status: AC
  Administered 2017-12-03: 6 mg via SUBCUTANEOUS

## 2017-12-03 MED ORDER — HEPARIN SOD (PORK) LOCK FLUSH 100 UNIT/ML IV SOLN
500.0000 [IU] | Freq: Once | INTRAVENOUS | Status: AC | PRN
  Administered 2017-12-03: 500 [IU]
  Filled 2017-12-03: qty 5

## 2017-12-03 MED ORDER — PROCHLORPERAZINE MALEATE 10 MG PO TABS
10.0000 mg | ORAL_TABLET | Freq: Once | ORAL | Status: AC
  Administered 2017-12-03: 10 mg via ORAL

## 2017-12-03 MED ORDER — SODIUM CHLORIDE 0.9% FLUSH
10.0000 mL | Freq: Once | INTRAVENOUS | Status: AC
  Administered 2017-12-03: 10 mL
  Filled 2017-12-03: qty 10

## 2017-12-03 MED ORDER — GEMCITABINE HCL CHEMO INJECTION 1 GM/26.3ML
800.0000 mg/m2 | Freq: Once | INTRAVENOUS | Status: AC
  Administered 2017-12-03: 1064 mg via INTRAVENOUS
  Filled 2017-12-03: qty 27.98

## 2017-12-03 MED ORDER — PROCHLORPERAZINE MALEATE 10 MG PO TABS
ORAL_TABLET | ORAL | Status: AC
  Filled 2017-12-03: qty 1

## 2017-12-03 MED ORDER — SODIUM CHLORIDE 0.9 % IV SOLN
Freq: Once | INTRAVENOUS | Status: AC
  Administered 2017-12-03: 15:00:00 via INTRAVENOUS

## 2017-12-03 NOTE — Patient Instructions (Signed)
Remove On Pro no earlier than 10pm on 12/04/2017.  [facility] Discharge Instructions for Patients Receiving Chemotherapy  Today you received the following chemotherapy agents:  Abraxane and Gemzar.  To help prevent nausea and vomiting after your treatment, we encourage you to take your nausea medication as directed.   If you develop nausea and vomiting that is not controlled by your nausea medication, call the clinic.   BELOW ARE SYMPTOMS THAT SHOULD BE REPORTED IMMEDIATELY:  *FEVER GREATER THAN 100.5 F  *CHILLS WITH OR WITHOUT FEVER  NAUSEA AND VOMITING THAT IS NOT CONTROLLED WITH YOUR NAUSEA MEDICATION  *UNUSUAL SHORTNESS OF BREATH  *UNUSUAL BRUISING OR BLEEDING  TENDERNESS IN MOUTH AND THROAT WITH OR WITHOUT PRESENCE OF ULCERS  *URINARY PROBLEMS  *BOWEL PROBLEMS  UNUSUAL RASH Items with * indicate a potential emergency and should be followed up as soon as possible.  Feel free to call the clinic should you have any questions or concerns. The clinic phone number is [phone].  Please show the [facility] at check-in to the Emergency Department and triage nurse.

## 2017-12-23 NOTE — Progress Notes (Signed)
[facility]  [phone] [phone]  Clinic Follow up Note   Patient Care Team: Wilford Corner, MD as PCP - General (Gastroenterology) Truitt Merle, MD as Consulting Physician (Hematology) 12/24/2017  SUMMARY OF ONCOLOGIC HISTORY:   Pancreatic cancer metastasized to liver ([place])   06/21/2017 Imaging    CT CAP IMPRESSION: 1. Indistinct low-attenuation mass in the uncinate process of the pancreas with dilatation of remainder of the pancreatic duct and some encasement of the SMA. These findings are highly indicative of pancreatic carcinoma. Recommend MRI to assess further. 2. Multiple low-attenuation lesions throughout the right and left lobes of liver most consistent with liver metastasis again MRI may be helpful. 3. Moderate change of abdominal aortic atherosclerosis. 4. Changes of centrilobular and paraseptal emphysema on CT of the chest. No lung lesion is seen.       07/06/2017 Initial Biopsy    Diagnosis Liver, needle/core biopsy, Right lobe - METASTATIC ADENOCARCINOMA.      07/10/2017 Initial Diagnosis    Pancreatic cancer metastasized to liver ([place])      07/25/2017 -  Chemotherapy    Gemcitabine and Abraxane 3 weeks on and 1 week off starting 07/25/17       10/02/2017 Imaging    CT CAP W Contrast 10/02/17 IMPRESSION: 1. Reduced size of the primary pancreatic neoplasm, which still continues to encase the superior mesenteric artery and occlude the superior mesenteric vein. The numerous metastatic liver lesions show some improvement on portal venous phase images, with increase in the central necrosis and slight reduction in size. However, the arterial phase images, which are present today but were not available on the prior exam, demonstrate that there is a significant peripheral rind around each of these lesions and that the portal venous phase images underestimate total lesions size. No findings of metastatic disease to the chest  or skeleton. 2. Severe and worsened cachexia. 3. Low-grade mesenteric edema and a small amount of pelvic ascites. 4. Aortic Atherosclerosis (ICD10-I70.0) and Emphysema (ICD10-J43.9). 5.  Prominent stool throughout the colon favors constipation.      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  The Hereditary Gene Panel offered by [facility] includes sequencing and/or deletion duplication testing of the following 47 genes: APC, ATM, AXIN2, BARD1, BMPR1A, BRCA1, BRCA2, BRIP1, CDH1, CDK4, CDKN2A (p14ARF), CDKN2A (p16INK4a), CHEK2, CTNNA1, DICER1, EPCAM (Deletion/duplication testing only), GREM1 (promoter region deletion/duplication testing only), KIT, MEN1, MLH1, MSH2, MSH3, MSH6, MUTYH, NBN, NF1, NHTL1, PALB2, PDGFRA, PMS2, POLD1, POLE, PTEN, RAD50, RAD51C, RAD51D, SDHB, SDHC, SDHD, SMAD4, SMARCA4. STK11, TP53, TSC1, TSC2, and VHL.  The following genes were evaluated for sequence changes only: SDHA and HOXB13 c.251G>A variant only. The report date is November 27, 2017.      CURRENT THERAPY: first line Gemcitabine and Abraxane 2 weeks on and 1 week off started 07/25/17   INTERVAL HISTORY: Tammy Adams returns for follow up as scheduled prior to cycle 8 gemzar/abraxane. She tolerated last cycle well. She got an extra week off for trip to [place] which she enjoyed. She spent too much time outside lately and has allergy symptoms of clear rhinorrhea and cough with clear phlegm. No sore throat, fever, chills, bloody nasal drainage, chest pain, dyspnea. She has good appetite, drink 2 boost per day. Takes lomotil twice per day. Diarrhea is less frequent overall, usually occurs 2-3 hours after food, 2 episodes per day. Denies nausea or vomiting. She noticed occasional tingling  to feet at night, but does not limit sleep or function.    REVIEW OF SYSTEMS:   Constitutional: Denies fevers, chills or abnormal weight loss Eyes:  Denies blurriness of vision Ears, nose, mouth, throat, and face: Denies mucositis or sore throat (+) clear rhinorrhea  Respiratory: Denies dyspnea or wheezes (+) cough with clear phlegm  Cardiovascular: Denies palpitation, chest discomfort or lower extremity swelling Gastrointestinal:  Denies nausea, vomiting, constipation, heartburn, abdominal pain, or change in bowel habits (+) diarrhea  Skin: Denies abnormal skin rashes Lymphatics: Denies new lymphadenopathy or easy bruising Neurological:Denies numbness or new weaknesses (+) intermittent mild tingling to toes at night  Behavioral/Psych: Mood is stable, no new changes  All other systems were reviewed with the patient and are negative.  MEDICAL HISTORY:  Past Medical History:  Diagnosis Date  . Anxiety   . Family history of colon cancer   . Family history of pancreatic cancer   . Family history of prostate cancer     SURGICAL HISTORY: Past Surgical History:  Procedure Laterality Date  . IR FLUORO GUIDE PORT INSERTION RIGHT  07/24/2017  . IR US GUIDE VASC ACCESS RIGHT  07/24/2017    I have reviewed the social history and family history with the patient and they are unchanged from previous note.  ALLERGIES:  has No Known Allergies.  MEDICATIONS:  Current Outpatient Medications  Medication Sig Dispense Refill  . diphenoxylate-atropine (LOMOTIL) 2.5-0.025 MG tablet Take 1 tablet by mouth 4 (four) times daily as needed for diarrhea or loose stools. 30 tablet 2  . lidocaine-prilocaine (EMLA) cream Apply to affected area once 30 g 3  . LORazepam (ATIVAN) 1 MG tablet Take 1 mg 2 (two) times daily by mouth.    . mirtazapine (REMERON) 15 MG tablet Take 1 tablet (15 mg total) by mouth at bedtime. 30 tablet 3  . Multiple Vitamin (MULTIVITAMIN WITH MINERALS) TABS tablet Take 1 tablet daily by mouth. One-A-Day for Women    . dronabinol (MARINOL) 2.5 MG capsule Take 1 capsule (2.5 mg total) by mouth 2 (two) times daily before a meal. (Patient  not taking: Reported on 12/24/2017) 60 capsule 1  . ondansetron (ZOFRAN) 8 MG tablet Take 1 tablet (8 mg total) by mouth 2 (two) times daily as needed (Nausea or vomiting). (Patient not taking: Reported on 08/24/2017) 30 tablet 1  . potassium chloride SA (K-DUR,KLOR-CON) 20 MEQ tablet Take 1 tablet (20 mEq total) by mouth daily. (Patient not taking: Reported on 12/24/2017) 40 tablet 1  . prochlorperazine (COMPAZINE) 10 MG tablet Take 1 tablet (10 mg total) by mouth every 6 (six) hours as needed (Nausea or vomiting). (Patient not taking: Reported on 10/26/2017) 30 tablet 1   No current facility-administered medications for this visit.    Facility-Administered Medications Ordered in Other Visits  Medication Dose Route Frequency Provider Last Rate Last Dose  . sodium chloride flush (NS) 0.9 % injection 10 mL  10 mL Intracatheter PRN Truitt Merle, MD   10 mL at 07/25/17 1708    PHYSICAL EXAMINATION: ECOG PERFORMANCE STATUS: 1 - Symptomatic but completely ambulatory  Vitals:   12/24/17 1306  BP: 108/66  Pulse: 67  Resp: 18  Temp: 97.8 F (36.6 C)  SpO2: 100%   Filed Weights   12/24/17 1306  Weight: 89 lb 12.8 oz (40.7 kg)    GENERAL:alert, no distress and comfortable SKIN: skin color, texture, turgor are normal, no rashes or significant lesions EYES: normal, Conjunctiva are pink and non-injected, sclera clear OROPHARYNX:no  exudate, no erythema and lips, buccal mucosa, and tongue normal  LYMPH:  no palpable cervical or supraclavicular lymphadenopathy  LUNGS: clear to auscultation with normal breathing effort HEART: regular rate & rhythm and no murmurs and no lower extremity edema ABDOMEN:abdomen soft, non-tender and normal bowel sounds Musculoskeletal:no cyanosis of digits and no clubbing  NEURO: alert & oriented x 3 with fluent speech, no focal motor/sensory deficits PAC without erythema   LABORATORY DATA:  I have reviewed the data as listed CBC Latest Ref Rng & Units 12/24/2017 12/03/2017  11/26/2017  WBC 3.9 - 10.3 K/uL 16.6(H) 7.2 21.8(H)  Hemoglobin 11.6 - 15.9 g/dL 10.6(L) 9.0(L) 9.4(L)  Hematocrit 34.8 - 46.6 % 32.1(L) 27.0(L) 28.8(L)  Platelets 145 - 400 K/uL 308 271 300     CMP Latest Ref Rng & Units 12/24/2017 12/03/2017 11/26/2017  Glucose 70 - 140 mg/dL 90 77 77  BUN 7 - 26 mg/dL '7 9 9  ' Creatinine 0.60 - 1.10 mg/dL 0.83 0.77 0.79  Sodium 136 - 145 mmol/L 138 140 140  Potassium 3.5 - 5.1 mmol/L 3.9 3.9 3.6  Chloride 98 - 109 mmol/L 111(H) 111(H) 110(H)  CO2 22 - 29 mmol/L '24 24 25  ' Calcium 8.4 - 10.4 mg/dL 9.3 9.0 8.8  Total Protein 6.4 - 8.3 g/dL 7.3 6.9 6.9  Total Bilirubin 0.2 - 1.2 mg/dL <0.2(L) <0.2(L) <0.2(L)  Alkaline Phos 40 - 150 U/L 122 95 115  AST 5 - 34 U/L '19 19 15  ' ALT 0 - 55 U/L '12 17 12      ' RADIOGRAPHIC STUDIES: I have personally reviewed the radiological images as listed and agreed with the findings in the report. No results found.   ASSESSMENT & PLAN: 67 y.o. African-American female, with past medical history of anxiety, otherwise healthy, presented with chronic diarrhea for 4 months, poor appetite and 35lbs weight loss.  CT scan showed a pancreatic head mass and multiple liver metastasis.  1. 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 She completed cycle 7 gem/abraxane with Onpro on 12/03/17, she is tolerating treatment very well overall. She has had few treatment delays for various reasons such as count recovery and travel. Will proceed with cycle 8 day 1 today and obtain restaging CT CAP after this cycle. CBC and CMP reviewed, she has leukocytosis, likely related to neulasta. Labs adequate for treatment. Return in 1 week for cycle 8 day 8 gem/abraxane with onpro.  2. Diarrhea -Has 2-3 episodes per day, takes lomotil twice per day, I encouraged her to maximize use and take lomotil 4 times daily.   3. Anorexia and weight loss -She has a good appetite off Marinol, weight is low but stable. I  encouraged her to try to gain weight  4. Smoking cessation -She is ready to quit smoking, I encouraged her and recommend she start with nicotine products. She will try the patch  5. Goals of care discussion  -She understands her cancer is not curable, but treatable. She has tolerated treatment well so far, she is pleased.  6. 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 No functional limitations or pain. Will monitor closely.   PLAN: -Labs reviewed, proceed with cycle 8 day 1 gemcitabine/abraxane -Return in 1 week for cycle 8 day 8 chemo with Onpro -Restaging CT CAP after cycle 8, ordered today -F/u in 3 weeks    Orders Placed This Encounter  Procedures  . CT Abdomen Pelvis W Contrast  Standing Status:   Future    Standing Expiration Date:   12/24/2018    Order Specific Question:   If indicated for the ordered procedure, I authorize the administration of contrast media per Radiology protocol    Answer:   Yes    Order Specific Question:   Preferred imaging location?    Answer:   [facility]    Order Specific Question:   Is Oral Contrast requested for this exam?    Answer:   Yes, Per Radiology protocol    Order Specific Question:   Radiology Contrast Protocol - do NOT remove file path    Answer:   \\[email or web address]  . CT Chest W Contrast    Standing Status:   Future    Standing Expiration Date:   12/24/2018    Order Specific Question:   If indicated for the ordered procedure, I authorize the administration of contrast media per Radiology protocol    Answer:   Yes    Order Specific Question:   Preferred imaging location?    Answer:   [facility]    Order Specific Question:   Radiology Contrast Protocol - do NOT remove file path    Answer:   \\[email or web address]   All questions were answered. The patient knows to call the clinic with any problems,  questions or concerns. No barriers to learning was detected. I spent 20 minutes counseling the patient face to face. The total time spent in the appointment was 25 minutes and more than 50% was on counseling and review of test results     Tammy Feeling, NP 12/24/17

## 2017-12-24 ENCOUNTER — Telehealth: Payer: Self-pay | Admitting: Nurse Practitioner

## 2017-12-24 ENCOUNTER — Encounter: Payer: Self-pay | Admitting: Nurse Practitioner

## 2017-12-24 ENCOUNTER — Inpatient Hospital Stay (HOSPITAL_BASED_OUTPATIENT_CLINIC_OR_DEPARTMENT_OTHER): Payer: Medicare Other | Admitting: Nurse Practitioner

## 2017-12-24 ENCOUNTER — Inpatient Hospital Stay: Payer: Medicare Other

## 2017-12-24 ENCOUNTER — Inpatient Hospital Stay: Payer: Medicare Other | Attending: Hematology

## 2017-12-24 VITALS — BP 108/66 | HR 67 | Temp 97.8°F | Resp 18 | Ht 64.0 in | Wt 89.8 lb

## 2017-12-24 DIAGNOSIS — R630 Anorexia: Secondary | ICD-10-CM | POA: Insufficient documentation

## 2017-12-24 DIAGNOSIS — C787 Secondary malignant neoplasm of liver and intrahepatic bile duct: Secondary | ICD-10-CM

## 2017-12-24 DIAGNOSIS — J438 Other emphysema: Secondary | ICD-10-CM | POA: Diagnosis not present

## 2017-12-24 DIAGNOSIS — F419 Anxiety disorder, unspecified: Secondary | ICD-10-CM | POA: Insufficient documentation

## 2017-12-24 DIAGNOSIS — Z5111 Encounter for antineoplastic chemotherapy: Secondary | ICD-10-CM | POA: Insufficient documentation

## 2017-12-24 DIAGNOSIS — D701 Agranulocytosis secondary to cancer chemotherapy: Secondary | ICD-10-CM | POA: Diagnosis not present

## 2017-12-24 DIAGNOSIS — F17210 Nicotine dependence, cigarettes, uncomplicated: Secondary | ICD-10-CM | POA: Diagnosis not present

## 2017-12-24 DIAGNOSIS — K529 Noninfective gastroenteritis and colitis, unspecified: Secondary | ICD-10-CM | POA: Diagnosis not present

## 2017-12-24 DIAGNOSIS — D490 Neoplasm of unspecified behavior of digestive system: Secondary | ICD-10-CM | POA: Diagnosis not present

## 2017-12-24 DIAGNOSIS — G629 Polyneuropathy, unspecified: Secondary | ICD-10-CM | POA: Insufficient documentation

## 2017-12-24 DIAGNOSIS — Z7189 Other specified counseling: Secondary | ICD-10-CM

## 2017-12-24 DIAGNOSIS — Z7689 Persons encountering health services in other specified circumstances: Secondary | ICD-10-CM | POA: Diagnosis not present

## 2017-12-24 DIAGNOSIS — C259 Malignant neoplasm of pancreas, unspecified: Secondary | ICD-10-CM | POA: Insufficient documentation

## 2017-12-24 DIAGNOSIS — Z79899 Other long term (current) drug therapy: Secondary | ICD-10-CM | POA: Diagnosis not present

## 2017-12-24 LAB — CBC WITH DIFFERENTIAL/PLATELET
Basophils Absolute: 0.1 10*3/uL (ref 0.0–0.1)
Basophils Relative: 1 %
EOS ABS: 0.2 10*3/uL (ref 0.0–0.5)
Eosinophils Relative: 1 %
HEMATOCRIT: 32.1 % — AB (ref 34.8–46.6)
Hemoglobin: 10.6 g/dL — ABNORMAL LOW (ref 11.6–15.9)
LYMPHS ABS: 1.8 10*3/uL (ref 0.9–3.3)
Lymphocytes Relative: 11 %
MCH: 35.1 pg — AB (ref 25.1–34.0)
MCHC: 33 g/dL (ref 31.5–36.0)
MCV: 106.3 fL — ABNORMAL HIGH (ref 79.5–101.0)
MONO ABS: 1.3 10*3/uL — AB (ref 0.1–0.9)
MONOS PCT: 8 %
NEUTROS ABS: 13.1 10*3/uL — AB (ref 1.5–6.5)
NEUTROS PCT: 79 %
Platelets: 308 10*3/uL (ref 145–400)
RBC: 3.02 MIL/uL — ABNORMAL LOW (ref 3.70–5.45)
RDW: 18.1 % — AB (ref 11.2–14.5)
WBC: 16.6 10*3/uL — ABNORMAL HIGH (ref 3.9–10.3)

## 2017-12-24 LAB — COMPREHENSIVE METABOLIC PANEL
ALBUMIN: 3.8 g/dL (ref 3.5–5.0)
ALK PHOS: 122 U/L (ref 40–150)
ALT: 12 U/L (ref 0–55)
AST: 19 U/L (ref 5–34)
Anion gap: 3 (ref 3–11)
BUN: 7 mg/dL (ref 7–26)
CHLORIDE: 111 mmol/L — AB (ref 98–109)
CO2: 24 mmol/L (ref 22–29)
CREATININE: 0.83 mg/dL (ref 0.60–1.10)
Calcium: 9.3 mg/dL (ref 8.4–10.4)
GFR calc non Af Amer: >60 mL/min (ref >60)
GLUCOSE: 90 mg/dL (ref 70–140)
Potassium: 3.9 mmol/L (ref 3.5–5.1)
SODIUM: 138 mmol/L (ref 136–145)
Total Bilirubin: <0.2 mg/dL — ABNORMAL LOW (ref 0.2–1.2)
Total Protein: 7.3 g/dL (ref 6.4–8.3)

## 2017-12-24 MED ORDER — PROCHLORPERAZINE MALEATE 10 MG PO TABS
10.0000 mg | ORAL_TABLET | Freq: Once | ORAL | Status: AC
  Administered 2017-12-24: 10 mg via ORAL

## 2017-12-24 MED ORDER — SODIUM CHLORIDE 0.9% FLUSH
10.0000 mL | INTRAVENOUS | Status: DC | PRN
  Administered 2017-12-24: 10 mL
  Filled 2017-12-24: qty 10

## 2017-12-24 MED ORDER — HEPARIN SOD (PORK) LOCK FLUSH 100 UNIT/ML IV SOLN
500.0000 [IU] | Freq: Once | INTRAVENOUS | Status: AC | PRN
  Administered 2017-12-24: 500 [IU]
  Filled 2017-12-24: qty 5

## 2017-12-24 MED ORDER — SODIUM CHLORIDE 0.9% FLUSH
10.0000 mL | Freq: Once | INTRAVENOUS | Status: AC
  Administered 2017-12-24: 10 mL
  Filled 2017-12-24: qty 10

## 2017-12-24 MED ORDER — PACLITAXEL PROTEIN-BOUND CHEMO INJECTION 100 MG
100.0000 mg/m2 | Freq: Once | Status: AC
  Administered 2017-12-24: 125 mg via INTRAVENOUS
  Filled 2017-12-24: qty 25

## 2017-12-24 MED ORDER — PROCHLORPERAZINE MALEATE 10 MG PO TABS
ORAL_TABLET | ORAL | Status: AC
Start: 2017-12-24 — End: ?
  Filled 2017-12-24: qty 1

## 2017-12-24 MED ORDER — SODIUM CHLORIDE 0.9 % IV SOLN
Freq: Once | INTRAVENOUS | Status: AC
  Administered 2017-12-24: 14:00:00 via INTRAVENOUS

## 2017-12-24 MED ORDER — SODIUM CHLORIDE 0.9 % IV SOLN
800.0000 mg/m2 | Freq: Once | INTRAVENOUS | Status: AC
  Administered 2017-12-24: 1064 mg via INTRAVENOUS
  Filled 2017-12-24: qty 27.98

## 2017-12-24 NOTE — Telephone Encounter (Signed)
Scheduled appt per 5/6 los - unable to schedule appts for 5/28 treatment due to capped day - logged - will contact patient when appt is added.

## 2017-12-24 NOTE — Patient Instructions (Signed)
Remove On Pro no earlier than 10pm on 12/04/2017.  [facility] Discharge Instructions for Patients Receiving Chemotherapy  Today you received the following chemotherapy agents:  Abraxane and Gemzar.  To help prevent nausea and vomiting after your treatment, we encourage you to take your nausea medication as directed.   If you develop nausea and vomiting that is not controlled by your nausea medication, call the clinic.   BELOW ARE SYMPTOMS THAT SHOULD BE REPORTED IMMEDIATELY:  *FEVER GREATER THAN 100.5 F  *CHILLS WITH OR WITHOUT FEVER  NAUSEA AND VOMITING THAT IS NOT CONTROLLED WITH YOUR NAUSEA MEDICATION  *UNUSUAL SHORTNESS OF BREATH  *UNUSUAL BRUISING OR BLEEDING  TENDERNESS IN MOUTH AND THROAT WITH OR WITHOUT PRESENCE OF ULCERS  *URINARY PROBLEMS  *BOWEL PROBLEMS  UNUSUAL RASH Items with * indicate a potential emergency and should be followed up as soon as possible.  Feel free to call the clinic should you have any questions or concerns. The clinic phone number is [phone].  Please show the [facility] at check-in to the Emergency Department and triage nurse.

## 2017-12-31 ENCOUNTER — Inpatient Hospital Stay: Payer: Medicare Other

## 2017-12-31 DIAGNOSIS — Z5111 Encounter for antineoplastic chemotherapy: Secondary | ICD-10-CM | POA: Diagnosis not present

## 2017-12-31 DIAGNOSIS — D701 Agranulocytosis secondary to cancer chemotherapy: Secondary | ICD-10-CM | POA: Diagnosis not present

## 2017-12-31 DIAGNOSIS — Z7189 Other specified counseling: Secondary | ICD-10-CM

## 2017-12-31 DIAGNOSIS — Z7689 Persons encountering health services in other specified circumstances: Secondary | ICD-10-CM | POA: Diagnosis not present

## 2017-12-31 DIAGNOSIS — F17210 Nicotine dependence, cigarettes, uncomplicated: Secondary | ICD-10-CM | POA: Diagnosis not present

## 2017-12-31 DIAGNOSIS — C259 Malignant neoplasm of pancreas, unspecified: Secondary | ICD-10-CM

## 2017-12-31 DIAGNOSIS — C787 Secondary malignant neoplasm of liver and intrahepatic bile duct: Secondary | ICD-10-CM

## 2017-12-31 LAB — COMPREHENSIVE METABOLIC PANEL
ALK PHOS: 116 U/L (ref 40–150)
ALT: 14 U/L (ref 0–55)
ANION GAP: 5 (ref 3–11)
AST: 21 U/L (ref 5–34)
Albumin: 3.8 g/dL (ref 3.5–5.0)
BUN: 11 mg/dL (ref 7–26)
CALCIUM: 9.1 mg/dL (ref 8.4–10.4)
CO2: 25 mmol/L (ref 22–29)
Chloride: 109 mmol/L (ref 98–109)
Creatinine, Ser: 0.77 mg/dL (ref 0.60–1.10)
Glucose, Bld: 92 mg/dL (ref 70–140)
Potassium: 4 mmol/L (ref 3.5–5.1)
SODIUM: 139 mmol/L (ref 136–145)
TOTAL PROTEIN: 7.2 g/dL (ref 6.4–8.3)
Total Bilirubin: 0.3 mg/dL (ref 0.2–1.2)

## 2017-12-31 LAB — CBC WITH DIFFERENTIAL/PLATELET
BASOS ABS: 0.1 10*3/uL (ref 0.0–0.1)
BASOS PCT: 2 %
Eosinophils Absolute: 0.1 10*3/uL (ref 0.0–0.5)
Eosinophils Relative: 2 %
HEMATOCRIT: 28.7 % — AB (ref 34.8–46.6)
HEMOGLOBIN: 9.7 g/dL — AB (ref 11.6–15.9)
Lymphocytes Relative: 41 %
Lymphs Abs: 1.6 10*3/uL (ref 0.9–3.3)
MCH: 36.1 pg — ABNORMAL HIGH (ref 25.1–34.0)
MCHC: 33.7 g/dL (ref 31.5–36.0)
MCV: 107.2 fL — ABNORMAL HIGH (ref 79.5–101.0)
MONOS PCT: 9 %
Monocytes Absolute: 0.3 10*3/uL (ref 0.1–0.9)
NEUTROS ABS: 1.7 10*3/uL (ref 1.5–6.5)
NEUTROS PCT: 46 %
Platelets: 131 10*3/uL — ABNORMAL LOW (ref 145–400)
RBC: 2.67 MIL/uL — AB (ref 3.70–5.45)
RDW: 16.7 % — ABNORMAL HIGH (ref 11.2–14.5)
WBC: 3.8 10*3/uL — AB (ref 3.9–10.3)

## 2017-12-31 MED ORDER — SODIUM CHLORIDE 0.9% FLUSH
10.0000 mL | INTRAVENOUS | Status: DC | PRN
  Administered 2017-12-31: 10 mL
  Filled 2017-12-31: qty 10

## 2017-12-31 MED ORDER — SODIUM CHLORIDE 0.9 % IV SOLN
800.0000 mg/m2 | Freq: Once | INTRAVENOUS | Status: AC
  Administered 2017-12-31: 1064 mg via INTRAVENOUS
  Filled 2017-12-31: qty 27.98

## 2017-12-31 MED ORDER — SODIUM CHLORIDE 0.9 % IV SOLN
Freq: Once | INTRAVENOUS | Status: AC
  Administered 2017-12-31: 15:00:00 via INTRAVENOUS

## 2017-12-31 MED ORDER — HEPARIN SOD (PORK) LOCK FLUSH 100 UNIT/ML IV SOLN
500.0000 [IU] | Freq: Once | INTRAVENOUS | Status: AC | PRN
  Administered 2017-12-31: 500 [IU]
  Filled 2017-12-31: qty 5

## 2017-12-31 MED ORDER — PEGFILGRASTIM 6 MG/0.6ML ~~LOC~~ PSKT
PREFILLED_SYRINGE | SUBCUTANEOUS | Status: AC
  Filled 2017-12-31: qty 0.6

## 2017-12-31 MED ORDER — PROCHLORPERAZINE MALEATE 10 MG PO TABS
10.0000 mg | ORAL_TABLET | Freq: Once | ORAL | Status: AC
  Administered 2017-12-31: 10 mg via ORAL

## 2017-12-31 MED ORDER — PACLITAXEL PROTEIN-BOUND CHEMO INJECTION 100 MG
100.0000 mg/m2 | Freq: Once | INTRAVENOUS | Status: AC
  Administered 2017-12-31: 125 mg via INTRAVENOUS
  Filled 2017-12-31: qty 25

## 2017-12-31 MED ORDER — SODIUM CHLORIDE 0.9% FLUSH
10.0000 mL | Freq: Once | INTRAVENOUS | Status: AC
  Administered 2017-12-31: 10 mL
  Filled 2017-12-31: qty 10

## 2017-12-31 MED ORDER — PROCHLORPERAZINE MALEATE 10 MG PO TABS
ORAL_TABLET | ORAL | Status: AC
  Filled 2017-12-31: qty 1

## 2017-12-31 MED ORDER — PEGFILGRASTIM 6 MG/0.6ML ~~LOC~~ PSKT
6.0000 mg | PREFILLED_SYRINGE | Freq: Once | SUBCUTANEOUS | Status: AC
  Administered 2017-12-31: 6 mg via SUBCUTANEOUS

## 2017-12-31 NOTE — Patient Instructions (Signed)
Remove On Pro no earlier than 10pm on 12/04/2017.  [facility] Discharge Instructions for Patients Receiving Chemotherapy  Today you received the following chemotherapy agents:  Abraxane and Gemzar.  To help prevent nausea and vomiting after your treatment, we encourage you to take your nausea medication as directed.   If you develop nausea and vomiting that is not controlled by your nausea medication, call the clinic.   BELOW ARE SYMPTOMS THAT SHOULD BE REPORTED IMMEDIATELY:  *FEVER GREATER THAN 100.5 F  *CHILLS WITH OR WITHOUT FEVER  NAUSEA AND VOMITING THAT IS NOT CONTROLLED WITH YOUR NAUSEA MEDICATION  *UNUSUAL SHORTNESS OF BREATH  *UNUSUAL BRUISING OR BLEEDING  TENDERNESS IN MOUTH AND THROAT WITH OR WITHOUT PRESENCE OF ULCERS  *URINARY PROBLEMS  *BOWEL PROBLEMS  UNUSUAL RASH Items with * indicate a potential emergency and should be followed up as soon as possible.  Feel free to call the clinic should you have any questions or concerns. The clinic phone number is [phone].  Please show the [facility] at check-in to the Emergency Department and triage nurse.

## 2018-01-02 ENCOUNTER — Other Ambulatory Visit: Payer: Self-pay | Admitting: Nurse Practitioner

## 2018-01-02 DIAGNOSIS — C787 Secondary malignant neoplasm of liver and intrahepatic bile duct: Principal | ICD-10-CM

## 2018-01-02 DIAGNOSIS — C259 Malignant neoplasm of pancreas, unspecified: Secondary | ICD-10-CM

## 2018-01-04 ENCOUNTER — Other Ambulatory Visit: Payer: Self-pay | Admitting: Nurse Practitioner

## 2018-01-04 DIAGNOSIS — C259 Malignant neoplasm of pancreas, unspecified: Secondary | ICD-10-CM

## 2018-01-04 DIAGNOSIS — C787 Secondary malignant neoplasm of liver and intrahepatic bile duct: Principal | ICD-10-CM

## 2018-01-07 ENCOUNTER — Other Ambulatory Visit: Payer: Self-pay

## 2018-01-07 DIAGNOSIS — C259 Malignant neoplasm of pancreas, unspecified: Secondary | ICD-10-CM

## 2018-01-07 DIAGNOSIS — C787 Secondary malignant neoplasm of liver and intrahepatic bile duct: Principal | ICD-10-CM

## 2018-01-07 MED ORDER — DIPHENOXYLATE-ATROPINE 2.5-0.025 MG PO TABS: ORAL_TABLET | ORAL | 2 refills | Status: DC

## 2018-01-08 ENCOUNTER — Ambulatory Visit (HOSPITAL_COMMUNITY)
Admission: RE | Admit: 2018-01-08 | Discharge: 2018-01-08 | Disposition: A | Discharge status: Home / Self Care | Payer: Medicare Other | Source: Ambulatory Visit | Attending: Nurse Practitioner | Admitting: Nurse Practitioner

## 2018-01-08 DIAGNOSIS — I700 Atherosclerosis of aorta: Secondary | ICD-10-CM | POA: Insufficient documentation

## 2018-01-08 DIAGNOSIS — D490 Neoplasm of unspecified behavior of digestive system: Secondary | ICD-10-CM | POA: Insufficient documentation

## 2018-01-08 DIAGNOSIS — K838 Other specified diseases of biliary tract: Secondary | ICD-10-CM | POA: Diagnosis not present

## 2018-01-08 DIAGNOSIS — R918 Other nonspecific abnormal finding of lung field: Secondary | ICD-10-CM | POA: Diagnosis not present

## 2018-01-08 DIAGNOSIS — Z5111 Encounter for antineoplastic chemotherapy: Secondary | ICD-10-CM | POA: Diagnosis not present

## 2018-01-08 DIAGNOSIS — K769 Liver disease, unspecified: Secondary | ICD-10-CM | POA: Insufficient documentation

## 2018-01-08 MED ORDER — HEPARIN SOD (PORK) LOCK FLUSH 100 UNIT/ML IV SOLN: 500.0000 [IU] | Freq: Once | INTRAVENOUS | Status: DC

## 2018-01-08 MED ORDER — HEPARIN SOD (PORK) LOCK FLUSH 100 UNIT/ML IV SOLN
INTRAVENOUS | Status: AC
  Filled 2018-01-08: qty 5

## 2018-01-08 MED ORDER — IOPAMIDOL (ISOVUE-300) INJECTION 61%
INTRAVENOUS | Status: AC
  Filled 2018-01-08: qty 100

## 2018-01-08 MED ORDER — IOPAMIDOL (ISOVUE-300) INJECTION 61%
100.0000 mL | Freq: Once | INTRAVENOUS | Status: AC | PRN
  Administered 2018-01-08: 100 mL via INTRAVENOUS

## 2018-01-10 NOTE — Progress Notes (Signed)
Tammy Adams  [phone] [phone]  Clinic Follow Up Note   Patient Care Team: Wilford Corner, MD as PCP - General (Gastroenterology) Truitt Merle, MD as Consulting Physician (Hematology)   Date of Service:  01/15/2018   CHIEF COMPLAINTS  Follow up for metastatic pancreatic cancer    Pancreatic cancer metastasized to liver Tammy Adams)   06/21/2017 Imaging    CT CAP IMPRESSION: 1. Indistinct low-attenuation mass in the uncinate process of the pancreas with dilatation of remainder of the pancreatic duct and some encasement of the SMA. These findings are highly indicative of pancreatic carcinoma. Recommend MRI to assess further. 2. Multiple low-attenuation lesions throughout the right and left lobes of liver most consistent with liver metastasis again MRI may be helpful. 3. Moderate change of abdominal aortic atherosclerosis. 4. Changes of centrilobular and paraseptal emphysema on CT of the chest. No lung lesion is seen.       07/06/2017 Initial Biopsy    Diagnosis Liver, needle/core biopsy, Right lobe - METASTATIC ADENOCARCINOMA.      07/10/2017 Initial Diagnosis    Pancreatic cancer metastasized to liver ([name])      07/25/2017 -  Chemotherapy    Gemcitabine and Abraxane 3 weeks on and 1 week off starting 07/25/17       10/02/2017 Imaging    CT CAP W Contrast 10/02/17 IMPRESSION: 1. Reduced size of the primary pancreatic neoplasm, which still continues to encase the superior mesenteric artery and occlude the superior mesenteric vein. The numerous metastatic liver lesions show some improvement on portal venous phase images, with increase in the central necrosis and slight reduction in size. However, the arterial phase images, which are present today but were not available on the prior exam, demonstrate that there is a significant peripheral rind around each of these lesions and that the portal venous phase images underestimate total  lesions size. No findings of metastatic disease to the chest or skeleton. 2. Severe and worsened cachexia. 3. Low-grade mesenteric edema and a small amount of pelvic ascites. 4. Aortic Atherosclerosis (ICD10-I70.0) and Emphysema (ICD10-J43.9). 5.  Prominent stool throughout the colon favors constipation.      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  The Hereditary Gene Panel offered by [facility] includes sequencing and/or deletion duplication testing of the following 47 genes: APC, ATM, AXIN2, BARD1, BMPR1A, BRCA1, BRCA2, BRIP1, CDH1, CDK4, CDKN2A (p14ARF), CDKN2A (p16INK4a), CHEK2, CTNNA1, DICER1, EPCAM (Deletion/duplication testing only), GREM1 (promoter region deletion/duplication testing only), KIT, MEN1, MLH1, MSH2, MSH3, MSH6, MUTYH, NBN, NF1, NHTL1, PALB2, PDGFRA, PMS2, POLD1, POLE, PTEN, RAD50, RAD51C, RAD51D, SDHB, SDHC, SDHD, SMAD4, SMARCA4. STK11, TP53, TSC1, TSC2, and VHL.  The following genes were evaluated for sequence changes only: SDHA and HOXB13 c.251G>A variant only. The report date is November 27, 2017.       01/08/2018 Imaging    CT CAP W Contrast 01/08/18  IMPRESSION: 1. Overall, there appears to be slight progression of disease, as evidenced by slight growth of the primary pancreatic lesion. While some of the previously noted hepatic lesions appear to have resolved or gotten smaller, others appear to be new or are slightly larger than the prior examination, as detailed above. 2. Several unusual appearing pulmonary nodules scattered throughout the lungs bilaterally, some of which are solid while others are more subsolid in appearance. Although these could be infectious or inflammatory in etiology, the possibility of developing metastatic disease in the lungs  should be considered, and close attention on follow-up studies is recommended. 3. Aortic atherosclerosis. 4.  Additional incidental findings, as above. Aortic Atherosclerosis (ICD10-I70.0).        HISTORY OF PRESENTING ILLNESS:  Tammy Adams 67 y.o. female is here because of her abnormal CT findings which is highly suspicious for metastatic pancreatic malignancy.  She was referred by her gastroenterologist Dr. Michail Sermon.  She presents to my clinic with her husband today.  She has been having diarrhea for 4 months, she has loose/watery BM after each meals, no pain, nausea, or bloating. She has had low appetite since then, she has been eating very little, she was also recommended to avoid dairy products by her primary care physician, per her husband, she has been taking boost 3 bottles a day lately, she has lost aobut 35 lbs, but has been stable lately since she is taking boosts.   She was initially referred by her primary care physician, and subsequently referred to GI Dr. Michail Sermon.  She underwent EGD and colonoscopy, which showed benign polyps and colon otherwise negative.,  CT chest, abdomen and pelvis was obtained on June 21, 2017, which showed a indistinct low-attenuation mass in the uncinate process of the pancreas with dilatation of pancreatic duct multiple, low-attenuation lesions throughout the right and left lobe of liver most consistent with liver metastasis.  She was referred to IR for liver biopsy this Friday.  She is otherwise healthy, retired from post office, still works part-time as a Oceanographer.  She lives with her husband, who is disabled.  She overall has mild fatigue, but functions very well at home.  No other complaints.   CURRENT THERAPY:  first line Gemcitabine and Abraxane 2 weeks on and 1 week off started 07/25/17 due to slight disease progression will change to 5-fu pump and liposomal irinotecan every 2 weeks in 3 weeks .  INTERVAL HISTORY   Tammy Adams is here for a follow up and chemotherapy treatment. She presents to the clinic today by herself. She notes she had  the onpro patch instead of neulasta. She requested a Handicap Sticker info from today.      MEDICAL HISTORY:  Past Medical History:  Diagnosis Date  . Anxiety   . Family history of colon cancer   . Family history of pancreatic cancer   . Family history of prostate cancer     SURGICAL HISTORY: Past Surgical History:  Procedure Laterality Date  . IR FLUORO GUIDE PORT INSERTION RIGHT  07/24/2017  . IR US GUIDE VASC ACCESS RIGHT  07/24/2017    SOCIAL HISTORY: Social History   Socioeconomic History  . Marital status: Married    Spouse name: Not on file  . Number of children: Not on file  . Years of education: Not on file  . Highest education level: Not on file  Occupational History  . Not on file  Social Needs  . Financial resource strain: Not on file  . Food insecurity:    Worry: Not on file    Inability: Not on file  . Transportation needs:    Medical: Not on file    Non-medical: Not on file  Tobacco Use  . Smoking status: Current Every Day Smoker    Packs/day: 0.50    Years: 50.00    Pack years: 25.00  . Smokeless tobacco: Never Used  Substance and Sexual Activity  . Alcohol use: No    Frequency: Never  . Drug use: Yes    Types: Marijuana  .  Sexual activity: Not on file  Lifestyle  . Physical activity:    Days per week: Not on file    Minutes per session: Not on file  . Stress: Not on file  Relationships  . Social connections:    Talks on phone: Not on file    Gets together: Not on file    Attends religious service: Not on file    Active member of club or organization: Not on file    Attends meetings of clubs or organizations: Not on file    Relationship status: Not on file  . Intimate partner violence:    Fear of current or ex partner: Not on file    Emotionally abused: Not on file    Physically abused: Not on file    Forced sexual activity: Not on file  Other Topics Concern  . Not on file  Social History Narrative  . Not on file    FAMILY  HISTORY: Family History  Problem Relation Age of Onset  . Prostate cancer Father        d. 91  . Pancreatic cancer Maternal Aunt 70  . Colon cancer Maternal Grandmother        dx in her 3s  . Prostate cancer Maternal Uncle   . Lung cancer Cousin   . Cancer Cousin        unknown type  . Cancer Cousin 61       daughter of cousin with lung cancer    ALLERGIES:  has No Known Allergies.  MEDICATIONS:  Current Outpatient Medications  Medication Sig Dispense Refill  . diphenoxylate-atropine (LOMOTIL) 2.5-0.025 MG tablet TAKE 1 TABLET BY MOUTH 4 TIMES DAILY AS NEEDED FOR  DIARRHEA  OR  LOOSE  STOOLS 30 tablet 2  . lidocaine-prilocaine (EMLA) cream Apply to affected area once 30 g 3  . LORazepam (ATIVAN) 1 MG tablet Take 1 mg 2 (two) times daily by mouth.    . mirtazapine (REMERON) 15 MG tablet Take 1 tablet (15 mg total) by mouth at bedtime. 30 tablet 3  . Multiple Vitamin (MULTIVITAMIN WITH MINERALS) TABS tablet Take 1 tablet daily by mouth. One-A-Day for Women    . ondansetron (ZOFRAN) 8 MG tablet Take 1 tablet (8 mg total) by mouth 2 (two) times daily as needed (Nausea or vomiting). 30 tablet 1  . potassium chloride SA (K-DUR,KLOR-CON) 20 MEQ tablet Take 1 tablet (20 mEq total) by mouth daily. 40 tablet 1  . prochlorperazine (COMPAZINE) 10 MG tablet Take 1 tablet (10 mg total) by mouth every 6 (six) hours as needed (Nausea or vomiting). 30 tablet 1   No current facility-administered medications for this visit.    Facility-Administered Medications Ordered in Other Visits  Medication Dose Route Frequency Provider Last Rate Last Dose  . sodium chloride flush (NS) 0.9 % injection 10 mL  10 mL Intracatheter PRN Truitt Merle, MD   10 mL at 07/25/17 1708  . sodium chloride flush (NS) 0.9 % injection 10 mL  10 mL Intracatheter PRN Truitt Merle, MD   10 mL at 01/15/18 1703    REVIEW OF SYSTEMS:   Constitutional: Denies fevers, chills or abnormal night sweats Eyes: Denies blurriness of vision,  double vision or watery eyes Ears, nose, mouth, throat, and face: Denies mucositis or sore throat Respiratory: Denies cough, dyspnea or wheezes Cardiovascular: Denies palpitation, chest discomfort or lower extremity swelling Gastrointestinal:  Denies nausea, heartburn, Skin: Denies abnormal skin rashes Lymphatics: Denies new lymphadenopathy or easy bruising Neurological:Denies  numbness, tingling or new weaknesses Behavioral/Psych: Mood is stable, no new changes  All other systems were reviewed with the patient and are negative.  PHYSICAL EXAMINATION:  ECOG PERFORMANCE STATUS: 1 - Symptomatic but completely ambulatory  Vitals:   01/15/18 1240  BP: 108/67  Pulse: 80  Resp: 18  Temp: 98.3 F (36.8 C)  TempSrc: Oral  SpO2: 100%  Weight: 89 lb 14.4 oz (40.8 kg)  Height: '5\' 4"'  (1.626 m)    GENERAL:alert, no distress and comfortable SKIN: skin color, texture, turgor are normal, no rashes or significant lesions EYES: normal, conjunctiva are pink and non-injected, sclera clear OROPHARYNX:no exudate, no erythema and lips, buccal mucosa, and tongue normal  NECK: supple, thyroid normal size, non-tender, without nodularity LYMPH:  no palpable lymphadenopathy in the cervical, axillary or inguinal LUNGS: clear to auscultation and percussion with normal breathing effort HEART: regular rate & rhythm and no murmurs and no lower extremity edema ABDOMEN:abdomen soft, non-tender and normal bowel sounds Musculoskeletal:no cyanosis of digits and no clubbing  PSYCH: alert & oriented x 3 with fluent speech NEURO: no focal motor/sensory deficits  LABORATORY DATA:  I have reviewed the data as listed CBC Latest Ref Rng & Units 01/15/2018 12/31/2017 12/24/2017  WBC 3.9 - 10.3 K/uL 7.0 3.8(L) 16.6(H)  Hemoglobin 11.6 - 15.9 g/dL 9.5(L) 9.7(L) 10.6(L)  Hematocrit 34.8 - 46.6 % 28.3(L) 28.7(L) 32.1(L)  Platelets 145 - 400 K/uL 281 131(L) 308   CMP Latest Ref Rng & Units 01/15/2018 12/31/2017 12/24/2017    Glucose 70 - 140 mg/dL 102 92 90  BUN 7 - 26 mg/dL '11 11 7  ' Creatinine 0.60 - 1.10 mg/dL 0.80 0.77 0.83  Sodium 136 - 145 mmol/L 140 139 138  Potassium 3.5 - 5.1 mmol/L 3.8 4.0 3.9  Chloride 98 - 109 mmol/L 111(H) 109 111(H)  CO2 22 - 29 mmol/L '22 25 24  ' Calcium 8.4 - 10.4 mg/dL 9.0 9.1 9.3  Total Protein 6.4 - 8.3 g/dL 7.1 7.2 7.3  Total Bilirubin 0.2 - 1.2 mg/dL 0.2 0.3 <0.2(L)  Alkaline Phos 40 - 150 U/L 95 116 122  AST 5 - 34 U/L '19 21 19  ' ALT 0 - 55 U/L '18 14 12    ' CA19.9: 1   PATHOLOGY: #1 biopsy from duodenum, distal stomach, GE junction no significant pathological changes, except reactive changes in stomach #2 transverse colon, hepatic flexure, sigmoid, polyps biopsy showed tubular adenomas (3)  Liver Biopsy  Diagnosis 07/06/17 Liver, needle/core biopsy, Right lobe - METASTATIC ADENOCARCINOMA. Microscopic Comment The tumor is positive for cytokeratin 7 and negative for cytokeratin 20, CDX-2 and TTF-1. The immunoprofile is non-specific, but the differential includes a upper GI or pancreaticobiliary primary among others. Dr. Lyndon Code has reviewed the case. Dr. Michail Sermon was paged on 07/10/2017.   PROCEDURES   EGD: 05/28/2017: Impression:  -Normal esophagus- -Z line irregular, 40 cm from the incisors, biopsied. -Acute gastritis, biopsied, -Normal examined duodenum, biopsied.  Colonoscopy: March 28, 2017 Impression -Multiple polyps, one 7 mm at the hepatic flexure, one 3 mm at flexure, one 5 mm in the transverse colon, one 4 mm in the sigmoid colon, one 14 mm in the sigmoid colon, all removed -Normal mucosa in the entire examined colon -Diverticulosis in the sigmoid colon -Internal hemorrhoids   RADIOGRAPHIC STUDIES: I have personally reviewed the radiological images as listed and agreed with the findings in the report. Ct Chest W Contrast  Result Date: 01/08/2018 CLINICAL DATA:  67 year old female with history of pancreatic neoplasm undergoing chemotherapy.  EXAM:  CT CHEST, ABDOMEN, AND PELVIS WITH CONTRAST TECHNIQUE: Multidetector CT imaging of the chest, abdomen and pelvis was performed following the standard protocol during bolus administration of intravenous contrast. CONTRAST:  113m ISOVUE-300 IOPAMIDOL (ISOVUE-300) INJECTION 61% COMPARISON:  CT the chest, abdomen and pelvis 10/02/2017. FINDINGS: CT CHEST FINDINGS Cardiovascular: Heart size is normal. There is no significant pericardial fluid, thickening or pericardial calcification. Aortic atherosclerosis. No coronary artery calcifications. Right IJ single-lumen porta cath with tip terminating in the right atrium. Mediastinum/Nodes: No pathologically enlarged mediastinal or hilar lymph nodes. Esophagus is unremarkable in appearance. No axillary lymphadenopathy. Lungs/Pleura: There are some new nodular appearing areas of architectural distortion in both lungs. The largest solid-appearing area is in the right lower lobe (axial image 89 of series 11) where there is an regular shaped 8 x 10 mm nodule. The largest non solid appearing nodular areas in the left lower lobe where there is a lesion has internal cystic changes and multiple internal septations measuring 11 x 17 mm (axial image 97 of series 11). No acute consolidative airspace disease. No pleural effusions. Diffuse bronchial wall thickening with moderate centrilobular and mild paraseptal emphysema. Musculoskeletal: There are no aggressive appearing lytic or blastic lesions noted in the visualized portions of the skeleton. CT ABDOMEN PELVIS FINDINGS Hepatobiliary: Again noted are several ill-defined hypovascular lesions scattered throughout the liver, most evident in the right lobe. The largest of these is very poorly defined centered predominantly in segment 7, estimated to measure approximately 3.0 x 2.6 cm on today's examination. How much of this is truly a lesion versus an associated perfusion anomaly is uncertain on today's examination. Several of the other  previously noted lesions appear smaller than the prior examination, while there are some new ill-defined hypovascular lesions suggestive of new metastatic lesions. These are best demonstrated on images 106 and 108 of series 6 where there are 2 new 9 mm lesions. Mild intrahepatic biliary ductal dilatation. Common bile duct is also dilated measuring 8 mm in the porta hepatis. Gallbladder is normal in appearance. Pancreas: Extending off the medial aspect of the head and uncinate process of the pancreas there is a poorly defined hypovascular mass which engulfs the proximal superior mesenteric artery, similar to the prior study but increased in size, currently measuring 3.5 x 3.2 cm (axial image 111 of series 6). This again exerts mass effect upon the pancreatic head resulting in both common bile duct dilatation and pancreatic ductal dilatation. Main pancreatic duct continues to enlarge, currently 9 mm in diameter (previously 7 mm). This lesion also completely occludes the superior mesenteric vein, and causes mild narrowing of the splenic vein immediately before the splenoportal confluence. Numerous venous collaterals are noted bypassing this obstruction. No peripancreatic fluid collections or inflammatory changes are noted. Spleen: Unremarkable. Adrenals/Urinary Tract: Multiple low-attenuation lesions in the right kidney, compatible with simple cysts. Other low-attenuation lesions in both kidneys are subcentimeter in size and too small to definitively characterize, but are also favored to represent tiny cysts. No hydroureteronephrosis. Urinary bladder is normal in appearance. Stomach/Bowel: Normal appearance of the stomach. No pathologic dilatation of small bowel or colon. A few colonic diverticulae are noted in the sigmoid colon, without surrounding inflammatory changes to suggest an acute diverticulitis at this time. Normal appendix. Vascular/Lymphatic: Vascular findings pertinent to pancreatic neoplasm, as discussed  above. Aortic atherosclerosis, without evidence of aneurysm or dissection in the abdominal or pelvic vasculature. No definite lymphadenopathy confidently identified in the abdomen or pelvis (assessment is limited by diffuse mesenteric edema  and cachexia). Reproductive: Uterus and ovaries are unremarkable in appearance. Other: Small volume of ascites.  No pneumoperitoneum. Musculoskeletal: There are no aggressive appearing lytic or blastic lesions noted in the visualized portions of the skeleton. IMPRESSION: 1. Overall, there appears to be slight progression of disease, as evidenced by slight growth of the primary pancreatic lesion. While some of the previously noted hepatic lesions appear to have resolved or gotten smaller, others appear to be new or are slightly larger than the prior examination, as detailed above. 2. Several unusual appearing pulmonary nodules scattered throughout the lungs bilaterally, some of which are solid while others are more subsolid in appearance. Although these could be infectious or inflammatory in etiology, the possibility of developing metastatic disease in the lungs should be considered, and close attention on follow-up studies is recommended. 3. Aortic atherosclerosis. 4. Additional incidental findings, as above. Aortic Atherosclerosis (ICD10-I70.0). Electronically Signed   By: Vinnie Langton M.D.   On: 01/08/2018 17:08   Ct Abdomen Pelvis W Contrast  Result Date: 01/08/2018 CLINICAL DATA:  67 year old female with history of pancreatic neoplasm undergoing chemotherapy. EXAM: CT CHEST, ABDOMEN, AND PELVIS WITH CONTRAST TECHNIQUE: Multidetector CT imaging of the chest, abdomen and pelvis was performed following the standard protocol during bolus administration of intravenous contrast. CONTRAST:  152m ISOVUE-300 IOPAMIDOL (ISOVUE-300) INJECTION 61% COMPARISON:  CT the chest, abdomen and pelvis 10/02/2017. FINDINGS: CT CHEST FINDINGS Cardiovascular: Heart size is normal. There is  no significant pericardial fluid, thickening or pericardial calcification. Aortic atherosclerosis. No coronary artery calcifications. Right IJ single-lumen porta cath with tip terminating in the right atrium. Mediastinum/Nodes: No pathologically enlarged mediastinal or hilar lymph nodes. Esophagus is unremarkable in appearance. No axillary lymphadenopathy. Lungs/Pleura: There are some new nodular appearing areas of architectural distortion in both lungs. The largest solid-appearing area is in the right lower lobe (axial image 89 of series 11) where there is an regular shaped 8 x 10 mm nodule. The largest non solid appearing nodular areas in the left lower lobe where there is a lesion has internal cystic changes and multiple internal septations measuring 11 x 17 mm (axial image 97 of series 11). No acute consolidative airspace disease. No pleural effusions. Diffuse bronchial wall thickening with moderate centrilobular and mild paraseptal emphysema. Musculoskeletal: There are no aggressive appearing lytic or blastic lesions noted in the visualized portions of the skeleton. CT ABDOMEN PELVIS FINDINGS Hepatobiliary: Again noted are several ill-defined hypovascular lesions scattered throughout the liver, most evident in the right lobe. The largest of these is very poorly defined centered predominantly in segment 7, estimated to measure approximately 3.0 x 2.6 cm on today's examination. How much of this is truly a lesion versus an associated perfusion anomaly is uncertain on today's examination. Several of the other previously noted lesions appear smaller than the prior examination, while there are some new ill-defined hypovascular lesions suggestive of new metastatic lesions. These are best demonstrated on images 106 and 108 of series 6 where there are 2 new 9 mm lesions. Mild intrahepatic biliary ductal dilatation. Common bile duct is also dilated measuring 8 mm in the porta hepatis. Gallbladder is normal in appearance.  Pancreas: Extending off the medial aspect of the head and uncinate process of the pancreas there is a poorly defined hypovascular mass which engulfs the proximal superior mesenteric artery, similar to the prior study but increased in size, currently measuring 3.5 x 3.2 cm (axial image 111 of series 6). This again exerts mass effect upon the pancreatic head resulting in  both common bile duct dilatation and pancreatic ductal dilatation. Main pancreatic duct continues to enlarge, currently 9 mm in diameter (previously 7 mm). This lesion also completely occludes the superior mesenteric vein, and causes mild narrowing of the splenic vein immediately before the splenoportal confluence. Numerous venous collaterals are noted bypassing this obstruction. No peripancreatic fluid collections or inflammatory changes are noted. Spleen: Unremarkable. Adrenals/Urinary Tract: Multiple low-attenuation lesions in the right kidney, compatible with simple cysts. Other low-attenuation lesions in both kidneys are subcentimeter in size and too small to definitively characterize, but are also favored to represent tiny cysts. No hydroureteronephrosis. Urinary bladder is normal in appearance. Stomach/Bowel: Normal appearance of the stomach. No pathologic dilatation of small bowel or colon. A few colonic diverticulae are noted in the sigmoid colon, without surrounding inflammatory changes to suggest an acute diverticulitis at this time. Normal appendix. Vascular/Lymphatic: Vascular findings pertinent to pancreatic neoplasm, as discussed above. Aortic atherosclerosis, without evidence of aneurysm or dissection in the abdominal or pelvic vasculature. No definite lymphadenopathy confidently identified in the abdomen or pelvis (assessment is limited by diffuse mesenteric edema and cachexia). Reproductive: Uterus and ovaries are unremarkable in appearance. Other: Small volume of ascites.  No pneumoperitoneum. Musculoskeletal: There are no  aggressive appearing lytic or blastic lesions noted in the visualized portions of the skeleton. IMPRESSION: 1. Overall, there appears to be slight progression of disease, as evidenced by slight growth of the primary pancreatic lesion. While some of the previously noted hepatic lesions appear to have resolved or gotten smaller, others appear to be new or are slightly larger than the prior examination, as detailed above. 2. Several unusual appearing pulmonary nodules scattered throughout the lungs bilaterally, some of which are solid while others are more subsolid in appearance. Although these could be infectious or inflammatory in etiology, the possibility of developing metastatic disease in the lungs should be considered, and close attention on follow-up studies is recommended. 3. Aortic atherosclerosis. 4. Additional incidental findings, as above. Aortic Atherosclerosis (ICD10-I70.0). Electronically Signed   By: Vinnie Langton M.D.   On: 01/08/2018 17:08   CT CAP W Contrast 10/02/17 IMPRESSION: 1. Reduced size of the primary pancreatic neoplasm, which still continues to encase the superior mesenteric artery and occlude the superior mesenteric vein. The numerous metastatic liver lesions show some improvement on portal venous phase images, with increase in the central necrosis and slight reduction in size. However, the arterial phase images, which are present today but were not available on the prior exam, demonstrate that there is a significant peripheral rind around each of these lesions and that the portal venous phase images underestimate total lesions size. No findings of metastatic disease to the chest or skeleton. 2. Severe and worsened cachexia. 3. Low-grade mesenteric edema and a small amount of pelvic ascites. 4. Aortic Atherosclerosis (ICD10-I70.0) and Emphysema (ICD10-J43.9). 5.  Prominent stool throughout the colon favors constipation.  CT AP with contrast, 06/21/17 IMPRESSION: 1. Indistinct  low-attenuation mass in the uncinate process of the pancreas with dilatation of remainder of the pancreatic duct and some encasement of the SMA. These findings are highly indicative of pancreatic carcinoma. Recommend MRI to assess further. 2. Multiple low-attenuation lesions throughout the right and left lobes of liver most consistent with liver metastasis again MRI may be helpful. 3. Moderate change of abdominal aortic atherosclerosis. 4. Changes of centrilobular and paraseptal emphysema on CT of the chest. No lung lesion is seen.  ASSESSMENT & PLAN: 67 y.o. African-American female, with past medical history of anxiety, otherwise  healthy, presented with chronic diarrhea for 4 months, poor appetite and 35lbs weight loss.  CT scan showed a pancreatic head mass and multiple liver metastasis.  1. 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 -Her tumor has MSI stable, not a candidate for immunotherapy.   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  Unfortunately she is not a candidate for PARP inhibitor  -She has started first-line chemotherapy with gemcitabine and Abraxane, she tolerated the first cycle week 1 and 2 very well, with minimal side effects. -Restaging CT CAP from 10/02/17 reveals good partial response, with reduced size of the primary pancreatic neoplasm, the numerous metastatic liver lesions show some improvement on portal venous phase images, with increase in the central necrosis and slight reduction in size. No findings of metastatic disease to the chest or skeleton. I discussed results with pt and her daughter and they are  pleased -she has developed significant neutropenia from chemo, and onpro was added from cycle 6-8, tolerated very well. -We discussed her CT CAP from 01/08/18 which shows mixed response, but overall slight disease progression with slight growth in pancreatic lesion while some liver lesions have resolved or reduced in size along with evidence of new small liver lesions. There are a few lung nodules which are concerning for metastasis there.  I reviewed her image with patient in person. -I discussed her current chemo regimen is no longer controlling her disease. These are more lines of treatment available to control her disease. I do not think she will be able to tolerate the more aggressive chemo such as FOLFIRINOX due to her nutrition status and comorbilities. So I recommend second line moderate chemo 5-fu pump and liposomal irinotecan every 2 weeks. If she tolerates very well, I may try FOLFIRINOX also. She is agreeable. I will switch her treatment after this current cycle 9.  -F/u in 3 weeks   2. Diarrhea  -Probable related to her pancreatic malignancy and chemo  -she feels creon is making her diarrhea worse, so she has stopped  - She can use imodium and I order lomotil for her to fill if she needs.  -Has 2-3 episodes per day, takes lomotil twice per day, I encouraged her to maximize use and take lomotil 4 times daily.    3.  Anorexia and weight loss  -She has been drinking boost 3 bottles a day, which has been stabilized lately -She has used marijuana in the past, still has some at home, she will try marijuana for her anorexia. -I previously advised her to ween off Ativan to once daily.  -She was prescribed Mirtazapine on 07/11/17 to help with her appetite.  -she is eating better, weight stable lately  -Her appetite has improved with Rx Marinol. She is also using marijuana at home to aid with increasing her appetite.  -Continue on Rx Marinol, will discuss increasing the dosage if needed.   -She has a good appetite off Marinol, weight is low but stable. I encouraged her to try to gain weight -Weight stable.    4. Smoking Cessation   -She is interested in quitting smoking, we previously encouraged her. She does not want to try nicotine products. She  is asking about Chantix, has apt with PCP next week, previously recommended she discuss this with PCP.  -She is ready to quit smoking, I encouraged her and recommend she start with nicotine products.  -She is currently trying to Nicoderm CQ patch, and working on quitting.    5. Goal of care discussion  -We again discussed the incurable nature of her cancer, and the overall poor prognosis, especially if she does not have good response to chemotherapy or progress on chemo -The patient understands the goal of care is palliative. -I recommend DNR/DNI, she will think about it   6. 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 No functional limitations or pain. Will monitor closely.     Plan  -I filled out her Handicap form for her today  -Skin reviewed, she had a mixed response to chemo, due to the slightly distal progression, will continue current regiment gemcitabine and Abraxane cycle 9 today, and switch to second line in 3 weeks. -Lab, flush, f/u with me or Lacie and 5-fu pump and liposomal irinotecan  in 3 and 5 weeks   All questions were answered. The patient knows to call the clinic with any problems, questions or concerns.  I spent 20 minutes counseling the patient face to face. The total time spent in the appointment was 25 minutes and more than 50% was on counseling.  This document serves as a record of services personally performed by Truitt Merle, MD. It was created on her behalf by Joslyn Devon, a trained medical scribe. The creation of this record is based on the scribe's personal observations and the provider's statements to them.   I have reviewed the  above documentation for accuracy and completeness, and I agree with the above.    Truitt Merle, MD 01/15/18

## 2018-01-15 ENCOUNTER — Inpatient Hospital Stay: Payer: Medicare Other

## 2018-01-15 ENCOUNTER — Encounter: Payer: Self-pay | Admitting: Hematology

## 2018-01-15 ENCOUNTER — Telehealth: Payer: Self-pay | Admitting: Hematology

## 2018-01-15 ENCOUNTER — Inpatient Hospital Stay (HOSPITAL_BASED_OUTPATIENT_CLINIC_OR_DEPARTMENT_OTHER): Payer: Medicare Other | Admitting: Hematology

## 2018-01-15 VITALS — BP 108/67 | HR 80 | Temp 98.3°F | Resp 18 | Ht 64.0 in | Wt 89.9 lb

## 2018-01-15 DIAGNOSIS — R630 Anorexia: Secondary | ICD-10-CM | POA: Diagnosis not present

## 2018-01-15 DIAGNOSIS — C259 Malignant neoplasm of pancreas, unspecified: Secondary | ICD-10-CM

## 2018-01-15 DIAGNOSIS — F17210 Nicotine dependence, cigarettes, uncomplicated: Secondary | ICD-10-CM | POA: Diagnosis not present

## 2018-01-15 DIAGNOSIS — C787 Secondary malignant neoplasm of liver and intrahepatic bile duct: Secondary | ICD-10-CM

## 2018-01-15 DIAGNOSIS — G629 Polyneuropathy, unspecified: Secondary | ICD-10-CM

## 2018-01-15 DIAGNOSIS — Z1379 Encounter for other screening for genetic and chromosomal anomalies: Secondary | ICD-10-CM

## 2018-01-15 DIAGNOSIS — Z7189 Other specified counseling: Secondary | ICD-10-CM

## 2018-01-15 DIAGNOSIS — Z7689 Persons encountering health services in other specified circumstances: Secondary | ICD-10-CM | POA: Diagnosis not present

## 2018-01-15 DIAGNOSIS — K529 Noninfective gastroenteritis and colitis, unspecified: Secondary | ICD-10-CM

## 2018-01-15 DIAGNOSIS — D701 Agranulocytosis secondary to cancer chemotherapy: Secondary | ICD-10-CM | POA: Diagnosis not present

## 2018-01-15 DIAGNOSIS — Z5111 Encounter for antineoplastic chemotherapy: Secondary | ICD-10-CM | POA: Diagnosis not present

## 2018-01-15 LAB — COMPREHENSIVE METABOLIC PANEL
ALT: 18 U/L (ref 0–55)
AST: 19 U/L (ref 5–34)
Albumin: 3.7 g/dL (ref 3.5–5.0)
Alkaline Phosphatase: 95 U/L (ref 40–150)
Anion gap: 7 (ref 3–11)
BILIRUBIN TOTAL: 0.2 mg/dL (ref 0.2–1.2)
BUN: 11 mg/dL (ref 7–26)
CALCIUM: 9 mg/dL (ref 8.4–10.4)
CO2: 22 mmol/L (ref 22–29)
CREATININE: 0.8 mg/dL (ref 0.60–1.10)
Chloride: 111 mmol/L — ABNORMAL HIGH (ref 98–109)
GFR calc Af Amer: >60 mL/min (ref >60)
Glucose, Bld: 102 mg/dL (ref 70–140)
POTASSIUM: 3.8 mmol/L (ref 3.5–5.1)
Sodium: 140 mmol/L (ref 136–145)
TOTAL PROTEIN: 7.1 g/dL (ref 6.4–8.3)

## 2018-01-15 LAB — CBC WITH DIFFERENTIAL/PLATELET
BASOS ABS: 0 10*3/uL (ref 0.0–0.1)
BASOS PCT: 0 %
EOS ABS: 0.2 10*3/uL (ref 0.0–0.5)
EOS PCT: 3 %
HCT: 28.3 % — ABNORMAL LOW (ref 34.8–46.6)
Hemoglobin: 9.5 g/dL — ABNORMAL LOW (ref 11.6–15.9)
Lymphocytes Relative: 18 %
Lymphs Abs: 1.3 10*3/uL (ref 0.9–3.3)
MCH: 36.6 pg — ABNORMAL HIGH (ref 25.1–34.0)
MCHC: 33.6 g/dL (ref 31.5–36.0)
MCV: 109 fL — ABNORMAL HIGH (ref 79.5–101.0)
Monocytes Absolute: 0.6 10*3/uL (ref 0.1–0.9)
Monocytes Relative: 8 %
Neutro Abs: 4.9 10*3/uL (ref 1.5–6.5)
Neutrophils Relative %: 71 %
PLATELETS: 281 10*3/uL (ref 145–400)
RBC: 2.6 MIL/uL — AB (ref 3.70–5.45)
RDW: 16.5 % — ABNORMAL HIGH (ref 11.2–14.5)
WBC: 7 10*3/uL (ref 3.9–10.3)

## 2018-01-15 MED ORDER — PACLITAXEL PROTEIN-BOUND CHEMO INJECTION 100 MG
100.0000 mg/m2 | Freq: Once | INTRAVENOUS | Status: AC
  Administered 2018-01-15: 125 mg via INTRAVENOUS
  Filled 2018-01-15: qty 25

## 2018-01-15 MED ORDER — SODIUM CHLORIDE 0.9% FLUSH
10.0000 mL | INTRAVENOUS | Status: DC | PRN
  Administered 2018-01-15: 10 mL
  Filled 2018-01-15: qty 10

## 2018-01-15 MED ORDER — GEMCITABINE HCL CHEMO INJECTION 1 GM/26.3ML
800.0000 mg/m2 | Freq: Once | INTRAVENOUS | Status: AC
  Administered 2018-01-15: 1064 mg via INTRAVENOUS
  Filled 2018-01-15: qty 27.98

## 2018-01-15 MED ORDER — SODIUM CHLORIDE 0.9% FLUSH
10.0000 mL | Freq: Once | INTRAVENOUS | Status: AC
  Administered 2018-01-15: 10 mL
  Filled 2018-01-15: qty 10

## 2018-01-15 MED ORDER — PROCHLORPERAZINE MALEATE 10 MG PO TABS
ORAL_TABLET | ORAL | Status: AC
  Filled 2018-01-15: qty 1

## 2018-01-15 MED ORDER — SODIUM CHLORIDE 0.9 % IV SOLN
Freq: Once | INTRAVENOUS | Status: AC
  Administered 2018-01-15: 14:00:00 via INTRAVENOUS

## 2018-01-15 MED ORDER — HEPARIN SOD (PORK) LOCK FLUSH 100 UNIT/ML IV SOLN
500.0000 [IU] | Freq: Once | INTRAVENOUS | Status: AC | PRN
  Administered 2018-01-15: 500 [IU]
  Filled 2018-01-15: qty 5

## 2018-01-15 MED ORDER — PROCHLORPERAZINE MALEATE 10 MG PO TABS
10.0000 mg | ORAL_TABLET | Freq: Once | ORAL | Status: AC
  Administered 2018-01-15: 10 mg via ORAL

## 2018-01-15 NOTE — Telephone Encounter (Signed)
Gave pt avs and calendar with appts per 5/28 los.

## 2018-01-15 NOTE — Patient Instructions (Signed)
[facility] Discharge Instructions for Patients Receiving Chemotherapy  Today you received the following chemotherapy agents: Abraxane and Gemzar   To help prevent nausea and vomiting after your treatment, we encourage you to take your nausea medication as directed.    If you develop nausea and vomiting that is not controlled by your nausea medication, call the clinic.   BELOW ARE SYMPTOMS THAT SHOULD BE REPORTED IMMEDIATELY:  *FEVER GREATER THAN 100.5 F  *CHILLS WITH OR WITHOUT FEVER  NAUSEA AND VOMITING THAT IS NOT CONTROLLED WITH YOUR NAUSEA MEDICATION  *UNUSUAL SHORTNESS OF BREATH  *UNUSUAL BRUISING OR BLEEDING  TENDERNESS IN MOUTH AND THROAT WITH OR WITHOUT PRESENCE OF ULCERS  *URINARY PROBLEMS  *BOWEL PROBLEMS  UNUSUAL RASH Items with * indicate a potential emergency and should be followed up as soon as possible.  Feel free to call the clinic should you have any questions or concerns. The clinic phone number is [phone].  Please show the CHEMO ALERT CARD at check-in to the Emergency Department and triage nurse.   

## 2018-01-22 ENCOUNTER — Inpatient Hospital Stay: Payer: Medicare Other | Admitting: Nutrition

## 2018-01-22 ENCOUNTER — Inpatient Hospital Stay: Payer: Medicare Other | Attending: Hematology

## 2018-01-22 ENCOUNTER — Inpatient Hospital Stay: Payer: Medicare Other

## 2018-01-22 VITALS — BP 118/76 | HR 77 | Temp 98.1°F | Resp 17

## 2018-01-22 DIAGNOSIS — Z7189 Other specified counseling: Secondary | ICD-10-CM

## 2018-01-22 DIAGNOSIS — Z5189 Encounter for other specified aftercare: Secondary | ICD-10-CM | POA: Diagnosis not present

## 2018-01-22 DIAGNOSIS — C258 Malignant neoplasm of overlapping sites of pancreas: Secondary | ICD-10-CM | POA: Diagnosis not present

## 2018-01-22 DIAGNOSIS — G629 Polyneuropathy, unspecified: Secondary | ICD-10-CM | POA: Diagnosis not present

## 2018-01-22 DIAGNOSIS — F17211 Nicotine dependence, cigarettes, in remission: Secondary | ICD-10-CM | POA: Insufficient documentation

## 2018-01-22 DIAGNOSIS — I959 Hypotension, unspecified: Secondary | ICD-10-CM | POA: Diagnosis not present

## 2018-01-22 DIAGNOSIS — C259 Malignant neoplasm of pancreas, unspecified: Secondary | ICD-10-CM

## 2018-01-22 DIAGNOSIS — K529 Noninfective gastroenteritis and colitis, unspecified: Secondary | ICD-10-CM | POA: Diagnosis not present

## 2018-01-22 DIAGNOSIS — Z9221 Personal history of antineoplastic chemotherapy: Secondary | ICD-10-CM | POA: Diagnosis not present

## 2018-01-22 DIAGNOSIS — Z5111 Encounter for antineoplastic chemotherapy: Secondary | ICD-10-CM | POA: Diagnosis not present

## 2018-01-22 DIAGNOSIS — C787 Secondary malignant neoplasm of liver and intrahepatic bile duct: Secondary | ICD-10-CM | POA: Insufficient documentation

## 2018-01-22 DIAGNOSIS — R630 Anorexia: Secondary | ICD-10-CM | POA: Insufficient documentation

## 2018-01-22 DIAGNOSIS — R5383 Other fatigue: Secondary | ICD-10-CM | POA: Diagnosis not present

## 2018-01-22 LAB — COMPREHENSIVE METABOLIC PANEL
ALT: 12 U/L (ref 0–55)
AST: 19 U/L (ref 5–34)
Albumin: 3.7 g/dL (ref 3.5–5.0)
Alkaline Phosphatase: 99 U/L (ref 40–150)
Anion gap: 6 (ref 3–11)
BUN: 12 mg/dL (ref 7–26)
CHLORIDE: 113 mmol/L — AB (ref 98–109)
CO2: 21 mmol/L — ABNORMAL LOW (ref 22–29)
CREATININE: 1.23 mg/dL — AB (ref 0.60–1.10)
Calcium: 8.9 mg/dL (ref 8.4–10.4)
GFR, EST AFRICAN AMERICAN: 51 mL/min — AB (ref >60)
GFR, EST NON AFRICAN AMERICAN: 44 mL/min — AB (ref >60)
Glucose, Bld: 71 mg/dL (ref 70–140)
POTASSIUM: 4.2 mmol/L (ref 3.5–5.1)
Sodium: 140 mmol/L (ref 136–145)
TOTAL PROTEIN: 7 g/dL (ref 6.4–8.3)

## 2018-01-22 LAB — CBC WITH DIFFERENTIAL/PLATELET
BASOS ABS: 0.1 10*3/uL (ref 0.0–0.1)
Basophils Relative: 2 %
EOS ABS: 0 10*3/uL (ref 0.0–0.5)
EOS PCT: 1 %
HCT: 28.3 % — ABNORMAL LOW (ref 34.8–46.6)
HEMOGLOBIN: 9.2 g/dL — AB (ref 11.6–15.9)
LYMPHS PCT: 42 %
Lymphs Abs: 1.4 10*3/uL (ref 0.9–3.3)
MCH: 35.1 pg — ABNORMAL HIGH (ref 25.1–34.0)
MCHC: 32.5 g/dL (ref 31.5–36.0)
MCV: 108 fL — ABNORMAL HIGH (ref 79.5–101.0)
Monocytes Absolute: 0.3 10*3/uL (ref 0.1–0.9)
Monocytes Relative: 8 %
NEUTROS PCT: 47 %
Neutro Abs: 1.6 10*3/uL (ref 1.5–6.5)
PLATELETS: 194 10*3/uL (ref 145–400)
RBC: 2.62 MIL/uL — AB (ref 3.70–5.45)
RDW: 14.8 % — ABNORMAL HIGH (ref 11.2–14.5)
WBC: 3.5 10*3/uL — AB (ref 3.9–10.3)

## 2018-01-22 MED ORDER — PEGFILGRASTIM 6 MG/0.6ML ~~LOC~~ PSKT
PREFILLED_SYRINGE | SUBCUTANEOUS | Status: AC
Start: 2018-01-22 — End: ?
  Filled 2018-01-22: qty 0.6

## 2018-01-22 MED ORDER — SODIUM CHLORIDE 0.9% FLUSH
10.0000 mL | Freq: Once | INTRAVENOUS | Status: AC
  Administered 2018-01-22: 10 mL
  Filled 2018-01-22: qty 10

## 2018-01-22 MED ORDER — PEGFILGRASTIM 6 MG/0.6ML ~~LOC~~ PSKT
6.0000 mg | PREFILLED_SYRINGE | Freq: Once | SUBCUTANEOUS | Status: AC
  Administered 2018-01-22: 6 mg via SUBCUTANEOUS

## 2018-01-22 MED ORDER — SODIUM CHLORIDE 0.9 % IV SOLN
Freq: Once | INTRAVENOUS | Status: AC
  Administered 2018-01-22: 13:00:00 via INTRAVENOUS

## 2018-01-22 MED ORDER — PACLITAXEL PROTEIN-BOUND CHEMO INJECTION 100 MG
100.0000 mg/m2 | Freq: Once | INTRAVENOUS | Status: AC
  Administered 2018-01-22: 125 mg via INTRAVENOUS
  Filled 2018-01-22: qty 25

## 2018-01-22 MED ORDER — SODIUM CHLORIDE 0.9 % IV SOLN
800.0000 mg/m2 | Freq: Once | INTRAVENOUS | Status: AC
  Administered 2018-01-22: 1064 mg via INTRAVENOUS
  Filled 2018-01-22: qty 27.98

## 2018-01-22 MED ORDER — PROCHLORPERAZINE MALEATE 10 MG PO TABS
ORAL_TABLET | ORAL | Status: AC
  Filled 2018-01-22: qty 1

## 2018-01-22 MED ORDER — SODIUM CHLORIDE 0.9% FLUSH
10.0000 mL | INTRAVENOUS | Status: DC | PRN
  Administered 2018-01-22: 10 mL
  Filled 2018-01-22: qty 10

## 2018-01-22 MED ORDER — PROCHLORPERAZINE MALEATE 10 MG PO TABS
10.0000 mg | ORAL_TABLET | Freq: Once | ORAL | Status: AC
  Administered 2018-01-22: 10 mg via ORAL

## 2018-01-22 MED ORDER — HEPARIN SOD (PORK) LOCK FLUSH 100 UNIT/ML IV SOLN
500.0000 [IU] | Freq: Once | INTRAVENOUS | Status: AC | PRN
Start: 2018-01-22 — End: 2018-01-22
  Administered 2018-01-22: 500 [IU]
  Filled 2018-01-22: qty 5

## 2018-01-22 NOTE — Progress Notes (Signed)
Nutrition follow-up completed with patient receiving chemotherapy for metastatic pancreas cancer. Weight is stable and documented as 89.9 pounds on May 28. Patient feels well and eats well. She denies nausea and vomiting. She reports diarrhea has improved She still will not take Creon because it makes her diarrhea worse. Patient has no nutrition impact symptoms today  Nutrition diagnosis: Unintended weight loss improved  Intervention: Patient was educated to continue strategies for adequate calories and protein Oral nutrition supplements as tolerated. Teach back method use  Monitoring, evaluation, goals: Patient will tolerate adequate calories and protein for weight maintenance.  Next visit: Tuesday, July 2 during infusion.  **Disclaimer: This note was dictated with voice recognition software. Similar sounding words can inadvertently be transcribed and this note may contain transcription errors which may not have been corrected upon publication of note.**

## 2018-01-22 NOTE — Patient Instructions (Signed)
Remove On Pro no earlier than 10pm on 12/04/2017.  [facility] Discharge Instructions for Patients Receiving Chemotherapy  Today you received the following chemotherapy agents:  Abraxane and Gemzar.  To help prevent nausea and vomiting after your treatment, we encourage you to take your nausea medication as directed.   If you develop nausea and vomiting that is not controlled by your nausea medication, call the clinic.   BELOW ARE SYMPTOMS THAT SHOULD BE REPORTED IMMEDIATELY:  *FEVER GREATER THAN 100.5 F  *CHILLS WITH OR WITHOUT FEVER  NAUSEA AND VOMITING THAT IS NOT CONTROLLED WITH YOUR NAUSEA MEDICATION  *UNUSUAL SHORTNESS OF BREATH  *UNUSUAL BRUISING OR BLEEDING  TENDERNESS IN MOUTH AND THROAT WITH OR WITHOUT PRESENCE OF ULCERS  *URINARY PROBLEMS  *BOWEL PROBLEMS  UNUSUAL RASH Items with * indicate a potential emergency and should be followed up as soon as possible.  Feel free to call the clinic should you have any questions or concerns. The clinic phone number is [phone].  Please show the [place] at check-in to the Emergency Department and triage nurse.

## 2018-02-04 ENCOUNTER — Other Ambulatory Visit: Payer: Self-pay | Admitting: Hematology

## 2018-02-04 NOTE — Progress Notes (Signed)
[facility]  [phone] [phone]  Clinic Follow up Note   Patient Care Team: Wilford Corner, MD as PCP - General (Gastroenterology) Truitt Merle, MD as Consulting Physician (Hematology) 02/05/2018  SUMMARY OF ONCOLOGIC HISTORY:   Pancreatic cancer metastasized to liver ([place])   06/21/2017 Imaging    CT CAP IMPRESSION: 1. Indistinct low-attenuation mass in the uncinate process of the pancreas with dilatation of remainder of the pancreatic duct and some encasement of the SMA. These findings are highly indicative of pancreatic carcinoma. Recommend MRI to assess further. 2. Multiple low-attenuation lesions throughout the right and left lobes of liver most consistent with liver metastasis again MRI may be helpful. 3. Moderate change of abdominal aortic atherosclerosis. 4. Changes of centrilobular and paraseptal emphysema on CT of the chest. No lung lesion is seen.       07/06/2017 Initial Biopsy    Diagnosis Liver, needle/core biopsy, Right lobe - METASTATIC ADENOCARCINOMA.      07/10/2017 Initial Diagnosis    Pancreatic cancer metastasized to liver ([place])      07/25/2017 -  Chemotherapy    Gemcitabine and Abraxane 3 weeks on and 1 week off starting 07/25/17       10/02/2017 Imaging    CT CAP W Contrast 10/02/17 IMPRESSION: 1. Reduced size of the primary pancreatic neoplasm, which still continues to encase the superior mesenteric artery and occlude the superior mesenteric vein. The numerous metastatic liver lesions show some improvement on portal venous phase images, with increase in the central necrosis and slight reduction in size. However, the arterial phase images, which are present today but were not available on the prior exam, demonstrate that there is a significant peripheral rind around each of these lesions and that the portal venous phase images underestimate total lesions size. No findings of metastatic disease to the  chest or skeleton. 2. Severe and worsened cachexia. 3. Low-grade mesenteric edema and a small amount of pelvic ascites. 4. Aortic Atherosclerosis (ICD10-I70.0) and Emphysema (ICD10-J43.9). 5.  Prominent stool throughout the colon favors constipation.      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  The Hereditary Gene Panel offered by [facility] includes sequencing and/or deletion duplication testing of the following 47 genes: APC, ATM, AXIN2, BARD1, BMPR1A, BRCA1, BRCA2, BRIP1, CDH1, CDK4, CDKN2A (p14ARF), CDKN2A (p16INK4a), CHEK2, CTNNA1, DICER1, EPCAM (Deletion/duplication testing only), GREM1 (promoter region deletion/duplication testing only), KIT, MEN1, MLH1, MSH2, MSH3, MSH6, MUTYH, NBN, NF1, NHTL1, PALB2, PDGFRA, PMS2, POLD1, POLE, PTEN, RAD50, RAD51C, RAD51D, SDHB, SDHC, SDHD, SMAD4, SMARCA4. STK11, TP53, TSC1, TSC2, and VHL.  The following genes were evaluated for sequence changes only: SDHA and HOXB13 c.251G>A variant only. The report date is November 27, 2017.       01/08/2018 Imaging    CT CAP W Contrast 01/08/18  IMPRESSION: 1. Overall, there appears to be slight progression of disease, as evidenced by slight growth of the primary pancreatic lesion. While some of the previously noted hepatic lesions appear to have resolved or gotten smaller, others appear to be new or are slightly larger than the prior examination, as detailed above. 2. Several unusual appearing pulmonary nodules scattered throughout the lungs bilaterally, some of which are solid while others are more subsolid in appearance. Although these could be infectious or inflammatory in etiology, the possibility of developing metastatic disease in the lungs should be considered, and close attention on follow-up studies is recommended. 3. Aortic  atherosclerosis. 4. Additional incidental findings, as above. Aortic Atherosclerosis  (ICD10-I70.0).       02/04/2018 -  Chemotherapy    The patient had leucovorin 544 mg in dextrose 5 % 250 mL infusion, 400 mg/m2 = 544 mg, Intravenous,  Once, 1 of 4 cycles fluorouracil (ADRUCIL) 3,250 mg in sodium chloride 0.9 % 85 mL chemo infusion, 2,400 mg/m2 = 3,250 mg, Intravenous, 1 Day/Dose, 1 of 4 cycles irinotecan LIPOSOME (ONIVYDE) 94.6 mg in sodium chloride 0.9 % 500 mL chemo infusion, 70 mg/m2 = 94.6 mg, Intravenous, Once, 1 of 4 cycles  for chemotherapy treatment.      PRIOR THERAPY:  first line Gemcitabine and Abraxane 2 weeks on and 1 week off started 07/25/17 - 01/22/2018 s/p 9 cycles; discontinued due to slight disease progression   CURRENT THERAPY: 5-fu pump and liposomal irinotecan every 2 weeks started 02/05/18   INTERVAL HISTORY: Ms. Hungate returns for f/u as scheduled and to begin new regimen with liposomal-irinotecan/5FU. She feels well, denies pain. 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 Appetite is improved, despite 2 lbs weight loss. She has never liked food. On Remeron 1 tab qHS. Drinks 2-3 boost per day. She has found if she eats 7-8 butterfinger mini's before bed she has a formed stool in the morning, but continues to have 2 watery stools throughout the day. Takes 1 imodium before meals and again with each loose stool. Takes 1 lomotil occasionally. Has productive cough with clear sputum at baseline, no fever, chills, dyspnea, or chest pain. Has clear rhinorrhea when she works outside. Wearing nicotine patch, now down to 7 cigarettes per day, smokes only 1/2 of each.   REVIEW OF SYSTEMS:   Constitutional: Denies fatigue, fevers, chills (+) improved appetite (+) 2 lbs weight loss Eyes: Denies blurriness of vision Ears, nose, mouth, throat, and face: Denies mucositis or sore throat (+) clear rhinorrhea when working outside  Respiratory: Denies dyspnea or wheezes (+) productive cough, clear sputum at baseline Cardiovascular: Denies  palpitation, chest discomfort or lower extremity swelling Gastrointestinal:  Denies nausea, vomiting, constipation, heartburn, abdominal pain, or change in bowel habits (+) diarrhea 2/day  Skin: Denies abnormal skin rashes Lymphatics: Denies new lymphadenopathy or easy bruising Neurological:Denies new weaknesses (+) R foot neuropathy at night, stable  Behavioral/Psych: Mood is stable, no new changes  All other systems were reviewed with the patient and are negative.  MEDICAL HISTORY:  Past Medical History:  Diagnosis Date  . Anxiety   . Family history of colon cancer   . Family history of pancreatic cancer   . Family history of prostate cancer     SURGICAL HISTORY: Past Surgical History:  Procedure Laterality Date  . IR FLUORO GUIDE PORT INSERTION RIGHT  07/24/2017  . IR US GUIDE VASC ACCESS RIGHT  07/24/2017    I have reviewed the social history and family history with the patient and they are unchanged from previous note.  ALLERGIES:  has No Known Allergies.  MEDICATIONS:  Current Outpatient Medications  Medication Sig Dispense Refill  . diphenoxylate-atropine (LOMOTIL) 2.5-0.025 MG tablet TAKE 1 TABLET BY MOUTH 4 TIMES DAILY AS NEEDED FOR  DIARRHEA  OR  LOOSE  STOOLS 30 tablet 2  . LORazepam (ATIVAN) 1 MG tablet Take 1 mg 2 (two) times daily by mouth.    . mirtazapine (REMERON) 15 MG tablet Take 1 tablet (15 mg total) by mouth at bedtime. 30 tablet 3  . Multiple Vitamin (MULTIVITAMIN WITH MINERALS) TABS  tablet Take 1 tablet daily by mouth. One-A-Day for Women    . potassium chloride SA (K-DUR,KLOR-CON) 20 MEQ tablet Take 1 tablet (20 mEq total) by mouth daily. 40 tablet 1   No current facility-administered medications for this visit.    Facility-Administered Medications Ordered in Other Visits  Medication Dose Route Frequency Provider Last Rate Last Dose  . fluorouracil (ADRUCIL) 3,250 mg in sodium chloride 0.9 % 85 mL chemo infusion  2,400 mg/m2 (Treatment Plan Recorded)  Intravenous 1 day or 1 dose Truitt Merle, MD      . heparin lock flush 100 unit/mL  500 Units Intracatheter Once PRN Truitt Merle, MD      . sodium chloride flush (NS) 0.9 % injection 10 mL  10 mL Intracatheter PRN Truitt Merle, MD        PHYSICAL EXAMINATION: ECOG PERFORMANCE STATUS: 1 - Symptomatic but completely ambulatory  Vitals:   02/05/18 0825  BP: 123/69  Pulse: 70  Resp: 18  Temp: 98 F (36.7 C)  SpO2: 100%   Filed Weights   02/05/18 0825  Weight: 87 lb 14.4 oz (39.9 kg)    GENERAL:alert, no distress and comfortable; thin adult female SKIN: no rashes or significant lesions EYES: normal, Conjunctiva are pink and non-injected, sclera clear OROPHARYNX:no thrush or ulcers  LYMPH:  no palpable cervical or supraclavicular lymphadenopathy in the cervical, axillary or inguinal LUNGS: clear to auscultation and percussion with normal breathing effort HEART: regular rate & rhythm and no murmurs and no lower extremity edema ABDOMEN:abdomen soft, non-tender and normal bowel sounds Musculoskeletal:no cyanosis of digits and no clubbing  NEURO: alert & oriented x 3 with fluent speech, no focal motor/sensory deficits PAC without erythema   LABORATORY DATA:  I have reviewed the data as listed CBC Latest Ref Rng & Units 02/05/2018 01/22/2018 01/15/2018  WBC 3.9 - 10.3 K/uL 7.8 3.5(L) 7.0  Hemoglobin 11.6 - 15.9 g/dL 10.4(L) 9.2(L) 9.5(L)  Hematocrit 34.8 - 46.6 % 31.2(L) 28.3(L) 28.3(L)  Platelets 145 - 400 K/uL 301 194 281     CMP Latest Ref Rng & Units 02/05/2018 01/22/2018 01/15/2018  Glucose 70 - 140 mg/dL 100 71 102  BUN 7 - 26 mg/dL '10 12 11  ' Creatinine 0.60 - 1.10 mg/dL 0.88 1.23(H) 0.80  Sodium 136 - 145 mmol/L 141 140 140  Potassium 3.5 - 5.1 mmol/L 3.8 4.2 3.8  Chloride 98 - 109 mmol/L 112(H) 113(H) 111(H)  CO2 22 - 29 mmol/L 22 21(L) 22  Calcium 8.4 - 10.4 mg/dL 9.2 8.9 9.0  Total Protein 6.4 - 8.3 g/dL 7.4 7.0 7.1  Total Bilirubin 0.2 - 1.2 mg/dL <0.2(L) <0.2(L) 0.2  Alkaline  Phos 40 - 150 U/L 104 99 95  AST 5 - 34 U/L '17 19 19  ' ALT 0 - 55 U/L '12 12 18   ' PATHOLOGY: #1 biopsy from duodenum, distal stomach, GE junction no significant pathological changes, except reactive changes in stomach #2 transverse colon, hepatic flexure, sigmoid, polyps biopsy showed tubular adenomas (3)  Liver Biopsy  Diagnosis 07/06/17 Liver, needle/core biopsy, Right lobe - METASTATIC ADENOCARCINOMA. Microscopic Comment The tumor is positive for cytokeratin 7 and negative for cytokeratin 20, CDX-2 and TTF-1. The immunoprofile is non-specific, but the differential includes a upper GI or pancreaticobiliary primary among others. Dr. Lyndon Code has reviewed the case. Dr. Michail Sermon was paged on 07/10/2017.    RADIOGRAPHIC STUDIES: I have personally reviewed the radiological images as listed and agreed with the findings in the report. No results found.  ASSESSMENT & PLAN: 68 y.o. African-American female, with past medical history of anxiety, otherwise healthy, presented with chronic diarrhea for 4 months, poor appetite and 35lbs weight loss.  CT scan showed a pancreatic head mass and multiple liver metastasis.  1. 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 Diarrhea 3. Anorexia and weight loss 4. Smoking cessation - quitting with nicotine patch, down to 7 cigarettes per day on 6/18  5. Goal of care discussion  6. 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 Kochanski appears stable. She completed 9 cycles gemcitabine/abraxane on 01/22/18. She had evidence of slight progression in the pancreas on recent CT scan. She continues to have diarrhea, but has not maximized imodium or lomotil. I reviewed dosing and encouraged her to increase. She tried creon in the past without much benefit and was very expensive. She is on remeron for her appetite; I encouraged her to increase boost/ensure and eat more throughout the day.   Labs reviewed, adequate to begin cycle 1  liposomal-irinotecan/5FU. I reviewed potential for increased diarrhea. She will call [place] [facility] if not well controlled. I recommend close f/u with lab and visit with me next week, will include 2 hour IVF but can cancel if not needed. The patient agrees. Will continue to f/u q2 weeks with each cycle.   PLAN -Labs reviewed, proceed with cycle 1 liposomal-irinotecan/5FU, q2 weeks -Maximize imodium, lomotil -F/u with me next week to monitor diarrhea and other toxicities, IVF if needed  -Lab, flush, f/u in 2 weeks with cycle 2  All questions were answered. The patient knows to call the clinic with any problems, questions or concerns. No barriers to learning was detected.     Alla Feeling, NP 02/05/18

## 2018-02-04 NOTE — Progress Notes (Signed)
DISCONTINUE ON PATHWAY REGIMEN - Pancreatic     A cycle is every 28 days:     Nab-paclitaxel (protein bound)      Gemcitabine   **Always confirm dose/schedule in your pharmacy ordering system**  REASON: Disease Progression PRIOR TREATMENT: PANOS51: Nab-Paclitaxel (Abraxane(R)) 125 mg/m2 D1, 8, 15 + Gemcitabine 1,000 mg/m2 D1, 8, 15 q28 Days Until Progression or Toxicity TREATMENT RESPONSE: Partial Response (PR)  START ON PATHWAY REGIMEN - Pancreatic     A cycle is every 14 days:     Liposomal irinotecan      Leucovorin      5-Fluorouracil   **Always confirm dose/schedule in your pharmacy ordering system**  Patient Characteristics: Adenocarcinoma, Metastatic Disease, Second Line, MSS/pMMR or MSI Unknown, If Gemcitabine/Nab-Paclitaxel (Abraxane(R)) or If Gemcitabine First Line Histology: Adenocarcinoma Current evidence of distant metastases<= Yes AJCC T Category: [place] AJCC N Category: NX AJCC M Category: M1 AJCC 8 Stage Grouping: IV Line of Therapy: Second Line Microsatellite/Mismatch Repair Status: MSS/pMMR Intent of Therapy: Non-Curative / Palliative Intent, Discussed with Patient

## 2018-02-05 ENCOUNTER — Telehealth: Payer: Self-pay | Admitting: Nurse Practitioner

## 2018-02-05 ENCOUNTER — Inpatient Hospital Stay: Payer: Medicare Other

## 2018-02-05 ENCOUNTER — Inpatient Hospital Stay (HOSPITAL_BASED_OUTPATIENT_CLINIC_OR_DEPARTMENT_OTHER): Payer: Medicare Other | Admitting: Nurse Practitioner

## 2018-02-05 ENCOUNTER — Encounter: Payer: Self-pay | Admitting: Nurse Practitioner

## 2018-02-05 VITALS — BP 123/69 | HR 70 | Temp 98.0°F | Resp 18 | Ht 64.0 in | Wt 87.9 lb

## 2018-02-05 DIAGNOSIS — C258 Malignant neoplasm of overlapping sites of pancreas: Secondary | ICD-10-CM | POA: Diagnosis not present

## 2018-02-05 DIAGNOSIS — F17211 Nicotine dependence, cigarettes, in remission: Secondary | ICD-10-CM | POA: Diagnosis not present

## 2018-02-05 DIAGNOSIS — R630 Anorexia: Secondary | ICD-10-CM

## 2018-02-05 DIAGNOSIS — C787 Secondary malignant neoplasm of liver and intrahepatic bile duct: Secondary | ICD-10-CM

## 2018-02-05 DIAGNOSIS — Z9221 Personal history of antineoplastic chemotherapy: Secondary | ICD-10-CM

## 2018-02-05 DIAGNOSIS — G629 Polyneuropathy, unspecified: Secondary | ICD-10-CM

## 2018-02-05 DIAGNOSIS — K529 Noninfective gastroenteritis and colitis, unspecified: Secondary | ICD-10-CM | POA: Diagnosis not present

## 2018-02-05 DIAGNOSIS — C259 Malignant neoplasm of pancreas, unspecified: Secondary | ICD-10-CM

## 2018-02-05 DIAGNOSIS — Z5111 Encounter for antineoplastic chemotherapy: Secondary | ICD-10-CM | POA: Diagnosis not present

## 2018-02-05 DIAGNOSIS — Z7189 Other specified counseling: Secondary | ICD-10-CM

## 2018-02-05 DIAGNOSIS — R197 Diarrhea, unspecified: Secondary | ICD-10-CM

## 2018-02-05 LAB — COMPREHENSIVE METABOLIC PANEL
ALBUMIN: 3.7 g/dL (ref 3.5–5.0)
ALT: 12 U/L (ref 0–55)
AST: 17 U/L (ref 5–34)
Alkaline Phosphatase: 104 U/L (ref 40–150)
Anion gap: 7 (ref 3–11)
BUN: 10 mg/dL (ref 7–26)
CHLORIDE: 112 mmol/L — AB (ref 98–109)
CO2: 22 mmol/L (ref 22–29)
CREATININE: 0.88 mg/dL (ref 0.60–1.10)
Calcium: 9.2 mg/dL (ref 8.4–10.4)
GFR calc Af Amer: >60 mL/min (ref >60)
GLUCOSE: 100 mg/dL (ref 70–140)
POTASSIUM: 3.8 mmol/L (ref 3.5–5.1)
Sodium: 141 mmol/L (ref 136–145)
Total Bilirubin: <0.2 mg/dL — ABNORMAL LOW (ref 0.2–1.2)
Total Protein: 7.4 g/dL (ref 6.4–8.3)

## 2018-02-05 LAB — CBC WITH DIFFERENTIAL/PLATELET
BASOS PCT: 1 %
Basophils Absolute: 0 10*3/uL (ref 0.0–0.1)
EOS ABS: 0.1 10*3/uL (ref 0.0–0.5)
EOS PCT: 2 %
HCT: 31.2 % — ABNORMAL LOW (ref 34.8–46.6)
HEMOGLOBIN: 10.4 g/dL — AB (ref 11.6–15.9)
LYMPHS ABS: 1.1 10*3/uL (ref 0.9–3.3)
Lymphocytes Relative: 15 %
MCH: 36.4 pg — AB (ref 25.1–34.0)
MCHC: 33.2 g/dL (ref 31.5–36.0)
MCV: 109.7 fL — ABNORMAL HIGH (ref 79.5–101.0)
MONOS PCT: 7 %
Monocytes Absolute: 0.5 10*3/uL (ref 0.1–0.9)
Neutro Abs: 6 10*3/uL (ref 1.5–6.5)
Neutrophils Relative %: 75 %
Platelets: 301 10*3/uL (ref 145–400)
RBC: 2.85 MIL/uL — ABNORMAL LOW (ref 3.70–5.45)
RDW: 15.5 % — ABNORMAL HIGH (ref 11.2–14.5)
WBC: 7.8 10*3/uL (ref 3.9–10.3)

## 2018-02-05 MED ORDER — PROCHLORPERAZINE MALEATE 10 MG PO TABS
ORAL_TABLET | ORAL | Status: AC
Start: 2018-02-05 — End: ?
  Filled 2018-02-05: qty 1

## 2018-02-05 MED ORDER — HEPARIN SOD (PORK) LOCK FLUSH 100 UNIT/ML IV SOLN
500.0000 [IU] | Freq: Once | INTRAVENOUS | Status: DC | PRN
  Filled 2018-02-05: qty 5

## 2018-02-05 MED ORDER — SODIUM CHLORIDE 0.9 % IV SOLN
Freq: Once | INTRAVENOUS | Status: AC
Start: 2018-02-05 — End: 2018-02-05
  Administered 2018-02-05: 09:00:00 via INTRAVENOUS

## 2018-02-05 MED ORDER — IRINOTECAN HCL LIPOSOME CHEMO INJECTION 43 MG/10ML
95.0000 mg | INJECTION | Freq: Once | INTRAVENOUS | Status: AC
  Administered 2018-02-05: 95 mg via INTRAVENOUS
  Filled 2018-02-05: qty 22.09

## 2018-02-05 MED ORDER — PROCHLORPERAZINE MALEATE 10 MG PO TABS
10.0000 mg | ORAL_TABLET | Freq: Once | ORAL | Status: AC
  Administered 2018-02-05: 10 mg via ORAL

## 2018-02-05 MED ORDER — SODIUM CHLORIDE 0.9% FLUSH
10.0000 mL | Freq: Once | INTRAVENOUS | Status: AC
  Administered 2018-02-05: 10 mL
  Filled 2018-02-05: qty 10

## 2018-02-05 MED ORDER — SODIUM CHLORIDE 0.9% FLUSH
10.0000 mL | INTRAVENOUS | Status: DC | PRN
  Filled 2018-02-05: qty 10

## 2018-02-05 MED ORDER — SODIUM CHLORIDE 0.9 % IV SOLN
2400.0000 mg/m2 | INTRAVENOUS | Status: DC
  Administered 2018-02-05: 3250 mg via INTRAVENOUS
  Filled 2018-02-05: qty 65

## 2018-02-05 MED ORDER — LEUCOVORIN CALCIUM INJECTION 350 MG
400.0000 mg/m2 | Freq: Once | INTRAMUSCULAR | Status: AC
  Administered 2018-02-05: 544 mg via INTRAVENOUS
  Filled 2018-02-05: qty 27.2

## 2018-02-05 NOTE — Telephone Encounter (Signed)
Scheduled appt per 6/18 los - gave pt avs and calender per los. 

## 2018-02-05 NOTE — Patient Instructions (Signed)
Remove On Pro no earlier than 10pm on 12/04/2017.  [facility] Discharge Instructions for Patients Receiving Chemotherapy  Today you received the following chemotherapy agents:  liposomal irinotecan (Onivyde), leucovorin, and fluouracil (Adrucil/5FU)  To help prevent nausea and vomiting after your treatment, we encourage you to take your nausea medication as directed.   If you develop nausea and vomiting that is not controlled by your nausea medication, call the clinic.   BELOW ARE SYMPTOMS THAT SHOULD BE REPORTED IMMEDIATELY:  *FEVER GREATER THAN 100.5 F  *CHILLS WITH OR WITHOUT FEVER  NAUSEA AND VOMITING THAT IS NOT CONTROLLED WITH YOUR NAUSEA MEDICATION  *UNUSUAL SHORTNESS OF BREATH  *UNUSUAL BRUISING OR BLEEDING  TENDERNESS IN MOUTH AND THROAT WITH OR WITHOUT PRESENCE OF ULCERS  *URINARY PROBLEMS  *BOWEL PROBLEMS  UNUSUAL RASH Items with * indicate a potential emergency and should be followed up as soon as possible.  Feel free to call the clinic should you have any questions or concerns. The clinic phone number is [phone].  Please show the [facility] at check-in to the Emergency Department and triage nurse.  Irinotecan Liposomal injection What is this medicine? IRINOTECAN LIPOSOME (eye ri noe TEE kan LIP oh som) is a chemotherapy drug. It is used to treat pancreatic cancer. This medicine may be used for other purposes; ask your health care provider or pharmacist if you have questions. COMMON BRAND NAME(S): ONIVYDE What should I tell my health care provider before I take this medicine? They need to know if you have any of these conditions: -bleeding disorders -dehydration -history of blood diseases, like sickle cell anemia or leukemia -history of low levels of calcium, magnesium, or potassium in the blood -infection (especially a virus infection such as chickenpox, cold sores, or herpes) -liver disease -low blood counts, like low white  cell, platelet, or red cell counts -lung or breathing disease, like asthma -recent or ongoing radiation therapy -an unusual or allergic reaction to irinotecan liposome, other medicines, foods, dyes, or preservatives -pregnant or trying to get pregnant -breast-feeding How should I use this medicine? This drug is given as an infusion into a vein. It is administered in a hospital or clinic by a specially trained health care professional. Talk to your pediatrician regarding the use of this medicine in children. Special care may be needed. Overdosage: If you think you have taken too much of this medicine contact a poison control center or emergency room at once. NOTE: This medicine is only for you. Do not share this medicine with others. What if I miss a dose? It is important not to miss your dose. Call your doctor or health care professional if you are unable to keep an appointment. What may interact with this medicine? This medicine may interact with the following medications: -antiviral medicines for HIV or AIDS -certain medications for fungal infections like ketoconazole, itraconazole, voriconazole -certain medications for seizures like carbamazepine, fosphenytoin, phenytoin -clarithromycin -gemfibrozil -mephobarbital -nefazodone -phenobarbital -primidone -rifabutin -rifampin -rifapentine -St. John's Wort -telaprevir This list may not describe all possible interactions. Give your health care provider a list of all the medicines, herbs, non-prescription drugs, or dietary supplements you use. Also tell them if you smoke, drink alcohol, or use illegal drugs. Some items may interact with your medicine. What should I watch for while using this medicine? Check with your doctor or health care professional if you get an attack of severe diarrhea, nausea and vomiting, or if you sweat a lot. The loss of too  much body fluid can make it dangerous for you to take this medicine. This medicine may  interfere with the ability to have a child. You should talk with your doctor or health care professional if you are concerned about your fertility. Do not become pregnant while taking this medicine or for 1 month after the last dose; males with female partners should use condoms during treatment and for 4 months after the last dose. Women should inform their doctor if they wish to become pregnant or think they might be pregnant. There is a potential for serious side effects to an unborn child. Talk to your health care professional or pharmacist for more information. Do not breast-feed an infant while taking this medicine or for 1 month after the last dose. Avoid taking products that contain aspirin, acetaminophen, ibuprofen, naproxen, or ketoprofen unless instructed by your doctor. These medicines may hide a fever. Be careful brushing and flossing your teeth or using a toothpick because you may get an infection or bleed more easily. If you have any dental work done, tell your dentist you are receiving this medicine. Call your doctor or health care professional for advice if you get a fever, chills or sore throat, or other symptoms of a cold or flu. Do not treat yourself. This drug decreases your body's ability to fight infections. Try to avoid being around people who are sick. This medicine may increase your risk to bruise or bleed. Call your doctor or health care professional if you notice any unusual bleeding. This drug may make you feel generally unwell. This is not uncommon, as chemotherapy can affect healthy cells as well as cancer cells. Report any side effects. Continue your course of treatment even though you feel ill unless your doctor tells you to stop. What side effects may I notice from receiving this medicine? Side effects that you should report to your doctor or health care professional as soon as possible: -allergic reactions like skin rash, itching or hives, swelling of the face, lips, or  tongue -breathing problems -cough -diarrhea -low blood counts - this medicine may decrease the number of white blood cells, red blood cells and platelets. You may be at increased risk for infections and bleeding -nausea, vomiting -signs and symptoms of bleeding such as bloody or black, tarry stools; red or dark-brown urine; spitting up blood or brown material that looks like coffee grounds; red spots on the skin; unusual bruising or bleeding from the eye, gums, or nose -signs of decreased red blood cells - unusually weak or tired, feeling faint or lightheaded, falls -signs and symptoms of infection like fever or chills; cough; sore throat; pain or trouble passing urine -signs and symptoms of liver injury like dark yellow or brown urine; general ill feeling or flu-like symptoms; light-colored stools; loss of appetite; nausea; right upper belly pain; unusually weak or tired; yellowing of the eyes or skin -stomach pain Side effects that usually do not require medical attention (report to your doctor or health care professional if they continue or are bothersome): -hair loss -loss of appetite -mouth sores -upset stomach This list may not describe all possible side effects. Call your doctor for medical advice about side effects. You may report side effects to FDA at 1-800-FDA-1088. Where should I keep my medicine? This drug is given in a hospital or clinic and will not be stored at home. NOTE: This sheet is a summary. It may not cover all possible information. If you have questions about this medicine, talk  to your doctor, pharmacist, or health care provider.  2018 [employer] Standard (2015-09-09 10:14:31)  Leucovorin injection What is this medicine? LEUCOVORIN (loo koe VOR in) is used to prevent or treat the harmful effects of some medicines. This medicine is used to treat anemia caused by a low amount of folic acid in the body. It is also used with 5-fluorouracil (5-FU) to treat colon  cancer. This medicine may be used for other purposes; ask your health care provider or pharmacist if you have questions. What should I tell my health care provider before I take this medicine? They need to know if you have any of these conditions: -anemia from low levels of vitamin B-12 in the blood -an unusual or allergic reaction to leucovorin, folic acid, other medicines, foods, dyes, or preservatives -pregnant or trying to get pregnant -breast-feeding How should I use this medicine? This medicine is for injection into a muscle or into a vein. It is given by a health care professional in a hospital or clinic setting. Talk to your pediatrician regarding the use of this medicine in children. Special care may be needed. Overdosage: If you think you have taken too much of this medicine contact a poison control center or emergency room at once. NOTE: This medicine is only for you. Do not share this medicine with others. What if I miss a dose? This does not apply. What may interact with this medicine? -capecitabine -fluorouracil -phenobarbital -phenytoin -primidone -trimethoprim-sulfamethoxazole This list may not describe all possible interactions. Give your health care provider a list of all the medicines, herbs, non-prescription drugs, or dietary supplements you use. Also tell them if you smoke, drink alcohol, or use illegal drugs. Some items may interact with your medicine. What should I watch for while using this medicine? Your condition will be monitored carefully while you are receiving this medicine. This medicine may increase the side effects of 5-fluorouracil, 5-FU. Tell your doctor or health care professional if you have diarrhea or mouth sores that do not get better or that get worse. What side effects may I notice from receiving this medicine? Side effects that you should report to your doctor or health care professional as soon as possible: -allergic reactions like skin rash,  itching or hives, swelling of the face, lips, or tongue -breathing problems -fever, infection -mouth sores -unusual bleeding or bruising -unusually weak or tired Side effects that usually do not require medical attention (report to your doctor or health care professional if they continue or are bothersome): -constipation or diarrhea -loss of appetite -nausea, vomiting This list may not describe all possible side effects. Call your doctor for medical advice about side effects. You may report side effects to FDA at 1-800-FDA-1088. Where should I keep my medicine? This drug is given in a hospital or clinic and will not be stored at home. NOTE: This sheet is a summary. It may not cover all possible information. If you have questions about this medicine, talk to your doctor, pharmacist, or health care provider.  2018 [employer]/Gold Standard (2008-02-11 16:50:29)  Fluorouracil, 5-FU injection What is this medicine? FLUOROURACIL, 5-FU (flure oh YOOR a sil) is a chemotherapy drug. It slows the growth of cancer cells. This medicine is used to treat many types of cancer like breast cancer, colon or rectal cancer, pancreatic cancer, and stomach cancer. This medicine may be used for other purposes; ask your health care provider or pharmacist if you have questions. COMMON BRAND NAME(S): Adrucil What should I tell my health care  provider before I take this medicine? They need to know if you have any of these conditions: -blood disorders -dihydropyrimidine dehydrogenase (DPD) deficiency -infection (especially a virus infection such as chickenpox, cold sores, or herpes) -kidney disease -liver disease -malnourished, poor nutrition -recent or ongoing radiation therapy -an unusual or allergic reaction to fluorouracil, other chemotherapy, other medicines, foods, dyes, or preservatives -pregnant or trying to get pregnant -breast-feeding How should I use this medicine? This drug is given as an infusion  or injection into a vein. It is administered in a hospital or clinic by a specially trained health care professional. Talk to your pediatrician regarding the use of this medicine in children. Special care may be needed. Overdosage: If you think you have taken too much of this medicine contact a poison control center or emergency room at once. NOTE: This medicine is only for you. Do not share this medicine with others. What if I miss a dose? It is important not to miss your dose. Call your doctor or health care professional if you are unable to keep an appointment. What may interact with this medicine? -allopurinol -cimetidine -dapsone -digoxin -hydroxyurea -leucovorin -levamisole -medicines for seizures like ethotoin, fosphenytoin, phenytoin -medicines to increase blood counts like filgrastim, pegfilgrastim, sargramostim -medicines that treat or prevent blood clots like warfarin, enoxaparin, and dalteparin -methotrexate -metronidazole -pyrimethamine -some other chemotherapy drugs like busulfan, cisplatin, estramustine, vinblastine -trimethoprim -trimetrexate -vaccines Talk to your doctor or health care professional before taking any of these medicines: -acetaminophen -aspirin -ibuprofen -ketoprofen -naproxen This list may not describe all possible interactions. Give your health care provider a list of all the medicines, herbs, non-prescription drugs, or dietary supplements you use. Also tell them if you smoke, drink alcohol, or use illegal drugs. Some items may interact with your medicine. What should I watch for while using this medicine? Visit your doctor for checks on your progress. This drug may make you feel generally unwell. This is not uncommon, as chemotherapy can affect healthy cells as well as cancer cells. Report any side effects. Continue your course of treatment even though you feel ill unless your doctor tells you to stop. In some cases, you may be given additional  medicines to help with side effects. Follow all directions for their use. Call your doctor or health care professional for advice if you get a fever, chills or sore throat, or other symptoms of a cold or flu. Do not treat yourself. This drug decreases your body's ability to fight infections. Try to avoid being around people who are sick. This medicine may increase your risk to bruise or bleed. Call your doctor or health care professional if you notice any unusual bleeding. Be careful brushing and flossing your teeth or using a toothpick because you may get an infection or bleed more easily. If you have any dental work done, tell your dentist you are receiving this medicine. Avoid taking products that contain aspirin, acetaminophen, ibuprofen, naproxen, or ketoprofen unless instructed by your doctor. These medicines may hide a fever. Do not become pregnant while taking this medicine. Women should inform their doctor if they wish to become pregnant or think they might be pregnant. There is a potential for serious side effects to an unborn child. Talk to your health care professional or pharmacist for more information. Do not breast-feed an infant while taking this medicine. Men should inform their doctor if they wish to father a child. This medicine may lower sperm counts. Do not treat diarrhea with over the counter  products. Contact your doctor if you have diarrhea that lasts more than 2 days or if it is severe and watery. This medicine can make you more sensitive to the sun. Keep out of the sun. If you cannot avoid being in the sun, wear protective clothing and use sunscreen. Do not use sun lamps or tanning beds/booths. What side effects may I notice from receiving this medicine? Side effects that you should report to your doctor or health care professional as soon as possible: -allergic reactions like skin rash, itching or hives, swelling of the face, lips, or tongue -low blood counts - this medicine  may decrease the number of white blood cells, red blood cells and platelets. You may be at increased risk for infections and bleeding. -signs of infection - fever or chills, cough, sore throat, pain or difficulty passing urine -signs of decreased platelets or bleeding - bruising, pinpoint red spots on the skin, black, tarry stools, blood in the urine -signs of decreased red blood cells - unusually weak or tired, fainting spells, lightheadedness -breathing problems -changes in vision -chest pain -mouth sores -nausea and vomiting -pain, swelling, redness at site where injected -pain, tingling, numbness in the hands or feet -redness, swelling, or sores on hands or feet -stomach pain -unusual bleeding Side effects that usually do not require medical attention (report to your doctor or health care professional if they continue or are bothersome): -changes in finger or toe nails -diarrhea -dry or itchy skin -hair loss -headache -loss of appetite -sensitivity of eyes to the light -stomach upset -unusually teary eyes This list may not describe all possible side effects. Call your doctor for medical advice about side effects. You may report side effects to FDA at 1-800-FDA-1088. Where should I keep my medicine? This drug is given in a hospital or clinic and will not be stored at home. NOTE: This sheet is a summary. It may not cover all possible information. If you have questions about this medicine, talk to your doctor, pharmacist, or health care provider.  2018 [employer]/Gold Standard (2007-12-11 13:53:16)

## 2018-02-07 ENCOUNTER — Inpatient Hospital Stay: Payer: Medicare Other

## 2018-02-07 VITALS — BP 125/70 | HR 72 | Temp 98.1°F | Resp 18

## 2018-02-07 DIAGNOSIS — Z5111 Encounter for antineoplastic chemotherapy: Secondary | ICD-10-CM | POA: Diagnosis not present

## 2018-02-07 DIAGNOSIS — Z7189 Other specified counseling: Secondary | ICD-10-CM

## 2018-02-07 DIAGNOSIS — F17211 Nicotine dependence, cigarettes, in remission: Secondary | ICD-10-CM | POA: Diagnosis not present

## 2018-02-07 DIAGNOSIS — C258 Malignant neoplasm of overlapping sites of pancreas: Secondary | ICD-10-CM | POA: Diagnosis not present

## 2018-02-07 DIAGNOSIS — C787 Secondary malignant neoplasm of liver and intrahepatic bile duct: Secondary | ICD-10-CM | POA: Diagnosis not present

## 2018-02-07 DIAGNOSIS — R630 Anorexia: Secondary | ICD-10-CM | POA: Diagnosis not present

## 2018-02-07 DIAGNOSIS — K529 Noninfective gastroenteritis and colitis, unspecified: Secondary | ICD-10-CM | POA: Diagnosis not present

## 2018-02-07 DIAGNOSIS — C259 Malignant neoplasm of pancreas, unspecified: Secondary | ICD-10-CM

## 2018-02-07 MED ORDER — HEPARIN SOD (PORK) LOCK FLUSH 100 UNIT/ML IV SOLN
500.0000 [IU] | Freq: Once | INTRAVENOUS | Status: AC | PRN
  Administered 2018-02-07: 500 [IU]
  Filled 2018-02-07: qty 5

## 2018-02-07 MED ORDER — SODIUM CHLORIDE 0.9% FLUSH
10.0000 mL | INTRAVENOUS | Status: DC | PRN
  Administered 2018-02-07: 10 mL
  Filled 2018-02-07: qty 10

## 2018-02-13 NOTE — Progress Notes (Signed)
[place]  Telephone:(336) [phone] Fax:(336) [phone]  Clinic Follow up Note   Patient Care Team: Wilford Corner, MD as PCP - General (Gastroenterology) Truitt Merle, MD as Consulting Physician (Hematology) 02/14/2018  SUMMARY OF ONCOLOGIC HISTORY:   Pancreatic cancer metastasized to liver ([place])   06/21/2017 Imaging    CT CAP IMPRESSION: 1. Indistinct low-attenuation mass in the uncinate process of the pancreas with dilatation of remainder of the pancreatic duct and some encasement of the SMA. These findings are highly indicative of pancreatic carcinoma. Recommend MRI to assess further. 2. Multiple low-attenuation lesions throughout the right and left lobes of liver most consistent with liver metastasis again MRI may be helpful. 3. Moderate change of abdominal aortic atherosclerosis. 4. Changes of centrilobular and paraseptal emphysema on CT of the chest. No lung lesion is seen.       07/06/2017 Initial Biopsy    Diagnosis Liver, needle/core biopsy, Right lobe - METASTATIC ADENOCARCINOMA.      07/10/2017 Initial Diagnosis    Pancreatic cancer metastasized to liver ([place])      07/25/2017 -  Chemotherapy    Gemcitabine and Abraxane 3 weeks on and 1 week off starting 07/25/17       10/02/2017 Imaging    CT CAP W Contrast 10/02/17 IMPRESSION: 1. Reduced size of the primary pancreatic neoplasm, which still continues to encase the superior mesenteric artery and occlude the superior mesenteric vein. The numerous metastatic liver lesions show some improvement on portal venous phase images, with increase in the central necrosis and slight reduction in size. However, the arterial phase images, which are present today but were not available on the prior exam, demonstrate that there is a significant peripheral rind around each of these lesions and that the portal venous phase images underestimate total lesions size. No findings of metastatic disease to the  chest or skeleton. 2. Severe and worsened cachexia. 3. Low-grade mesenteric edema and a small amount of pelvic ascites. 4. Aortic Atherosclerosis (ICD10-I70.0) and Emphysema (ICD10-J43.9). 5.  Prominent stool throughout the colon favors constipation.      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  The Hereditary Gene Panel offered by [facility] includes sequencing and/or deletion duplication testing of the following 47 genes: APC, ATM, AXIN2, BARD1, BMPR1A, BRCA1, BRCA2, BRIP1, CDH1, CDK4, CDKN2A (p14ARF), CDKN2A (p16INK4a), CHEK2, CTNNA1, DICER1, EPCAM (Deletion/duplication testing only), GREM1 (promoter region deletion/duplication testing only), KIT, MEN1, MLH1, MSH2, MSH3, MSH6, MUTYH, NBN, NF1, NHTL1, PALB2, PDGFRA, PMS2, POLD1, POLE, PTEN, RAD50, RAD51C, RAD51D, SDHB, SDHC, SDHD, SMAD4, SMARCA4. STK11, TP53, TSC1, TSC2, and VHL.  The following genes were evaluated for sequence changes only: SDHA and HOXB13 c.251G>A variant only. The report date is November 27, 2017.       01/08/2018 Imaging    CT CAP W Contrast 01/08/18  IMPRESSION: 1. Overall, there appears to be slight progression of disease, as evidenced by slight growth of the primary pancreatic lesion. While some of the previously noted hepatic lesions appear to have resolved or gotten smaller, others appear to be new or are slightly larger than the prior examination, as detailed above. 2. Several unusual appearing pulmonary nodules scattered throughout the lungs bilaterally, some of which are solid while others are more subsolid in appearance. Although these could be infectious or inflammatory in etiology, the possibility of developing metastatic disease in the lungs should be considered, and close attention on follow-up studies is recommended. 3. Aortic  atherosclerosis. 4. Additional incidental findings, as above. Aortic Atherosclerosis  (ICD10-I70.0).       02/04/2018 -  Chemotherapy    The patient had leucovorin 544 mg in dextrose 5 % 250 mL infusion, 400 mg/m2 = 544 mg, Intravenous,  Once, 1 of 4 cycles Administration: 544 mg (02/05/2018) fluorouracil (ADRUCIL) 3,250 mg in sodium chloride 0.9 % 85 mL chemo infusion, 2,400 mg/m2 = 3,250 mg, Intravenous, 1 Day/Dose, 1 of 4 cycles Administration: 3,250 mg (02/05/2018) irinotecan LIPOSOME (ONIVYDE) 95 mg in sodium chloride 0.9 % 500 mL chemo infusion, 94.6 mg, Intravenous, Once, 1 of 4 cycles Administration: 95 mg (02/05/2018)  for chemotherapy treatment.      PRIOR THERAPY: first lineGemcitabine and Abraxane 2 weeks on and 1 week off started 07/25/17 - 01/22/2018 s/p 9 cycles; discontinued due to slight disease progression   CURRENT THERAPY: 5-fu pump and liposomal irinotecanevery 2 weeks started 02/05/18  INTERVAL HISTORY: Tammy Adams returns for follow up as scheduled. She began liposomal-irinotecan/5FU on 6/18. She tolerated well. She has 2 episodes diarrhea per day, which is decreased from her baseline. Takes 1 imodium before meals and imodium with lomotil after loose/watery stool. Appetite is low which is also her baseline, she notes she has never had a strong appetite. Takes remeron 1 tab qHS. Drinks 2 boost/day. Denies nausea or vomiting. No mucositis. She spent 5 hours outside yesterday and feels week and tired, otherwise has not had a problem with fatigue. She notes mild intermittent tingling to right foot and right hand 4th and 5th fingers. Not limiting function.   REVIEW OF SYSTEMS:   Constitutional: Denies fevers, chills or abnormal weight loss (+) fatigue Eyes: Denies blurriness of vision Ears, nose, mouth, throat, and face: Denies mucositis or sore throat Respiratory: Denies dyspnea or wheezes (+) chronic cough  Cardiovascular: Denies palpitation, chest discomfort or lower extremity swelling Gastrointestinal:  Denies nausea, vomiting, constipation, heartburn or  change in bowel habits (+) diarrhea 2/day  Skin: Denies abnormal skin rashes Lymphatics: Denies new lymphadenopathy or easy bruising Neurological:Denies numbness or new weaknesses (+) intermittent tingling to right foot, right hand 4/5th fingers Behavioral/Psych: Mood is stable, no new changes  All other systems were reviewed with the patient and are negative.  MEDICAL HISTORY:  Past Medical History:  Diagnosis Date  . Anxiety   . Family history of colon cancer   . Family history of pancreatic cancer   . Family history of prostate cancer     SURGICAL HISTORY: Past Surgical History:  Procedure Laterality Date  . IR FLUORO GUIDE PORT INSERTION RIGHT  07/24/2017  . IR US GUIDE VASC ACCESS RIGHT  07/24/2017    I have reviewed the social history and family history with the patient and they are unchanged from previous note.  ALLERGIES:  has No Known Allergies.  MEDICATIONS:  Current Outpatient Medications  Medication Sig Dispense Refill  . diphenoxylate-atropine (LOMOTIL) 2.5-0.025 MG tablet TAKE 1 TABLET BY MOUTH 4 TIMES DAILY AS NEEDED FOR  DIARRHEA  OR  LOOSE  STOOLS 30 tablet 2  . LORazepam (ATIVAN) 1 MG tablet Take 1 mg 2 (two) times daily by mouth.    . mirtazapine (REMERON) 15 MG tablet Take 2 tablets (30 mg total) by mouth at bedtime. 30 tablet 3  . Multiple Vitamin (MULTIVITAMIN WITH MINERALS) TABS tablet Take 1 tablet daily by mouth. One-A-Day for Women    . potassium chloride SA (K-DUR,KLOR-CON) 20 MEQ tablet Take 1 tablet (20 mEq total) by mouth daily. 40 tablet 1  No current facility-administered medications for this visit.     PHYSICAL EXAMINATION: ECOG PERFORMANCE STATUS: 1-2  Vitals:   02/14/18 0836  BP: 101/65  Pulse: 70  Resp: 18  Temp: 97.7 F (36.5 C)   Filed Weights   02/14/18 0836  Weight: 86 lb (39 kg)    GENERAL:alert, no distress and comfortable SKIN: no rashes or significant lesions EYES: normal, Conjunctiva are pink and non-injected, sclera  clear OROPHARYNX:no thrush or ulcers  LYMPH:  no palpable cervical or supraclavicular lymphadenopathy  LUNGS: clear to auscultation with normal breathing effort HEART: regular rate & rhythm and no murmurs and no lower extremity edema ABDOMEN:abdomen soft, non-tender and normal bowel sounds Musculoskeletal:no cyanosis of digits and no clubbing  NEURO: alert & oriented x 3 with fluent speech, no focal motor/sensory deficits PAC without erythema   LABORATORY DATA:  I have reviewed the data as listed CBC Latest Ref Rng & Units 02/14/2018 02/05/2018 01/22/2018  WBC 3.9 - 10.3 K/uL 6.9 7.8 3.5(L)  Hemoglobin 11.6 - 15.9 g/dL 10.4(L) 10.4(L) 9.2(L)  Hematocrit 34.8 - 46.6 % 31.2(L) 31.2(L) 28.3(L)  Platelets 145 - 400 K/uL 302 301 194     CMP Latest Ref Rng & Units 02/14/2018 02/05/2018 01/22/2018  Glucose 70 - 99 mg/dL 94 100 71  BUN 8 - 23 mg/dL _0 Creatinine 0.44 - 1.00 mg/dL 0.89 0.88 1.23(H)  Sodium 135 - 145 mmol/L 139 141 140  Potassium 3.5 - 5.1 mmol/L 3.8 3.8 4.2  Chloride 98 - 111 mmol/L 109 112(H) 113(H)  CO2 22 - 32 mmol/L 23 22 21(L)  Calcium 8.9 - 10.3 mg/dL 9.3 9.2 8.9  Total Protein 6.5 - 8.1 g/dL 7.5 7.4 7.0  Total Bilirubin 0.3 - 1.2 mg/dL 0.3 <0.2(L) <0.2(L)  Alkaline Phos 38 - 126 U/L 78 104 99  AST 15 - 41 U/L _1 ALT 0 - 44 U/L _2 PATHOLOGY: #1 biopsy from duodenum, distal stomach, GE junction no significant pathological changes, except reactive changes in stomach #2 transverse colon, hepatic flexure, sigmoid, polyps biopsy showed tubular adenomas (3)  Liver Biopsy  Diagnosis 07/06/17 Liver, needle/core biopsy, Right lobe - METASTATIC ADENOCARCINOMA. Microscopic Comment The tumor is positive for cytokeratin 7 and negative for cytokeratin 20, CDX-2 and TTF-1. The immunoprofile is non-specific, but the differential includes a upper GI or pancreaticobiliary primary among others. Dr. Lyndon Code has reviewed the case. Dr. Michail Sermon was paged on  07/10/2017.    RADIOGRAPHIC STUDIES: I have personally reviewed the radiological images as listed and agreed with the findings in the report. No results found.   ASSESSMENT & PLAN: 67 y.o.African-American female, with past medical history of anxiety, otherwise healthy, presented with chronic diarrhea for 4 months, poor appetite and 35lbs weight loss. CT scan showed a pancreatic head mass and multiple liver metastasis.  1. 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 Diarrhea 3. Anorexia and weight loss 4. Smoking cessation - quitting with nicotine patch, down to 7 cigarettes per day on 6/18  5. Goal of care discussion  6. 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 Deignan appears stable. She completed cycle 1 liposomal-irinotecan/5FU on 02/05/18. She tolerated treatment well with diarrhea that appears controlled and improved relative to her baseline. She maximizes imodium and lomotil. She overexerted herself outside yesterday and appears fatigued. Given BP on low end, I will support with IVF today. I encouraged her to increase po oral hydration and avoid peak sun  hours outside. She lost 1 pound and has persistently low appetite. I suggest she increase boost to 3-4 per day and increase remeron to 30 mg (2 tabs) qHS. I will send in new Rx with 30 mg tabs. Labs reviewed. Will see her next week with cycle 2.   PLAN: -Labs reviewed -1 L NS over 2 hours today -Increase boost 3-4 per day -Increase Remeron to 30 mg qHS, refilled  -f/u in 1 week with cycle 2  All questions were answered. The patient knows to call the clinic with any problems, questions or concerns. No barriers to learning was detected.     Alla Feeling, NP 02/14/18

## 2018-02-14 ENCOUNTER — Encounter: Payer: Self-pay | Admitting: Nurse Practitioner

## 2018-02-14 ENCOUNTER — Inpatient Hospital Stay: Payer: Medicare Other

## 2018-02-14 ENCOUNTER — Inpatient Hospital Stay (HOSPITAL_BASED_OUTPATIENT_CLINIC_OR_DEPARTMENT_OTHER): Payer: Medicare Other | Admitting: Nurse Practitioner

## 2018-02-14 ENCOUNTER — Telehealth: Payer: Self-pay

## 2018-02-14 VITALS — BP 101/65 | HR 70 | Temp 97.7°F | Resp 18 | Ht 64.0 in | Wt 86.0 lb

## 2018-02-14 DIAGNOSIS — K529 Noninfective gastroenteritis and colitis, unspecified: Secondary | ICD-10-CM | POA: Diagnosis not present

## 2018-02-14 DIAGNOSIS — C259 Malignant neoplasm of pancreas, unspecified: Secondary | ICD-10-CM

## 2018-02-14 DIAGNOSIS — R630 Anorexia: Secondary | ICD-10-CM | POA: Diagnosis not present

## 2018-02-14 DIAGNOSIS — Z9221 Personal history of antineoplastic chemotherapy: Secondary | ICD-10-CM | POA: Diagnosis not present

## 2018-02-14 DIAGNOSIS — C258 Malignant neoplasm of overlapping sites of pancreas: Secondary | ICD-10-CM | POA: Diagnosis not present

## 2018-02-14 DIAGNOSIS — F17211 Nicotine dependence, cigarettes, in remission: Secondary | ICD-10-CM

## 2018-02-14 DIAGNOSIS — R5383 Other fatigue: Secondary | ICD-10-CM

## 2018-02-14 DIAGNOSIS — I959 Hypotension, unspecified: Secondary | ICD-10-CM

## 2018-02-14 DIAGNOSIS — C787 Secondary malignant neoplasm of liver and intrahepatic bile duct: Secondary | ICD-10-CM

## 2018-02-14 DIAGNOSIS — G629 Polyneuropathy, unspecified: Secondary | ICD-10-CM | POA: Diagnosis not present

## 2018-02-14 DIAGNOSIS — Z5111 Encounter for antineoplastic chemotherapy: Secondary | ICD-10-CM | POA: Diagnosis not present

## 2018-02-14 DIAGNOSIS — R197 Diarrhea, unspecified: Secondary | ICD-10-CM

## 2018-02-14 LAB — COMPREHENSIVE METABOLIC PANEL
ALK PHOS: 78 U/L (ref 38–126)
ALT: 21 U/L (ref 0–44)
ANION GAP: 7 (ref 5–15)
AST: 24 U/L (ref 15–41)
Albumin: 4 g/dL (ref 3.5–5.0)
BUN: 19 mg/dL (ref 8–23)
CALCIUM: 9.3 mg/dL (ref 8.9–10.3)
CHLORIDE: 109 mmol/L (ref 98–111)
CO2: 23 mmol/L (ref 22–32)
Creatinine, Ser: 0.89 mg/dL (ref 0.44–1.00)
GFR calc Af Amer: >60 mL/min (ref >60)
GFR calc non Af Amer: >60 mL/min (ref >60)
GLUCOSE: 94 mg/dL (ref 70–99)
POTASSIUM: 3.8 mmol/L (ref 3.5–5.1)
SODIUM: 139 mmol/L (ref 135–145)
Total Bilirubin: 0.3 mg/dL (ref 0.3–1.2)
Total Protein: 7.5 g/dL (ref 6.5–8.1)

## 2018-02-14 LAB — CBC WITH DIFFERENTIAL/PLATELET
BASOS PCT: 0 %
Basophils Absolute: 0 10*3/uL (ref 0.0–0.1)
Eosinophils Absolute: 0.2 10*3/uL (ref 0.0–0.5)
Eosinophils Relative: 3 %
HCT: 31.2 % — ABNORMAL LOW (ref 34.8–46.6)
HEMOGLOBIN: 10.4 g/dL — AB (ref 11.6–15.9)
Lymphocytes Relative: 21 %
Lymphs Abs: 1.5 10*3/uL (ref 0.9–3.3)
MCH: 35.6 pg — ABNORMAL HIGH (ref 25.1–34.0)
MCHC: 33.3 g/dL (ref 31.5–36.0)
MCV: 106.8 fL — ABNORMAL HIGH (ref 79.5–101.0)
MONOS PCT: 5 %
Monocytes Absolute: 0.3 10*3/uL (ref 0.1–0.9)
NEUTROS PCT: 71 %
Neutro Abs: 4.9 10*3/uL (ref 1.5–6.5)
Platelets: 302 10*3/uL (ref 145–400)
RBC: 2.92 MIL/uL — ABNORMAL LOW (ref 3.70–5.45)
RDW: 14.5 % (ref 11.2–14.5)
WBC: 6.9 10*3/uL (ref 3.9–10.3)

## 2018-02-14 MED ORDER — MIRTAZAPINE 15 MG PO TABS: 30.0000 mg | ORAL_TABLET | Freq: Every day | ORAL | 3 refills | Status: DC

## 2018-02-14 MED ORDER — HEPARIN SOD (PORK) LOCK FLUSH 100 UNIT/ML IV SOLN
250.0000 [IU] | Freq: Once | INTRAVENOUS | Status: AC
  Administered 2018-02-14: 500 [IU]
  Filled 2018-02-14: qty 5

## 2018-02-14 MED ORDER — SODIUM CHLORIDE 0.9% FLUSH
10.0000 mL | Freq: Once | INTRAVENOUS | Status: AC
  Administered 2018-02-14: 10 mL
  Filled 2018-02-14: qty 10

## 2018-02-14 MED ORDER — SODIUM CHLORIDE 0.9 % IV SOLN
Freq: Once | INTRAVENOUS | Status: AC
  Administered 2018-02-14: 10:00:00 via INTRAVENOUS

## 2018-02-14 NOTE — Telephone Encounter (Signed)
Per 6/27 los to move appointments due to 4th holiday. called Lacie and explained to her that the patient would be able to receive her service for pump start and stop  On this day due to [facility] infusion will be open on that day. Printed avs and calender of upcoming appointment. Per 6/27 los

## 2018-02-14 NOTE — Patient Instructions (Signed)
Dehydration, Adult Dehydration is when there is not enough fluid or water in your body. This happens when you lose more fluids than you take in. Dehydration can range from mild to very bad. It should be treated right away to keep it from getting very bad. Symptoms of mild dehydration may include:  Thirst.  Dry lips.  Slightly dry mouth.  Dry, warm skin.  Dizziness. Symptoms of moderate dehydration may include:  Very dry mouth.  Muscle cramps.  Dark pee (urine). Pee may be the color of tea.  Your body making less pee.  Your eyes making fewer tears.  Heartbeat that is uneven or faster than normal (palpitations).  Headache.  Light-headedness, especially when you stand up from sitting.  Fainting (syncope). Symptoms of very bad dehydration may include:  Changes in skin, such as: ? Cold and clammy skin. ? Blotchy (mottled) or pale skin. ? Skin that does not quickly return to normal after being lightly pinched and let go (poor skin turgor).  Changes in body fluids, such as: ? Feeling very thirsty. ? Your eyes making fewer tears. ? Not sweating when body temperature is high, such as in hot weather. ? Your body making very little pee.  Changes in vital signs, such as: ? Weak pulse. ? Pulse that is more than 100 beats a minute when you are sitting still. ? Fast breathing. ? Low blood pressure.  Other changes, such as: ? Sunken eyes. ? Cold hands and feet. ? Confusion. ? Lack of energy (lethargy). ? Trouble waking up from sleep. ? Short-term weight loss. ? Unconsciousness. Follow these instructions at home:  If told by your doctor, drink an ORS: ? Make an ORS by using instructions on the package. ? Start by drinking small amounts, about  cup (120 mL) every 5-10 minutes. ? Slowly drink more until you have had the amount that your doctor said to have.  Drink enough clear fluid to keep your pee clear or pale yellow. If you were told to drink an ORS, finish the ORS  first, then start slowly drinking clear fluids. Drink fluids such as: ? Water. Do not drink only water by itself. Doing that can make the salt (sodium) level in your body get too low (hyponatremia). ? Ice chips. ? Fruit juice that you have added water to (diluted). ? Low-calorie sports drinks.  Avoid: ? Alcohol. ? Drinks that have a lot of sugar. These include high-calorie sports drinks, fruit juice that does not have water added, and soda. ? Caffeine. ? Foods that are greasy or have a lot of fat or sugar.  Take over-the-counter and prescription medicines only as told by your doctor.  Do not take salt tablets. Doing that can make the salt level in your body get too high (hypernatremia).  Eat foods that have minerals (electrolytes). Examples include bananas, oranges, potatoes, tomatoes, and spinach.  Keep all follow-up visits as told by your doctor. This is important. Contact a doctor if:  You have belly (abdominal) pain that: ? Gets worse. ? Stays in one area (localizes).  You have a rash.  You have a stiff neck.  You get angry or annoyed more easily than normal (irritability).  You are more sleepy than normal.  You have a harder time waking up than normal.  You feel: ? Weak. ? Dizzy. ? Very thirsty.  You have peed (urinated) only a small amount of very dark pee during 6-8 hours. Get help right away if:  You have symptoms of   very bad dehydration.  You cannot drink fluids without throwing up (vomiting).  Your symptoms get worse with treatment.  You have a fever.  You have a very bad headache.  You are throwing up or having watery poop (diarrhea) and it: ? Gets worse. ? Does not go away.  You have blood or something green (bile) in your throw-up.  You have blood in your poop (stool). This may cause poop to look black and tarry.  You have not peed in 6-8 hours.  You pass out (faint).  Your heart rate when you are sitting still is more than 100 beats a  minute.  You have trouble breathing. This information is not intended to replace advice given to you by your health care provider. Make sure you discuss any questions you have with your health care provider. Document Released: 06/03/2009 Document Revised: 02/25/2016 Document Reviewed: 10/01/2015 Elsevier Interactive Patient Education  2018 [employer]  

## 2018-02-18 NOTE — Progress Notes (Signed)
[place]  Telephone:(336) [phone] Fax:(336) [phone]  Clinic Follow up Note   Patient Care Team: Wilford Corner, MD as PCP - General (Gastroenterology) Truitt Merle, MD as Consulting Physician (Hematology) 02/19/2018  SUMMARY OF ONCOLOGIC HISTORY:   Pancreatic cancer metastasized to liver ([place])   06/21/2017 Imaging    CT CAP IMPRESSION: 1. Indistinct low-attenuation mass in the uncinate process of the pancreas with dilatation of remainder of the pancreatic duct and some encasement of the SMA. These findings are highly indicative of pancreatic carcinoma. Recommend MRI to assess further. 2. Multiple low-attenuation lesions throughout the right and left lobes of liver most consistent with liver metastasis again MRI may be helpful. 3. Moderate change of abdominal aortic atherosclerosis. 4. Changes of centrilobular and paraseptal emphysema on CT of the chest. No lung lesion is seen.       07/06/2017 Initial Biopsy    Diagnosis Liver, needle/core biopsy, Right lobe - METASTATIC ADENOCARCINOMA.      07/10/2017 Initial Diagnosis    Pancreatic cancer metastasized to liver ([place])      07/25/2017 -  Chemotherapy    Gemcitabine and Abraxane 3 weeks on and 1 week off starting 07/25/17       10/02/2017 Imaging    CT CAP W Contrast 10/02/17 IMPRESSION: 1. Reduced size of the primary pancreatic neoplasm, which still continues to encase the superior mesenteric artery and occlude the superior mesenteric vein. The numerous metastatic liver lesions show some improvement on portal venous phase images, with increase in the central necrosis and slight reduction in size. However, the arterial phase images, which are present today but were not available on the prior exam, demonstrate that there is a significant peripheral rind around each of these lesions and that the portal venous phase images underestimate total lesions size. No findings of metastatic disease to the chest  or skeleton. 2. Severe and worsened cachexia. 3. Low-grade mesenteric edema and a small amount of pelvic ascites. 4. Aortic Atherosclerosis (ICD10-I70.0) and Emphysema (ICD10-J43.9). 5.  Prominent stool throughout the colon favors constipation.      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  The Hereditary Gene Panel offered by [facility] includes sequencing and/or deletion duplication testing of the following 47 genes: APC, ATM, AXIN2, BARD1, BMPR1A, BRCA1, BRCA2, BRIP1, CDH1, CDK4, CDKN2A (p14ARF), CDKN2A (p16INK4a), CHEK2, CTNNA1, DICER1, EPCAM (Deletion/duplication testing only), GREM1 (promoter region deletion/duplication testing only), KIT, MEN1, MLH1, MSH2, MSH3, MSH6, MUTYH, NBN, NF1, NHTL1, PALB2, PDGFRA, PMS2, POLD1, POLE, PTEN, RAD50, RAD51C, RAD51D, SDHB, SDHC, SDHD, SMAD4, SMARCA4. STK11, TP53, TSC1, TSC2, and VHL.  The following genes were evaluated for sequence changes only: SDHA and HOXB13 c.251G>A variant only. The report date is November 27, 2017.       01/08/2018 Imaging    CT CAP W Contrast 01/08/18  IMPRESSION: 1. Overall, there appears to be slight progression of disease, as evidenced by slight growth of the primary pancreatic lesion. While some of the previously noted hepatic lesions appear to have resolved or gotten smaller, others appear to be new or are slightly larger than the prior examination, as detailed above. 2. Several unusual appearing pulmonary nodules scattered throughout the lungs bilaterally, some of which are solid while others are more subsolid in appearance. Although these could be infectious or inflammatory in etiology, the possibility of developing metastatic disease in the lungs should be considered, and close attention on follow-up studies is recommended. 3. Aortic  atherosclerosis. 4. Additional incidental findings, as above. Aortic Atherosclerosis  (ICD10-I70.0).       02/04/2018 -  Chemotherapy    The patient had leucovorin 544 mg in dextrose 5 % 250 mL infusion, 400 mg/m2 = 544 mg, Intravenous,  Once, 1 of 4 cycles Administration: 544 mg (02/05/2018) fluorouracil (ADRUCIL) 3,250 mg in sodium chloride 0.9 % 85 mL chemo infusion, 2,400 mg/m2 = 3,250 mg, Intravenous, 1 Day/Dose, 1 of 4 cycles Administration: 3,250 mg (02/05/2018) irinotecan LIPOSOME (ONIVYDE) 95 mg in sodium chloride 0.9 % 500 mL chemo infusion, 94.6 mg, Intravenous, Once, 1 of 4 cycles Administration: 95 mg (02/05/2018)  for chemotherapy treatment.      CURRENT THERAPY:5-fu pump and liposomal irinotecanevery 2 weeksstarted 02/05/18  INTERVAL HISTORY: Tammy Adams returns for follow up and cycle 2 liposomal-irinotecan/5FU as scheduled. She completed cycle 1 on 02/05/18.  She was seen by me on 02/14/2018 for toxicity check, she required 1 L IVF support for fatigue, mild diarrhea, and weight loss.  On that day I increased Remeron to 30 mg nightly.  Over the last week she has increased appetite and p.o. intake, she has gained 3 pounds.  She continues to have 2-4 episodes of diarrhea per day pending on her diet, controlled with Lomotil before meals and Imodium.  Denies nausea, vomiting, abdominal pain, or blood in stool.  Chronic cough is unchanged; she has clear rhinorrhea when she works outside.  No fever, chills, chest pain, or dyspnea.  She has tingling intermittently in her toes at night, no pain or sensitivity.  REVIEW OF SYSTEMS:   Constitutional: Denies fatigue, fevers, chills or abnormal weight loss (+) increased appetite (+) weight gain Eyes: Denies blurriness of vision Ears, nose, mouth, throat, and face: Denies mucositis or sore throat (+) clear rhinorrhea when working outside Respiratory: Denies dyspnea or wheezes (+) chronic cough at baseline Cardiovascular: Denies palpitation, chest discomfort or lower extremity swelling Gastrointestinal:  Denies nausea, vomiting,  constipation, abdominal pain, hematochezia, heartburn or change in bowel habits (+) diarrhea at baseline, 2-4 episodes per day Skin: Denies abnormal skin rashes Lymphatics: Denies new lymphadenopathy or easy bruising Neurological:Denies numbness or new weaknesses (+) intermittent tingling to toes at night Behavioral/Psych: Mood is stable, no new changes (+) history of anxiety and depression All other systems were reviewed with the patient and are negative.  MEDICAL HISTORY:  Past Medical History:  Diagnosis Date  . Anxiety   . Family history of colon cancer   . Family history of pancreatic cancer   . Family history of prostate cancer     SURGICAL HISTORY: Past Surgical History:  Procedure Laterality Date  . IR FLUORO GUIDE PORT INSERTION RIGHT  07/24/2017  . IR US GUIDE VASC ACCESS RIGHT  07/24/2017    I have reviewed the social history and family history with the patient and they are unchanged from previous note.  ALLERGIES:  has No Known Allergies.  MEDICATIONS:  Current Outpatient Medications  Medication Sig Dispense Refill  . diphenoxylate-atropine (LOMOTIL) 2.5-0.025 MG tablet TAKE 1 TABLET BY MOUTH 4 TIMES DAILY AS NEEDED FOR  DIARRHEA  OR  LOOSE  STOOLS 30 tablet 2  . LORazepam (ATIVAN) 1 MG tablet Take 1 mg 2 (two) times daily by mouth.    . mirtazapine (REMERON) 15 MG tablet Take 2 tablets (30 mg total) by mouth at bedtime. 30 tablet 3  . Multiple Vitamin (MULTIVITAMIN WITH MINERALS) TABS tablet Take 1 tablet daily by mouth. One-A-Day for Women    . potassium  chloride SA (K-DUR,KLOR-CON) 20 MEQ tablet Take 1 tablet (20 mEq total) by mouth daily. 40 tablet 1   No current facility-administered medications for this visit.     PHYSICAL EXAMINATION: ECOG PERFORMANCE STATUS: 1 - Symptomatic but completely ambulatory  Vitals:   02/19/18 0834  BP: (!) 100/55  Pulse: 61  Resp: 17  Temp: 97.8 F (36.6 C)  SpO2: 100%   Filed Weights   02/19/18 0834  Weight: 89 lb 1.6  oz (40.4 kg)    GENERAL:alert, no distress and comfortable SKIN: no rashes or significant lesions EYES: normal, Conjunctiva are pink and non-injected, sclera clear OROPHARYNX:no thrush or ulcers LYMPH:  no palpable cervical or supraclavicular lymphadenopathy  LUNGS: clear to auscultation with normal breathing effort HEART: regular rate & rhythm and no murmurs and no lower extremity edema ABDOMEN:abdomen soft, non-tender and normal bowel sounds Musculoskeletal:no cyanosis of digits and no clubbing  NEURO: alert & oriented x 3 with fluent speech, no focal motor/sensory deficits PAC without erythema  LABORATORY DATA:  I have reviewed the data as listed CBC Latest Ref Rng & Units 02/19/2018 02/14/2018 02/05/2018  WBC 3.9 - 10.3 K/uL 8.0 6.9 7.8  Hemoglobin 11.6 - 15.9 g/dL 10.5(L) 10.4(L) 10.4(L)  Hematocrit 34.8 - 46.6 % 31.8(L) 31.2(L) 31.2(L)  Platelets 145 - 400 K/uL 179 302 301     CMP Latest Ref Rng & Units 02/14/2018 02/05/2018 01/22/2018  Glucose 70 - 99 mg/dL 94 100 71  BUN 8 - 23 mg/dL '19 10 12  ' Creatinine 0.44 - 1.00 mg/dL 0.89 0.88 1.23(H)  Sodium 135 - 145 mmol/L 139 141 140  Potassium 3.5 - 5.1 mmol/L 3.8 3.8 4.2  Chloride 98 - 111 mmol/L 109 112(H) 113(H)  CO2 22 - 32 mmol/L 23 22 21(L)  Calcium 8.9 - 10.3 mg/dL 9.3 9.2 8.9  Total Protein 6.5 - 8.1 g/dL 7.5 7.4 7.0  Total Bilirubin 0.3 - 1.2 mg/dL 0.3 <0.2(L) <0.2(L)  Alkaline Phos 38 - 126 U/L 78 104 99  AST 15 - 41 U/L '24 17 19  ' ALT 0 - 44 U/L '21 12 12   ' PATHOLOGY: #1 biopsy from duodenum, distal stomach, GE junction no significant pathological changes, except reactive changes in stomach #2 transverse colon, hepatic flexure, sigmoid, polyps biopsy showed tubular adenomas (3)  Liver Biopsy  Diagnosis 07/06/17 Liver, needle/core biopsy, Right lobe - METASTATIC ADENOCARCINOMA. Microscopic Comment The tumor is positive for cytokeratin 7 and negative for cytokeratin 20, CDX-2 and TTF-1. The immunoprofile  is non-specific, but the differential includes a upper GI or pancreaticobiliary primary among others. Dr. Lyndon Code has reviewed the case. Dr. Michail Sermon was paged on 07/10/2017.   RADIOGRAPHIC STUDIES: I have personally reviewed the radiological images as listed and agreed with the findings in the report. No results found.   ASSESSMENT & PLAN: 67 y.o.African-American female, with past medical history of anxiety, otherwise healthy, presented with chronic diarrhea for 4 months, poor appetite and 35lbs weight loss. CT scan showed a pancreatic head mass and multiple liver metastasis.  1. 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 Diarrhea, controlled with Lomotil before meals and Imodium as needed 3. Anorexia and weight loss, appetite improved on Remeron 30 mg nightly (increased on 02/14/2018) 4. Smoking cessation- quitting with nicotine patch, down to 7 cigarettes per day on 6/18 5. Goal of care discussion  6. 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 Maston appears stable.  She completed cycle  1 liposomal Irinotecan/5-FU; tolerated well overall.  Appetite and fatigue have improved, she gained 3 pounds.  Labs reviewed, CBC adequate for treatment; CMP pending.  For grade 1 neuropathy secondary to chemotherapy I recommend adding B complex vitamin.  Recommend she proceed with cycle 2 liposomal Irinotecan/5-FU at current dose.  Return in 2 weeks for follow-up and cycle 3.  PLAN: -Labs reviewed, proceed with cycle 2 liposomal-irinotecan/5FU at current dose -Begin B complex vitamin for neuropathy -F/u in 2 weeks with cycle 3  All questions were answered. The patient knows to call the clinic with any problems, questions or concerns. No barriers to learning was detected.     Alla Feeling, NP 02/19/18

## 2018-02-19 ENCOUNTER — Telehealth: Payer: Self-pay | Admitting: Hematology

## 2018-02-19 ENCOUNTER — Inpatient Hospital Stay: Payer: Medicare Other | Attending: Hematology | Admitting: Nurse Practitioner

## 2018-02-19 ENCOUNTER — Inpatient Hospital Stay: Payer: Medicare Other | Admitting: Nutrition

## 2018-02-19 ENCOUNTER — Inpatient Hospital Stay: Payer: Medicare Other

## 2018-02-19 ENCOUNTER — Encounter: Payer: Self-pay | Admitting: Nurse Practitioner

## 2018-02-19 VITALS — BP 100/55 | HR 61 | Temp 97.8°F | Resp 17 | Ht 64.0 in | Wt 89.1 lb

## 2018-02-19 DIAGNOSIS — C787 Secondary malignant neoplasm of liver and intrahepatic bile duct: Principal | ICD-10-CM

## 2018-02-19 DIAGNOSIS — F17210 Nicotine dependence, cigarettes, uncomplicated: Secondary | ICD-10-CM | POA: Diagnosis not present

## 2018-02-19 DIAGNOSIS — Z800 Family history of malignant neoplasm of digestive organs: Secondary | ICD-10-CM

## 2018-02-19 DIAGNOSIS — C259 Malignant neoplasm of pancreas, unspecified: Secondary | ICD-10-CM

## 2018-02-19 DIAGNOSIS — Z7189 Other specified counseling: Secondary | ICD-10-CM

## 2018-02-19 DIAGNOSIS — R197 Diarrhea, unspecified: Secondary | ICD-10-CM | POA: Diagnosis not present

## 2018-02-19 DIAGNOSIS — Z5111 Encounter for antineoplastic chemotherapy: Secondary | ICD-10-CM | POA: Insufficient documentation

## 2018-02-19 DIAGNOSIS — C258 Malignant neoplasm of overlapping sites of pancreas: Secondary | ICD-10-CM | POA: Insufficient documentation

## 2018-02-19 DIAGNOSIS — F419 Anxiety disorder, unspecified: Secondary | ICD-10-CM | POA: Insufficient documentation

## 2018-02-19 DIAGNOSIS — R630 Anorexia: Secondary | ICD-10-CM

## 2018-02-19 DIAGNOSIS — G620 Drug-induced polyneuropathy: Secondary | ICD-10-CM | POA: Insufficient documentation

## 2018-02-19 DIAGNOSIS — Z8042 Family history of malignant neoplasm of prostate: Secondary | ICD-10-CM

## 2018-02-19 DIAGNOSIS — E876 Hypokalemia: Secondary | ICD-10-CM | POA: Diagnosis not present

## 2018-02-19 DIAGNOSIS — Z79899 Other long term (current) drug therapy: Secondary | ICD-10-CM | POA: Diagnosis not present

## 2018-02-19 DIAGNOSIS — T451X5S Adverse effect of antineoplastic and immunosuppressive drugs, sequela: Secondary | ICD-10-CM | POA: Insufficient documentation

## 2018-02-19 LAB — CBC WITH DIFFERENTIAL/PLATELET
BASOS ABS: 0.1 10*3/uL (ref 0.0–0.1)
Basophils Relative: 1 %
EOS ABS: 0.4 10*3/uL (ref 0.0–0.5)
EOS PCT: 5 %
HCT: 31.8 % — ABNORMAL LOW (ref 34.8–46.6)
HEMOGLOBIN: 10.5 g/dL — AB (ref 11.6–15.9)
Lymphocytes Relative: 16 %
Lymphs Abs: 1.3 10*3/uL (ref 0.9–3.3)
MCH: 35.8 pg — ABNORMAL HIGH (ref 25.1–34.0)
MCHC: 33.1 g/dL (ref 31.5–36.0)
MCV: 108.3 fL — ABNORMAL HIGH (ref 79.5–101.0)
Monocytes Absolute: 0.7 10*3/uL (ref 0.1–0.9)
Monocytes Relative: 9 %
NEUTROS PCT: 69 %
Neutro Abs: 5.5 10*3/uL (ref 1.5–6.5)
PLATELETS: 179 10*3/uL (ref 145–400)
RBC: 2.94 MIL/uL — AB (ref 3.70–5.45)
RDW: 14.8 % — ABNORMAL HIGH (ref 11.2–14.5)
WBC: 8 10*3/uL (ref 3.9–10.3)

## 2018-02-19 LAB — COMPREHENSIVE METABOLIC PANEL
ALBUMIN: 3.9 g/dL (ref 3.5–5.0)
ALT: 19 U/L (ref 0–44)
ANION GAP: 6 (ref 5–15)
AST: 19 U/L (ref 15–41)
Alkaline Phosphatase: 85 U/L (ref 38–126)
BUN: 10 mg/dL (ref 8–23)
CALCIUM: 9.2 mg/dL (ref 8.9–10.3)
CHLORIDE: 112 mmol/L — AB (ref 98–111)
CO2: 21 mmol/L — AB (ref 22–32)
Creatinine, Ser: 0.88 mg/dL (ref 0.44–1.00)
GFR calc Af Amer: >60 mL/min (ref >60)
GFR calc non Af Amer: >60 mL/min (ref >60)
GLUCOSE: 88 mg/dL (ref 70–99)
POTASSIUM: 3.9 mmol/L (ref 3.5–5.1)
SODIUM: 139 mmol/L (ref 135–145)
Total Bilirubin: <0.2 mg/dL — ABNORMAL LOW (ref 0.3–1.2)
Total Protein: 7.1 g/dL (ref 6.5–8.1)

## 2018-02-19 MED ORDER — SODIUM CHLORIDE 0.9 % IV SOLN
Freq: Once | INTRAVENOUS | Status: AC
  Administered 2018-02-19: 09:00:00 via INTRAVENOUS

## 2018-02-19 MED ORDER — PROCHLORPERAZINE MALEATE 10 MG PO TABS
ORAL_TABLET | ORAL | Status: AC
  Filled 2018-02-19: qty 1

## 2018-02-19 MED ORDER — LEUCOVORIN CALCIUM INJECTION 350 MG
400.0000 mg/m2 | Freq: Once | INTRAVENOUS | Status: AC
  Administered 2018-02-19: 544 mg via INTRAVENOUS
  Filled 2018-02-19: qty 27.2

## 2018-02-19 MED ORDER — PROCHLORPERAZINE MALEATE 10 MG PO TABS
10.0000 mg | ORAL_TABLET | Freq: Once | ORAL | Status: AC
  Administered 2018-02-19: 10 mg via ORAL

## 2018-02-19 MED ORDER — SODIUM CHLORIDE 0.9 % IV SOLN
95.0000 mg | Freq: Once | INTRAVENOUS | Status: AC
  Administered 2018-02-19: 95 mg via INTRAVENOUS
  Filled 2018-02-19: qty 22.09

## 2018-02-19 MED ORDER — SODIUM CHLORIDE 0.9 % IV SOLN
2400.0000 mg/m2 | INTRAVENOUS | Status: DC
  Administered 2018-02-19: 3250 mg via INTRAVENOUS
  Filled 2018-02-19: qty 65

## 2018-02-19 MED ORDER — SODIUM CHLORIDE 0.9% FLUSH
10.0000 mL | Freq: Once | INTRAVENOUS | Status: AC
  Administered 2018-02-19: 10 mL
  Filled 2018-02-19: qty 10

## 2018-02-19 NOTE — Patient Instructions (Signed)
[facility] Discharge Instructions for Patients Receiving Chemotherapy  Today you received the following chemotherapy agents: Irinotecan liposome (Onivyde), Leucovorin, and Fluorouracil (Adrucil, 5-FU).  To help prevent nausea and vomiting after your treatment, we encourage you to take your nausea medication as prescribed.    If you develop nausea and vomiting that is not controlled by your nausea medication, call the clinic.   BELOW ARE SYMPTOMS THAT SHOULD BE REPORTED IMMEDIATELY:  *FEVER GREATER THAN 100.5 F  *CHILLS WITH OR WITHOUT FEVER  NAUSEA AND VOMITING THAT IS NOT CONTROLLED WITH YOUR NAUSEA MEDICATION  *UNUSUAL SHORTNESS OF BREATH  *UNUSUAL BRUISING OR BLEEDING  TENDERNESS IN MOUTH AND THROAT WITH OR WITHOUT PRESENCE OF ULCERS  *URINARY PROBLEMS  *BOWEL PROBLEMS  UNUSUAL RASH Items with * indicate a potential emergency and should be followed up as soon as possible.  Feel free to call the clinic should you have any questions or concerns. The clinic phone number is [phone].  Please show the [facility] at check-in to the Emergency Department and triage nurse.

## 2018-02-19 NOTE — Progress Notes (Signed)
Nutrition follow-up completed with patient during infusion for metastatic pancreas cancer. Weight documented as 89.1 pounds July 2 increased from 86 pounds June 27. Patient reports that since Remeron has been increased, her appetite has increased as well. Patient denies nausea and vomiting. Reports diarrhea is somewhat improved.  She is still having 2-4 stools daily.  She is figuring out how to take Lomotil and Imodium. Patient denies other nutrition impact symptoms. Patient is considering taking Creon to see if this improves diarrhea.  Nutrition diagnosis: Unintended weight loss is stable.  Intervention: Patient was educated to continue strategies for increased calories and protein. Recommended patient contact MD about Creon dosage. Patient should continue on other medications as prescribed by MD. Teach back method used.  Monitoring, evaluation, goals: Patient will tolerate adequate calories and protein to minimize weight loss.  Next visit: Tuesday, August 13 during infusion.  **Disclaimer: This note was dictated with voice recognition software. Similar sounding words can inadvertently be transcribed and this note may contain transcription errors which may not have been corrected upon publication of note.**

## 2018-02-19 NOTE — Telephone Encounter (Signed)
Appointments scheduled AVS/Calendar printed per 7/2 los °

## 2018-02-21 ENCOUNTER — Inpatient Hospital Stay: Payer: Medicare Other

## 2018-02-21 VITALS — BP 98/55 | HR 74 | Temp 98.1°F | Resp 17

## 2018-02-21 DIAGNOSIS — Z5111 Encounter for antineoplastic chemotherapy: Secondary | ICD-10-CM | POA: Diagnosis not present

## 2018-02-21 DIAGNOSIS — C787 Secondary malignant neoplasm of liver and intrahepatic bile duct: Principal | ICD-10-CM

## 2018-02-21 DIAGNOSIS — G620 Drug-induced polyneuropathy: Secondary | ICD-10-CM | POA: Diagnosis not present

## 2018-02-21 DIAGNOSIS — Z7189 Other specified counseling: Secondary | ICD-10-CM

## 2018-02-21 DIAGNOSIS — T451X5S Adverse effect of antineoplastic and immunosuppressive drugs, sequela: Secondary | ICD-10-CM | POA: Diagnosis not present

## 2018-02-21 DIAGNOSIS — C258 Malignant neoplasm of overlapping sites of pancreas: Secondary | ICD-10-CM | POA: Diagnosis not present

## 2018-02-21 DIAGNOSIS — E876 Hypokalemia: Secondary | ICD-10-CM | POA: Diagnosis not present

## 2018-02-21 DIAGNOSIS — C259 Malignant neoplasm of pancreas, unspecified: Secondary | ICD-10-CM

## 2018-02-21 MED ORDER — SODIUM CHLORIDE 0.9% FLUSH
10.0000 mL | INTRAVENOUS | Status: DC | PRN
  Administered 2018-02-21: 10 mL
  Filled 2018-02-21: qty 10

## 2018-02-21 MED ORDER — HEPARIN SOD (PORK) LOCK FLUSH 100 UNIT/ML IV SOLN
500.0000 [IU] | Freq: Once | INTRAVENOUS | Status: AC | PRN
  Administered 2018-02-21: 500 [IU]
  Filled 2018-02-21: qty 5

## 2018-02-21 NOTE — Patient Instructions (Signed)
Implanted Port Home Guide An implanted port is a type of central line that is placed under the skin. Central lines are used to provide IV access when treatment or nutrition needs to be given through a person's veins. Implanted ports are used for long-term IV access. An implanted port may be placed because:  You need IV medicine that would be irritating to the small veins in your hands or arms.  You need long-term IV medicines, such as antibiotics.  You need IV nutrition for a long period.  You need frequent blood draws for lab tests.  You need dialysis.  Implanted ports are usually placed in the chest area, but they can also be placed in the upper arm, the abdomen, or the leg. An implanted port has two main parts:  Reservoir. The reservoir is round and will appear as a small, raised area under your skin. The reservoir is the part where a needle is inserted to give medicines or draw blood.  Catheter. The catheter is a thin, flexible tube that extends from the reservoir. The catheter is placed into a large vein. Medicine that is inserted into the reservoir goes into the catheter and then into the vein.  How will I care for my incision site? Do not get the incision site wet. Bathe or shower as directed by your health care provider. How is my port accessed? Special steps must be taken to access the port:  Before the port is accessed, a numbing cream can be placed on the skin. This helps numb the skin over the port site.  Your health care provider uses a sterile technique to access the port. ? Your health care provider must put on a mask and sterile gloves. ? The skin over your port is cleaned carefully with an antiseptic and allowed to dry. ? The port is gently pinched between sterile gloves, and a needle is inserted into the port.  Only "non-coring" port needles should be used to access the port. Once the port is accessed, a blood return should be checked. This helps ensure that the port  is in the vein and is not clogged.  If your port needs to remain accessed for a constant infusion, a clear (transparent) bandage will be placed over the needle site. The bandage and needle will need to be changed every week, or as directed by your health care provider.  Keep the bandage covering the needle clean and dry. Do not get it wet. Follow your health care provider's instructions on how to take a shower or bath while the port is accessed.  If your port does not need to stay accessed, no bandage is needed over the port.  What is flushing? Flushing helps keep the port from getting clogged. Follow your health care provider's instructions on how and when to flush the port. Ports are usually flushed with saline solution or a medicine called heparin. The need for flushing will depend on how the port is used.  If the port is used for intermittent medicines or blood draws, the port will need to be flushed: ? After medicines have been given. ? After blood has been drawn. ? As part of routine maintenance.  If a constant infusion is running, the port may not need to be flushed.  How long will my port stay implanted? The port can stay in for as long as your health care provider thinks it is needed. When it is time for the port to come out, surgery will be   done to remove it. The procedure is similar to the one performed when the port was put in. When should I seek immediate medical care? When you have an implanted port, you should seek immediate medical care if:  You notice a bad smell coming from the incision site.  You have swelling, redness, or drainage at the incision site.  You have more swelling or pain at the port site or the surrounding area.  You have a fever that is not controlled with medicine.  This information is not intended to replace advice given to you by your health care provider. Make sure you discuss any questions you have with your health care provider. Document  Released: 08/07/2005 Document Revised: 01/13/2016 Document Reviewed: 04/14/2013 Elsevier Interactive Patient Education  2017 Elsevier Inc.  

## 2018-03-04 NOTE — Progress Notes (Addendum)
[facility]  [phone] [phone]  Clinic Follow up Note   Patient Care Team: Wilford Corner, MD as PCP - General (Gastroenterology) Truitt Merle, MD as Consulting Physician (Hematology) 03/05/2018  SUMMARY OF ONCOLOGIC HISTORY:   Pancreatic cancer metastasized to liver ([facility])   06/21/2017 Imaging    CT CAP IMPRESSION: 1. Indistinct low-attenuation mass in the uncinate process of the pancreas with dilatation of remainder of the pancreatic duct and some encasement of the SMA. These findings are highly indicative of pancreatic carcinoma. Recommend MRI to assess further. 2. Multiple low-attenuation lesions throughout the right and left lobes of liver most consistent with liver metastasis again MRI may be helpful. 3. Moderate change of abdominal aortic atherosclerosis. 4. Changes of centrilobular and paraseptal emphysema on CT of the chest. No lung lesion is seen.       07/06/2017 Initial Biopsy    Diagnosis Liver, needle/core biopsy, Right lobe - METASTATIC ADENOCARCINOMA.      07/10/2017 Initial Diagnosis    Pancreatic cancer metastasized to liver ([facility])      07/25/2017 -  Chemotherapy    Gemcitabine and Abraxane 3 weeks on and 1 week off starting 07/25/17       10/02/2017 Imaging    CT CAP W Contrast 10/02/17 IMPRESSION: 1. Reduced size of the primary pancreatic neoplasm, which still continues to encase the superior mesenteric artery and occlude the superior mesenteric vein. The numerous metastatic liver lesions show some improvement on portal venous phase images, with increase in the central necrosis and slight reduction in size. However, the arterial phase images, which are present today but were not available on the prior exam, demonstrate that there is a significant peripheral rind around each of these lesions and that the portal venous phase images underestimate total lesions size. No findings of metastatic disease to the  chest or skeleton. 2. Severe and worsened cachexia. 3. Low-grade mesenteric edema and a small amount of pelvic ascites. 4. Aortic Atherosclerosis (ICD10-I70.0) and Emphysema (ICD10-J43.9). 5.  Prominent stool throughout the colon favors constipation.      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  The Hereditary Gene Panel offered by [facility] includes sequencing and/or deletion duplication testing of the following 47 genes: APC, ATM, AXIN2, BARD1, BMPR1A, BRCA1, BRCA2, BRIP1, CDH1, CDK4, CDKN2A (p14ARF), CDKN2A (p16INK4a), CHEK2, CTNNA1, DICER1, EPCAM (Deletion/duplication testing only), GREM1 (promoter region deletion/duplication testing only), KIT, MEN1, MLH1, MSH2, MSH3, MSH6, MUTYH, NBN, NF1, NHTL1, PALB2, PDGFRA, PMS2, POLD1, POLE, PTEN, RAD50, RAD51C, RAD51D, SDHB, SDHC, SDHD, SMAD4, SMARCA4. STK11, TP53, TSC1, TSC2, and VHL.  The following genes were evaluated for sequence changes only: SDHA and HOXB13 c.251G>A variant only. The report date is November 27, 2017.       01/08/2018 Imaging    CT CAP W Contrast 01/08/18  IMPRESSION: 1. Overall, there appears to be slight progression of disease, as evidenced by slight growth of the primary pancreatic lesion. While some of the previously noted hepatic lesions appear to have resolved or gotten smaller, others appear to be new or are slightly larger than the prior examination, as detailed above. 2. Several unusual appearing pulmonary nodules scattered throughout the lungs bilaterally, some of which are solid while others are more subsolid in appearance. Although these could be infectious or inflammatory in etiology, the possibility of developing metastatic disease in the lungs should be considered, and close attention on follow-up studies is recommended. 3. Aortic  atherosclerosis. 4. Additional incidental findings, as above. Aortic Atherosclerosis  (ICD10-I70.0).       02/04/2018 -  Chemotherapy    The patient had leucovorin 544 mg in dextrose 5 % 250 mL infusion, 400 mg/m2 = 544 mg, Intravenous,  Once, 3 of 4 cycles Administration: 544 mg (02/05/2018), 544 mg (02/19/2018), 544 mg (03/05/2018) fluorouracil (ADRUCIL) 3,250 mg in sodium chloride 0.9 % 85 mL chemo infusion, 2,400 mg/m2 = 3,250 mg, Intravenous, 1 Day/Dose, 3 of 4 cycles Administration: 3,250 mg (02/05/2018), 3,250 mg (02/19/2018), 3,250 mg (03/05/2018) irinotecan LIPOSOME (ONIVYDE) 95 mg in sodium chloride 0.9 % 500 mL chemo infusion, 94.6 mg, Intravenous, Once, 3 of 4 cycles Administration: 95 mg (02/05/2018), 95 mg (02/19/2018), 95 mg (03/05/2018)  for chemotherapy treatment.      CURRENT THERAPY:5-fu pump and liposomal irinotecanevery 2 weeksstarted 02/05/18   INTERVAL HISTORY: Ms. Watkin returns for follow up as scheduled and cycle 3 chemotherapy. She is doing well, working with painting and ceramics and spending time outside in early morning hours. Appetite is improving. She took 30 mg mirtazapine for 2-3 days but was waking up angry so she only takes 1. She is craving food now. Diarrhea is improved, consistency is thicker now, not watery. Takes imodium prior to meals and lomotil PRN, 2 BMs per day. Denies blood in stool. No n/v. Chronic cough at baseline. Denies chest pain, dyspnea, fever, or chills. Denies numbness in her feet lately. Still smoking 7 half cigarettes per day, planning to use nicotine gum to help her quit.   REVIEW OF SYSTEMS:   Constitutional: Denies fevers, chills or abnormal weight loss (+) appetite improving  Eyes: Denies blurriness of vision Ears, nose, mouth, throat, and face: Denies mucositis or sore throat Respiratory: Denies cough, dyspnea or wheezes (+) chronic cough  Cardiovascular: Denies palpitation, chest discomfort or lower extremity swelling Gastrointestinal:  Denies nausea, vomiting, constipation, diarrhea, hematochezia, heartburn or change in  bowel habits (+) 2 BM per day, thicker consistency  Skin: Denies abnormal skin rashes Lymphatics: Denies new lymphadenopathy or easy bruising Neurological:Denies numbness, tingling or new weaknesses Behavioral/Psych: Mood is stable, no new changes (+) wakes up angry on 30 mg mirtazapine qHS   All other systems were reviewed with the patient and are negative.  MEDICAL HISTORY:  Past Medical History:  Diagnosis Date  . Anxiety   . Family history of colon cancer   . Family history of pancreatic cancer   . Family history of prostate cancer     SURGICAL HISTORY: Past Surgical History:  Procedure Laterality Date  . IR FLUORO GUIDE PORT INSERTION RIGHT  07/24/2017  . IR US GUIDE VASC ACCESS RIGHT  07/24/2017    I have reviewed the social history and family history with the patient and they are unchanged from previous note.  ALLERGIES:  has No Known Allergies.  MEDICATIONS:  Current Outpatient Medications  Medication Sig Dispense Refill  . diphenoxylate-atropine (LOMOTIL) 2.5-0.025 MG tablet TAKE 1 TABLET BY MOUTH 4 TIMES DAILY AS NEEDED FOR  DIARRHEA  OR  LOOSE  STOOLS 30 tablet 2  . LORazepam (ATIVAN) 1 MG tablet Take 1 mg 2 (two) times daily by mouth.    . mirtazapine (REMERON) 15 MG tablet Take 2 tablets (30 mg total) by mouth at bedtime. 30 tablet 3  . Multiple Vitamin (MULTIVITAMIN WITH MINERALS) TABS tablet Take 1 tablet daily by mouth. One-A-Day for Women    . potassium chloride SA (K-DUR,KLOR-CON) 20 MEQ tablet Take 1 tablet (20 mEq total) by  mouth daily. 40 tablet 1   No current facility-administered medications for this visit.    Facility-Administered Medications Ordered in Other Visits  Medication Dose Route Frequency Provider Last Rate Last Dose  . fluorouracil (ADRUCIL) 3,250 mg in sodium chloride 0.9 % 85 mL chemo infusion  2,400 mg/m2 (Treatment Plan Recorded) Intravenous 1 day or 1 dose Truitt Merle, MD   3,250 mg at 03/05/18 1351  . sodium chloride flush (NS) 0.9 %  injection 10 mL  10 mL Intracatheter PRN Truitt Merle, MD        PHYSICAL EXAMINATION: ECOG PERFORMANCE STATUS: 1 - Symptomatic but completely ambulatory  Vitals:   03/05/18 0929  BP: 107/64  Pulse: (!) 52  Resp: 17  Temp: 97.7 F (36.5 C)  SpO2: 100%   Filed Weights   03/05/18 0929  Weight: 86 lb 4.8 oz (39.1 kg)    GENERAL:alert, no distress and comfortable SKIN: no rashes or significant lesions EYES: normal, Conjunctiva are pink and non-injected, sclera clear OROPHARYNX:no thrush or ulcers   LYMPH:  no palpable cervical or supraclavicular lymphadenopathy LUNGS: rhonchus breath sounds; normal breathing effort HEART: regular rate & rhythm and no murmurs and no lower extremity edema ABDOMEN:abdomen soft, non-tender and normal bowel sounds Musculoskeletal:no cyanosis of digits and no clubbing  NEURO: alert & oriented x 3 with fluent speech, no focal motor/sensory deficits PAC without erythema   LABORATORY DATA:  I have reviewed the data as listed CBC Latest Ref Rng & Units 03/05/2018 02/19/2018 02/14/2018  WBC 3.9 - 10.3 K/uL 7.7 8.0 6.9  Hemoglobin 11.6 - 15.9 g/dL 11.0(L) 10.5(L) 10.4(L)  Hematocrit 34.8 - 46.6 % 32.1(L) 31.8(L) 31.2(L)  Platelets 145 - 400 K/uL 133(L) 179 302     CMP Latest Ref Rng & Units 03/05/2018 02/19/2018 02/14/2018  Glucose 70 - 99 mg/dL 83 88 94  BUN 8 - 23 mg/dL '10 10 19  ' Creatinine 0.44 - 1.00 mg/dL 0.86 0.88 0.89  Sodium 135 - 145 mmol/L 141 139 139  Potassium 3.5 - 5.1 mmol/L 3.3(L) 3.9 3.8  Chloride 98 - 111 mmol/L 113(H) 112(H) 109  CO2 22 - 32 mmol/L 21(L) 21(L) 23  Calcium 8.9 - 10.3 mg/dL 8.5(L) 9.2 9.3  Total Protein 6.5 - 8.1 g/dL 6.9 7.1 7.5  Total Bilirubin 0.3 - 1.2 mg/dL 0.3 <0.2(L) 0.3  Alkaline Phos 38 - 126 U/L 60 85 78  AST 15 - 41 U/L '18 19 24  ' ALT 0 - 44 U/L '26 19 21   ' PATHOLOGY: #1 biopsy from duodenum, distal stomach, GE junction no significant pathological changes, except reactive changes in stomach #2 transverse  colon, hepatic flexure, sigmoid, polyps biopsy showed tubular adenomas (3)  Liver Biopsy  Diagnosis 07/06/17 Liver, needle/core biopsy, Right lobe - METASTATIC ADENOCARCINOMA. Microscopic Comment The tumor is positive for cytokeratin 7 and negative for cytokeratin 20, CDX-2 and TTF-1. The immunoprofile is non-specific, but the differential includes a upper GI or pancreaticobiliary primary among others. Dr. Lyndon Code has reviewed the case. Dr. Michail Sermon was paged on 07/10/2017.    RADIOGRAPHIC STUDIES: I have personally reviewed the radiological images as listed and agreed with the findings in the report. No results found.   ASSESSMENT & PLAN: 67 y.o.African-American female, with past medical history of anxiety, otherwise healthy, presented with chronic diarrhea for 4 months, poor appetite and 35lbs weight loss. CT scan showed a pancreatic head mass and multiple liver metastasis.  1. 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 Diarrhea, controlled with  Lomotil before meals and Imodium as needed 3. Anorexia and weight loss, appetite improved on Remeron 30 mg nightly (increased on 02/14/2018) 4. Smoking cessation- quitting with nicotine patch, down to 7 cigarettes per day on 6/18 5. Goal of care discussion  6. 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 Wellman appears stable. She completed cycle 2 liposomal irinotecan/5FU, she tolerates treatment very well overall. Appetite and diarrhea are improved. Weight fluctuates but overall stable. She is followed by dietician. She will continue mirtazapine 15 mg qHS, she had mood fluctuation with increased dose. Labs reviewed, CBC and CMP are adequate to proceed with cycle 3 today. She was recently not taking oral potassium, K 3.3 today. I recommend she restart 20 mEq daily. Not following CA 19-9, was not elevated. She visits family in [place]  this week, we reviewed precautions for travel. F/u in 2 weeks for cycle 4.   PLAN: -Labs reviewed, proceed with cycle 3 liposomal irinotecan/5FU, continue q2 weeks -Restart oral potassium 20 mEq daily -Continue imodium, lomotil for diarrhea -Continue mirtazapine 15 mg qHS for appetite  -Travel precautions  -F/u in 2 weeks with cycle 4  All questions were answered. The patient knows to call the clinic with any problems, questions or concerns. No barriers to learning was detected.      Alla Feeling, NP 03/05/18   Addendum  I have seen the patient, examined her. I agree with the assessment and and plan and have edited the notes.   Ms Pete is tolerating 5-FU and irinotecan very well, mild diarrhea, well controlled.  No other significant side effects.  Her weight is stable.  Lab reviewed, adequate for treatment.  We will continue current treatment every 2 weeks.  Plan to repeat restaging scan after the fifth or sixth cycle chemo.  I have reviewed the above documentation for accuracy and completeness, and I agree with the above.

## 2018-03-05 ENCOUNTER — Inpatient Hospital Stay (HOSPITAL_BASED_OUTPATIENT_CLINIC_OR_DEPARTMENT_OTHER): Payer: Medicare Other | Admitting: Nurse Practitioner

## 2018-03-05 ENCOUNTER — Inpatient Hospital Stay: Payer: Medicare Other

## 2018-03-05 ENCOUNTER — Ambulatory Visit: Payer: Medicare Other

## 2018-03-05 ENCOUNTER — Other Ambulatory Visit: Payer: Medicare Other

## 2018-03-05 ENCOUNTER — Encounter: Payer: Self-pay | Admitting: Nurse Practitioner

## 2018-03-05 ENCOUNTER — Ambulatory Visit: Payer: Medicare Other | Admitting: Nurse Practitioner

## 2018-03-05 ENCOUNTER — Other Ambulatory Visit: Payer: Self-pay | Admitting: Hematology

## 2018-03-05 VITALS — BP 107/64 | HR 52 | Temp 97.7°F | Resp 17 | Ht 64.0 in | Wt 86.3 lb

## 2018-03-05 DIAGNOSIS — Z5111 Encounter for antineoplastic chemotherapy: Secondary | ICD-10-CM | POA: Diagnosis not present

## 2018-03-05 DIAGNOSIS — F17210 Nicotine dependence, cigarettes, uncomplicated: Secondary | ICD-10-CM

## 2018-03-05 DIAGNOSIS — C259 Malignant neoplasm of pancreas, unspecified: Secondary | ICD-10-CM

## 2018-03-05 DIAGNOSIS — Z8042 Family history of malignant neoplasm of prostate: Secondary | ICD-10-CM

## 2018-03-05 DIAGNOSIS — G620 Drug-induced polyneuropathy: Secondary | ICD-10-CM

## 2018-03-05 DIAGNOSIS — F419 Anxiety disorder, unspecified: Secondary | ICD-10-CM | POA: Diagnosis not present

## 2018-03-05 DIAGNOSIS — Z800 Family history of malignant neoplasm of digestive organs: Secondary | ICD-10-CM | POA: Diagnosis not present

## 2018-03-05 DIAGNOSIS — E876 Hypokalemia: Secondary | ICD-10-CM

## 2018-03-05 DIAGNOSIS — C787 Secondary malignant neoplasm of liver and intrahepatic bile duct: Secondary | ICD-10-CM

## 2018-03-05 DIAGNOSIS — Z79899 Other long term (current) drug therapy: Secondary | ICD-10-CM | POA: Diagnosis not present

## 2018-03-05 DIAGNOSIS — Z7189 Other specified counseling: Secondary | ICD-10-CM

## 2018-03-05 DIAGNOSIS — T451X5S Adverse effect of antineoplastic and immunosuppressive drugs, sequela: Secondary | ICD-10-CM

## 2018-03-05 DIAGNOSIS — R197 Diarrhea, unspecified: Secondary | ICD-10-CM

## 2018-03-05 DIAGNOSIS — C258 Malignant neoplasm of overlapping sites of pancreas: Secondary | ICD-10-CM | POA: Diagnosis not present

## 2018-03-05 LAB — COMPREHENSIVE METABOLIC PANEL
ALBUMIN: 3.9 g/dL (ref 3.5–5.0)
ALK PHOS: 60 U/L (ref 38–126)
ALT: 26 U/L (ref 0–44)
AST: 18 U/L (ref 15–41)
Anion gap: 7 (ref 5–15)
BUN: 10 mg/dL (ref 8–23)
CHLORIDE: 113 mmol/L — AB (ref 98–111)
CO2: 21 mmol/L — AB (ref 22–32)
CREATININE: 0.86 mg/dL (ref 0.44–1.00)
Calcium: 8.5 mg/dL — ABNORMAL LOW (ref 8.9–10.3)
GFR calc non Af Amer: >60 mL/min (ref >60)
Glucose, Bld: 83 mg/dL (ref 70–99)
Potassium: 3.3 mmol/L — ABNORMAL LOW (ref 3.5–5.1)
SODIUM: 141 mmol/L (ref 135–145)
Total Bilirubin: 0.3 mg/dL (ref 0.3–1.2)
Total Protein: 6.9 g/dL (ref 6.5–8.1)

## 2018-03-05 LAB — CBC WITH DIFFERENTIAL/PLATELET
Basophils Absolute: 0.1 10*3/uL (ref 0.0–0.1)
Basophils Relative: 1 %
EOS PCT: 4 %
Eosinophils Absolute: 0.3 10*3/uL (ref 0.0–0.5)
HEMATOCRIT: 32.1 % — AB (ref 34.8–46.6)
Hemoglobin: 11 g/dL — ABNORMAL LOW (ref 11.6–15.9)
LYMPHS ABS: 1.2 10*3/uL (ref 0.9–3.3)
LYMPHS PCT: 16 %
MCH: 36.5 pg — AB (ref 25.1–34.0)
MCHC: 34.1 g/dL (ref 31.5–36.0)
MCV: 106.8 fL — AB (ref 79.5–101.0)
MONO ABS: 0.6 10*3/uL (ref 0.1–0.9)
Monocytes Relative: 7 %
Neutro Abs: 5.5 10*3/uL (ref 1.5–6.5)
Neutrophils Relative %: 72 %
PLATELETS: 133 10*3/uL — AB (ref 145–400)
RBC: 3 MIL/uL — AB (ref 3.70–5.45)
RDW: 14.8 % — ABNORMAL HIGH (ref 11.2–14.5)
WBC: 7.7 10*3/uL (ref 3.9–10.3)

## 2018-03-05 MED ORDER — SODIUM CHLORIDE 0.9 % IV SOLN
Freq: Once | INTRAVENOUS | Status: AC
  Administered 2018-03-05: 10:00:00 via INTRAVENOUS

## 2018-03-05 MED ORDER — SODIUM CHLORIDE 0.9 % IV SOLN
2400.0000 mg/m2 | INTRAVENOUS | Status: DC
  Administered 2018-03-05: 3250 mg via INTRAVENOUS
  Filled 2018-03-05: qty 65

## 2018-03-05 MED ORDER — SODIUM CHLORIDE 0.9 % IV SOLN
95.0000 mg | Freq: Once | INTRAVENOUS | Status: AC
  Administered 2018-03-05: 95 mg via INTRAVENOUS
  Filled 2018-03-05: qty 22.09

## 2018-03-05 MED ORDER — SODIUM CHLORIDE 0.9% FLUSH
10.0000 mL | Freq: Once | INTRAVENOUS | Status: AC
  Administered 2018-03-05: 10 mL
  Filled 2018-03-05: qty 10

## 2018-03-05 MED ORDER — PROCHLORPERAZINE MALEATE 10 MG PO TABS
ORAL_TABLET | ORAL | Status: AC
  Filled 2018-03-05: qty 1

## 2018-03-05 MED ORDER — SODIUM CHLORIDE 0.9% FLUSH
10.0000 mL | INTRAVENOUS | Status: DC | PRN
  Filled 2018-03-05: qty 10

## 2018-03-05 MED ORDER — PROCHLORPERAZINE MALEATE 10 MG PO TABS
10.0000 mg | ORAL_TABLET | Freq: Once | ORAL | Status: AC
  Administered 2018-03-05: 10 mg via ORAL

## 2018-03-05 MED ORDER — LEUCOVORIN CALCIUM INJECTION 350 MG
400.0000 mg/m2 | Freq: Once | INTRAVENOUS | Status: AC
  Administered 2018-03-05: 544 mg via INTRAVENOUS
  Filled 2018-03-05: qty 27.2

## 2018-03-05 NOTE — Progress Notes (Signed)
Asked pt if she typically experiences diarrhea with the irinotecan liposome to assess pt need for atropine; pt responded she does not and declined atropine today.

## 2018-03-05 NOTE — Patient Instructions (Addendum)
[facility] Discharge Instructions for Patients Receiving Chemotherapy  Today you received the following chemotherapy agents: Irinotecan liposome (Onivyde), Leucovorin, and Fluorouracil (Adrucil, 5-FU).  Take your potassium pills each day.  To help prevent nausea and vomiting after your treatment, we encourage you to take your nausea medication as prescribed.    If you develop nausea and vomiting that is not controlled by your nausea medication, call the clinic.   BELOW ARE SYMPTOMS THAT SHOULD BE REPORTED IMMEDIATELY:  *FEVER GREATER THAN 100.5 F  *CHILLS WITH OR WITHOUT FEVER  NAUSEA AND VOMITING THAT IS NOT CONTROLLED WITH YOUR NAUSEA MEDICATION  *UNUSUAL SHORTNESS OF BREATH  *UNUSUAL BRUISING OR BLEEDING  TENDERNESS IN MOUTH AND THROAT WITH OR WITHOUT PRESENCE OF ULCERS  *URINARY PROBLEMS  *BOWEL PROBLEMS  UNUSUAL RASH Items with * indicate a potential emergency and should be followed up as soon as possible.  Feel free to call the clinic should you have any questions or concerns. The clinic phone number is [phone].  Please show the [facility] at check-in to the Emergency Department and triage nurse.   Hypokalemia Hypokalemia means that the amount of potassium in the blood is lower than normal.Potassium is a chemical that helps regulate the amount of fluid in the body (electrolyte). It also stimulates muscle tightening (contraction) and helps nerves work properly.Normally, most of the body's potassium is inside of cells, and only a very small amount is in the blood. Because the amount in the blood is so small, minor changes to potassium levels in the blood can be life-threatening. What are the causes? This condition may be caused by:  Antibiotic medicine.  Diarrhea or vomiting. Taking too much of a medicine that helps you have a bowel movement (laxative) can cause diarrhea and lead to hypokalemia.  Chronic kidney disease  (CKD).  Medicines that help the body get rid of excess fluid (diuretics).  Eating disorders, such as bulimia.  Low magnesium levels in the body.  Sweating a lot.  What are the signs or symptoms? Symptoms of this condition include:  Weakness.  Constipation.  Fatigue.  Muscle cramps.  Mental confusion.  Skipped heartbeats or irregular heartbeat (palpitations).  Tingling or numbness.  How is this diagnosed? This condition is diagnosed with a blood test. How is this treated? Hypokalemia can be treated by taking potassium supplements by mouth or adjusting the medicines that you take. Treatment may also include eating more foods that contain a lot of potassium. If your potassium level is very low, you may need to get potassium through an IV tube in one of your veins and be monitored in the hospital. Follow these instructions at home:  Take over-the-counter and prescription medicines only as told by your health care provider. This includes vitamins and supplements.  Eat a healthy diet. A healthy diet includes fresh fruits and vegetables, whole grains, healthy fats, and lean proteins.  If instructed, eat more foods that contain a lot of potassium, such as: ? Nuts, such as peanuts and pistachios. ? Seeds, such as sunflower seeds and pumpkin seeds. ? Peas, lentils, and lima beans. ? Whole grain and bran cereals and breads. ? Fresh fruits and vegetables, such as apricots, avocado, bananas, cantaloupe, kiwi, oranges, tomatoes, asparagus, and potatoes. ? Orange juice. ? Tomato juice. ? Red meats. ? Yogurt.  Keep all follow-up visits as told by your health care provider. This is important. Contact a health care provider if:  You have weakness that gets worse.  You feel your heart pounding or racing.  You vomit.  You have diarrhea.  You have diabetes (diabetes mellitus) and you have trouble keeping your blood sugar (glucose) in your target range. Get help right away  if:  You have chest pain.  You have shortness of breath.  You have vomiting or diarrhea that lasts for more than 2 days.  You faint. This information is not intended to replace advice given to you by your health care provider. Make sure you discuss any questions you have with your health care provider. Document Released: [id number] Document Revised: 03/25/2016 Document Reviewed: 03/25/2016 Elsevier Interactive Patient Education  2018 [employer].

## 2018-03-07 ENCOUNTER — Inpatient Hospital Stay: Payer: Medicare Other

## 2018-03-07 DIAGNOSIS — C787 Secondary malignant neoplasm of liver and intrahepatic bile duct: Secondary | ICD-10-CM | POA: Diagnosis not present

## 2018-03-07 DIAGNOSIS — C259 Malignant neoplasm of pancreas, unspecified: Secondary | ICD-10-CM

## 2018-03-07 DIAGNOSIS — G620 Drug-induced polyneuropathy: Secondary | ICD-10-CM | POA: Diagnosis not present

## 2018-03-07 DIAGNOSIS — C258 Malignant neoplasm of overlapping sites of pancreas: Secondary | ICD-10-CM | POA: Diagnosis not present

## 2018-03-07 DIAGNOSIS — E876 Hypokalemia: Secondary | ICD-10-CM | POA: Diagnosis not present

## 2018-03-07 DIAGNOSIS — Z95828 Presence of other vascular implants and grafts: Secondary | ICD-10-CM

## 2018-03-07 DIAGNOSIS — T451X5S Adverse effect of antineoplastic and immunosuppressive drugs, sequela: Secondary | ICD-10-CM | POA: Diagnosis not present

## 2018-03-07 DIAGNOSIS — Z5111 Encounter for antineoplastic chemotherapy: Secondary | ICD-10-CM | POA: Diagnosis not present

## 2018-03-07 MED ORDER — HEPARIN SOD (PORK) LOCK FLUSH 100 UNIT/ML IV SOLN
500.0000 [IU] | Freq: Once | INTRAVENOUS | Status: AC
  Administered 2018-03-07: 500 [IU] via INTRAVENOUS
  Filled 2018-03-07: qty 5

## 2018-03-07 MED ORDER — HEPARIN SOD (PORK) LOCK FLUSH 100 UNIT/ML IV SOLN
250.0000 [IU] | Freq: Once | INTRAVENOUS | Status: DC
  Filled 2018-03-07: qty 5

## 2018-03-07 MED ORDER — SODIUM CHLORIDE 0.9% FLUSH
10.0000 mL | Freq: Once | INTRAVENOUS | Status: AC
  Administered 2018-03-07: 10 mL
  Filled 2018-03-07: qty 10

## 2018-03-12 NOTE — Progress Notes (Signed)
[facility]  [phone] [phone]  Clinic Follow Up Note   Patient Care Team: Wilford Corner, MD as PCP - General (Gastroenterology) Truitt Merle, MD as Consulting Physician (Hematology)   Date of Service:  03/21/2018   CHIEF COMPLAINTS  Follow up for metastatic pancreatic cancer    Pancreatic cancer metastasized to liver ([place])   06/21/2017 Imaging    CT CAP IMPRESSION: 1. Indistinct low-attenuation mass in the uncinate process of the pancreas with dilatation of remainder of the pancreatic duct and some encasement of the SMA. These findings are highly indicative of pancreatic carcinoma. Recommend MRI to assess further. 2. Multiple low-attenuation lesions throughout the right and left lobes of liver most consistent with liver metastasis again MRI may be helpful. 3. Moderate change of abdominal aortic atherosclerosis. 4. Changes of centrilobular and paraseptal emphysema on CT of the chest. No lung lesion is seen.       07/06/2017 Initial Biopsy    Diagnosis Liver, needle/core biopsy, Right lobe - METASTATIC ADENOCARCINOMA.      07/10/2017 Initial Diagnosis    Pancreatic cancer metastasized to liver ([place])      07/25/2017 -  Chemotherapy    Gemcitabine and Abraxane 3 weeks on and 1 week off starting 07/25/17       10/02/2017 Imaging    CT CAP W Contrast 10/02/17 IMPRESSION: 1. Reduced size of the primary pancreatic neoplasm, which still continues to encase the superior mesenteric artery and occlude the superior mesenteric vein. The numerous metastatic liver lesions show some improvement on portal venous phase images, with increase in the central necrosis and slight reduction in size. However, the arterial phase images, which are present today but were not available on the prior exam, demonstrate that there is a significant peripheral rind around each of these lesions and that the portal venous phase images underestimate total  lesions size. No findings of metastatic disease to the chest or skeleton. 2. Severe and worsened cachexia. 3. Low-grade mesenteric edema and a small amount of pelvic ascites. 4. Aortic Atherosclerosis (ICD10-I70.0) and Emphysema (ICD10-J43.9). 5.  Prominent stool throughout the colon favors constipation.      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  The Hereditary Gene Panel offered by [facility] includes sequencing and/or deletion duplication testing of the following 47 genes: APC, ATM, AXIN2, BARD1, BMPR1A, BRCA1, BRCA2, BRIP1, CDH1, CDK4, CDKN2A (p14ARF), CDKN2A (p16INK4a), CHEK2, CTNNA1, DICER1, EPCAM (Deletion/duplication testing only), GREM1 (promoter region deletion/duplication testing only), KIT, MEN1, MLH1, MSH2, MSH3, MSH6, MUTYH, NBN, NF1, NHTL1, PALB2, PDGFRA, PMS2, POLD1, POLE, PTEN, RAD50, RAD51C, RAD51D, SDHB, SDHC, SDHD, SMAD4, SMARCA4. STK11, TP53, TSC1, TSC2, and VHL.  The following genes were evaluated for sequence changes only: SDHA and HOXB13 c.251G>A variant only. The report date is November 27, 2017.       01/08/2018 Imaging    CT CAP W Contrast 01/08/18  IMPRESSION: 1. Overall, there appears to be slight progression of disease, as evidenced by slight growth of the primary pancreatic lesion. While some of the previously noted hepatic lesions appear to have resolved or gotten smaller, others appear to be new or are slightly larger than the prior examination, as detailed above. 2. Several unusual appearing pulmonary nodules scattered throughout the lungs bilaterally, some of which are solid while others are more subsolid in appearance. Although these could be infectious or inflammatory in etiology, the possibility of developing metastatic disease in the lungs  should be considered, and close attention on follow-up studies is recommended. 3. Aortic atherosclerosis. 4.  Additional incidental findings, as above. Aortic Atherosclerosis (ICD10-I70.0).       02/04/2018 -  Chemotherapy    The patient had leucovorin 544 mg in dextrose 5 % 250 mL infusion, 400 mg/m2 = 544 mg, Intravenous,  Once, 3 of 6 cycles Administration: 544 mg (02/05/2018), 544 mg (02/19/2018), 544 mg (03/05/2018) fluorouracil (ADRUCIL) 3,250 mg in sodium chloride 0.9 % 85 mL chemo infusion, 2,400 mg/m2 = 3,250 mg, Intravenous, 1 Day/Dose, 3 of 6 cycles Administration: 3,250 mg (02/05/2018), 3,250 mg (02/19/2018), 3,250 mg (03/05/2018) irinotecan LIPOSOME (ONIVYDE) 95 mg in sodium chloride 0.9 % 500 mL chemo infusion, 94.6 mg, Intravenous, Once, 3 of 6 cycles Administration: 95 mg (02/05/2018), 95 mg (02/19/2018), 95 mg (03/05/2018)  for chemotherapy treatment.        HISTORY OF PRESENTING ILLNESS:  Tammy Adams 67 y.o. female is here because of her abnormal CT findings which is highly suspicious for metastatic pancreatic malignancy.  She was referred by her gastroenterologist Dr. Michail Sermon.  She presents to my clinic with her husband today.  She has been having diarrhea for 4 months, she has loose/watery BM after each meals, no pain, nausea, or bloating. She has had low appetite since then, she has been eating very little, she was also recommended to avoid dairy products by her primary care physician, per her husband, she has been taking boost 3 bottles a day lately, she has lost aobut 35 lbs, but has been stable lately since she is taking boosts.   She was initially referred by her primary care physician, and subsequently referred to GI Dr. Michail Sermon.  She underwent EGD and colonoscopy, which showed benign polyps and colon otherwise negative.,  CT chest, abdomen and pelvis was obtained on June 21, 2017, which showed a indistinct low-attenuation mass in the uncinate process of the pancreas with dilatation of pancreatic duct multiple, low-attenuation lesions throughout the right and left lobe of liver most  consistent with liver metastasis.  She was referred to IR for liver biopsy this Friday.  She is otherwise healthy, retired from post office, still works part-time as a Oceanographer.  She lives with her husband, who is disabled.  She overall has mild fatigue, but functions very well at home.  No other complaints.   CURRENT THERAPY:  Second line 5-fu pump and liposomal irinotecan every 2 weeks started on 02/05/2018  INTERVAL HISTORY   Tammy Adams is here for a follow up. She is here today alone. She feels good and has no complains. She is trying to stay active in her yard. She needs a Remeron refill. She was taking only one 39m pill per day for weight gain. She says that her appetite is improving and she is starting to crave food. No complaints after last chemo cycle. She says that her only problem is tingling in her feet especially when she lays down. It is more prominent in her right foot.  [place]     MEDICAL HISTORY:  Past Medical History:  Diagnosis Date  . Anxiety   . Family history of colon cancer   . Family history of pancreatic cancer   . Family history of prostate cancer     SURGICAL HISTORY: Past Surgical History:  Procedure Laterality Date  . IR FLUORO GUIDE PORT INSERTION RIGHT  07/24/2017  . IR [place] VASC ACCESS RIGHT  07/24/2017    SOCIAL HISTORY: Social History   Socioeconomic History  . Marital status: Married    Spouse name: Not on file  . Number of children: Not on file  . Years of education: Not on file  . Highest education level: Not on file  Occupational History  . Not on file  Social Needs  . Financial resource strain: Not on file  . Food insecurity:    Worry: Not on file    Inability: Not on file  . Transportation needs:    Medical: Not on file    Non-medical: Not on file  Tobacco Use  . Smoking status: Current Every Day Smoker    Packs/day: 0.50    Years: 50.00    Pack years: 25.00  .  Smokeless tobacco: Never Used  Substance and Sexual Activity  . Alcohol use: No    Frequency: Never  . Drug use: Yes    Types: Marijuana  . Sexual activity: Not on file  Lifestyle  . Physical activity:    Days per week: Not on file    Minutes per session: Not on file  . Stress: Not on file  Relationships  . Social connections:    Talks on phone: Not on file    Gets together: Not on file    Attends religious service: Not on file    Active member of club or organization: Not on file    Attends meetings of clubs or organizations: Not on file    Relationship status: Not on file  . Intimate partner violence:    Fear of current or ex partner: Not on file    Emotionally abused: Not on file    Physically abused: Not on file    Forced sexual activity: Not on file  Other Topics Concern  . Not on file  Social History Narrative  . Not on file    FAMILY HISTORY: Family History  Problem Relation Age of Onset  . Prostate cancer Father        d. 76  . Pancreatic cancer Maternal Aunt 70  . Colon cancer Maternal Grandmother        dx in her 61s  . Prostate cancer Maternal Uncle   . Lung cancer Cousin   . Cancer Cousin        unknown type  . Cancer Cousin 74       daughter of cousin with lung cancer    ALLERGIES:  has No Known Allergies.  MEDICATIONS:  Current Outpatient Medications  Medication Sig Dispense Refill  . diphenoxylate-atropine (LOMOTIL) 2.5-0.025 MG tablet TAKE 1 TABLET BY MOUTH 4 TIMES DAILY AS NEEDED FOR  DIARRHEA  OR  LOOSE  STOOLS 30 tablet 2  . LORazepam (ATIVAN) 1 MG tablet Take 1 mg 2 (two) times daily by mouth.    . mirtazapine (REMERON) 15 MG tablet Take 2 tablets (30 mg total) by mouth at bedtime. 30 tablet 3  . Multiple Vitamin (MULTIVITAMIN WITH MINERALS) TABS tablet Take 1 tablet daily by mouth. One-A-Day for Women    . potassium chloride SA (K-DUR,KLOR-CON) 20 MEQ tablet Take 1 tablet (20 mEq total) by mouth daily. 40 tablet 1   No current  facility-administered medications for this visit.     REVIEW OF SYSTEMS:   Constitutional: Denies fevers, chills or abnormal night sweats (+) improving appetite Eyes: Denies blurriness of vision, double vision or watery eyes Ears, nose, mouth, throat, and face: Denies mucositis or sore throat Respiratory: Denies cough,  dyspnea or wheezes Cardiovascular: Denies palpitation, chest discomfort or lower extremity swelling Gastrointestinal:  Denies nausea, heartburn, Skin: Denies abnormal skin rashes Lymphatics: Denies new lymphadenopathy or easy bruising Neurological:Denies numbness or new weaknesses (+) tingling in her feet,esp right foot when laying down Behavioral/Psych: Mood is stable, no new changes  All other systems were reviewed with the patient and are negative.  PHYSICAL EXAMINATION:  ECOG PERFORMANCE STATUS: 1 - Symptomatic but completely ambulatory  Vitals:   03/21/18 0829  BP: 106/63  Pulse: (!) 52  Resp: 18  Temp: 97.7 F (36.5 C)  TempSrc: Oral  SpO2: 100%  Weight: 85 lb 14.4 oz (39 kg)  Height: _0  (1.626 m)    GENERAL:alert, no distress and comfortable SKIN: skin color, texture, turgor are normal, no rashes or significant lesions EYES: normal, conjunctiva are pink and non-injected, sclera clear OROPHARYNX:no exudate, no erythema and lips, buccal mucosa, and tongue normal  NECK: supple, thyroid normal size, non-tender, without nodularity LYMPH:  no palpable lymphadenopathy in the cervical, axillary or inguinal LUNGS: clear to auscultation and percussion with normal breathing effort HEART: regular rate & rhythm and no murmurs and no lower extremity edema ABDOMEN:abdomen soft, non-tender and normal bowel sounds Musculoskeletal:no cyanosis of digits and no clubbing  PSYCH: alert & oriented x 3 with fluent speech NEURO: no focal motor/sensory deficits  LABORATORY DATA:  I have reviewed the data as listed CBC Latest Ref Rng & Units 03/21/2018 03/05/2018 02/19/2018    WBC 3.9 - 10.3 K/uL 6.5 7.7 8.0  Hemoglobin 11.6 - 15.9 g/dL 11.4(L) 11.0(L) 10.5(L)  Hematocrit 34.8 - 46.6 % 33.9(L) 32.1(L) 31.8(L)  Platelets 145 - 400 K/uL 139(L) 133(L) 179   CMP Latest Ref Rng & Units 03/21/2018 03/05/2018 02/19/2018  Glucose 70 - 99 mg/dL 87 83 88  BUN 8 - 23 mg/dL _1 Creatinine 0.44 - 1.00 mg/dL 0.86 0.86 0.88  Sodium 135 - 145 mmol/L 141 141 139  Potassium 3.5 - 5.1 mmol/L 3.8 3.3(L) 3.9  Chloride 98 - 111 mmol/L 112(H) 113(H) 112(H)  CO2 22 - 32 mmol/L 22 21(L) 21(L)  Calcium 8.9 - 10.3 mg/dL 9.1 8.5(L) 9.2  Total Protein 6.5 - 8.1 g/dL 7.2 6.9 7.1  Total Bilirubin 0.3 - 1.2 mg/dL 0.3 0.3 <0.2(L)  Alkaline Phos 38 - 126 U/L 61 60 85  AST 15 - 41 U/L _2 ALT 0 - 44 U/L _3 Tumor Marker CA19.9: 1  Results for ROSIO, WEISS (MRN [id number]) as of 03/12/2018 16:16  Ref. Range 09/14/2017 09:21  CA 19-9 Latest Ref Range: 0 - 35 U/mL 2    PATHOLOGY: #1 biopsy from duodenum, distal stomach, GE junction no significant pathological changes, except reactive changes in stomach #2 transverse colon, hepatic flexure, sigmoid, polyps biopsy showed tubular adenomas (3)  Liver Biopsy  Diagnosis 07/06/17 Liver, needle/core biopsy, Right lobe - METASTATIC ADENOCARCINOMA. Microscopic Comment The tumor is positive for cytokeratin 7 and negative for cytokeratin 20, CDX-2 and TTF-1. The immunoprofile is non-specific, but the differential includes a upper GI or pancreaticobiliary primary among others. Dr. Lyndon Code has reviewed the case. Dr. Michail Sermon was paged on 07/10/2017.   PROCEDURES  EGD: 05/28/2017: Impression:  -Normal esophagus- -Z line irregular, 40 cm from the incisors, biopsied. -Acute gastritis, biopsied, -Normal examined duodenum, biopsied.  Colonoscopy: March 28, 2017 Impression -Multiple polyps, one 7 mm at the hepatic flexure, one 3 mm at flexure, one 5 mm in the transverse colon, one 4 mm  in the sigmoid colon, one 14 mm in the sigmoid  colon, all removed -Normal mucosa in the entire examined colon -Diverticulosis in the sigmoid colon -Internal hemorrhoids   RADIOGRAPHIC STUDIES: I have personally reviewed the radiological images as listed and agreed with the findings in the report. No results found. CT CAP W Contrast 10/02/17 IMPRESSION: 1. Reduced size of the primary pancreatic neoplasm, which still continues to encase the superior mesenteric artery and occlude the superior mesenteric vein. The numerous metastatic liver lesions show some improvement on portal venous phase images, with increase in the central necrosis and slight reduction in size. However, the arterial phase images, which are present today but were not available on the prior exam, demonstrate that there is a significant peripheral rind around each of these lesions and that the portal venous phase images underestimate total lesions size. No findings of metastatic disease to the chest or skeleton. 2. Severe and worsened cachexia. 3. Low-grade mesenteric edema and a small amount of pelvic ascites. 4. Aortic Atherosclerosis (ICD10-I70.0) and Emphysema (ICD10-J43.9). 5.  Prominent stool throughout the colon favors constipation.  CT AP with contrast, 06/21/17 IMPRESSION: 1. Indistinct low-attenuation mass in the uncinate process of the pancreas with dilatation of remainder of the pancreatic duct and some encasement of the SMA. These findings are highly indicative of pancreatic carcinoma. Recommend MRI to assess further. 2. Multiple low-attenuation lesions throughout the right and left lobes of liver most consistent with liver metastasis again MRI may be helpful. 3. Moderate change of abdominal aortic atherosclerosis. 4. Changes of centrilobular and paraseptal emphysema on CT of the chest. No lung lesion is seen.  ASSESSMENT & PLAN:   67 y.o. African-American female, with past medical history of anxiety, otherwise healthy, presented with chronic diarrhea for 4  months, poor appetite and 35lbs weight loss.  CT scan showed a pancreatic head mass and multiple liver metastasis.  1. 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 -Her tumor has MSI stable, not a candidate for immunotherapy.   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  Unfortunately she is not a candidate for PARP inhibitor  -She has started first-line chemotherapy with gemcitabine and Abraxane, she tolerated the first cycle week 1 and 2 very well, with minimal side effects. -Restaging CT CAP from 10/02/17 reveals good partial response, with reduced size of the primary pancreatic neoplasm, the numerous metastatic liver lesions show some improvement on portal venous phase images, with increase in the central necrosis and slight reduction in size. No findings of metastatic disease to the chest or skeleton. I discussed results with pt and her daughter and they are pleased -she has developed significant neutropenia from chemo, and onpro was added from cycle 6-8, tolerated very well. -We discussed her CT CAP from 01/08/18 which shows mixed response, but overall slight disease progression with slight growth in pancreatic lesion while some liver lesions have resolved or reduced in size along with evidence of new small liver lesions. There are a few lung nodules which are concerning for metastasis there.  I reviewed her image with patient in person. -I discussed her current chemo regimen is no longer controlling her disease. These are more lines of treatment  available to control her disease. I do not think she will be able to tolerate the more  aggressive chemo such as FOLFIRINOX due to her nutrition status and comorbilities. So I recommended second line moderate chemo 5-fu pump and liposomal irinotecan every 2 weeks. If she tolerates very well, I may try FOLFIRINOX also. She is agreeable. -She has been tolerating 5-FU and liposomal irinotecan treatment very well, will continue every 2 weeks, plan to repeat staging CT after cycle 5. -She would like to go to [place] in October, and I agreed if she is stable and feeling well during her 2nd week of her chemo cycle.    2. Diarrhea  -Probable related to her pancreatic malignancy and chemo  -she feels creon is making her diarrhea worse, so she has stopped  - She can use imodium and I order lomotil for her to fill if she needs.  -Has 2-3 episodes per day, takes lomotil twice per day, I encouraged her to maximize use and take lomotil 4 times daily.    3.  Anorexia and weight loss  -She has been drinking boost 3 bottles a day, which has been stabilized lately -She has used marijuana in the past, still has some at home, she will try marijuana for her anorexia. -I previously advised her to ween off Ativan to once daily.  -She was prescribed Mirtazapine on 07/11/17 to help with her appetite.  She is on 15 mg daily, I encouraged her to try 30 mg (2 tab)  -she is eating better with good appetite  -Her appetite has improved with Rx Marinol. Off now.  -She has a good appetite off Marinol, weight is low but stable. I encouraged her to try to gain weight -Weight slightly decreased lately, I again encouraged her to take a nutritional supplement.   4. Smoking Cessation   -She is interested in quitting smoking, we previously encouraged her. She does not want to try nicotine products. She is asking about Chantix, has apt with PCP next week, previously recommended she discuss this with PCP.  -She is ready to quit smoking, I encouraged her and recommend she start with nicotine products.  -She is  currently trying to Nicoderm CQ patch, and working on quitting.    5. Goal of care discussion  -We again discussed the incurable nature of her cancer, and the overall poor prognosis, especially if she does not have good response to chemotherapy or progress on chemo -The patient understands the goal of care is palliative. -I recommend DNR/DNI, she will think about it   6. 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 No functional limitations or pain. Will monitor closely.    Plan  -Lab reviewed, adequate for treatment, will proceed chemo today and continue every 2 weeks  -f/u and chemo 5-fu and liposomal irinotecan in 4 weeks (8/24) with lab, flush, and CT CAP a few days before  -she will see NP Lattie Haw in 2 weeks before chemo   All questions were answered. The patient knows to call the clinic with any problems, questions or concerns.  I spent 20 minutes counseling the patient face to face. The total time spent in the appointment was 25 minutes and more than 50% was on counseling.  Dierdre Searles Dweik am acting as scribe for Dr. Truitt Merle.  I have reviewed the above documentation for accuracy and completeness, and I agree with the above.     Truitt Merle, MD 03/21/18

## 2018-03-21 ENCOUNTER — Inpatient Hospital Stay: Payer: Medicare Other

## 2018-03-21 ENCOUNTER — Telehealth: Payer: Self-pay | Admitting: Hematology

## 2018-03-21 ENCOUNTER — Encounter: Payer: Self-pay | Admitting: Hematology

## 2018-03-21 ENCOUNTER — Inpatient Hospital Stay: Payer: Medicare Other | Attending: Hematology

## 2018-03-21 ENCOUNTER — Inpatient Hospital Stay (HOSPITAL_BASED_OUTPATIENT_CLINIC_OR_DEPARTMENT_OTHER): Payer: Medicare Other | Admitting: Hematology

## 2018-03-21 VITALS — BP 106/63 | HR 52 | Temp 97.7°F | Resp 18 | Ht 64.0 in | Wt 85.9 lb

## 2018-03-21 DIAGNOSIS — C259 Malignant neoplasm of pancreas, unspecified: Secondary | ICD-10-CM

## 2018-03-21 DIAGNOSIS — C258 Malignant neoplasm of overlapping sites of pancreas: Secondary | ICD-10-CM | POA: Diagnosis not present

## 2018-03-21 DIAGNOSIS — Z79899 Other long term (current) drug therapy: Secondary | ICD-10-CM | POA: Insufficient documentation

## 2018-03-21 DIAGNOSIS — Z5111 Encounter for antineoplastic chemotherapy: Secondary | ICD-10-CM | POA: Diagnosis not present

## 2018-03-21 DIAGNOSIS — R5383 Other fatigue: Secondary | ICD-10-CM | POA: Diagnosis not present

## 2018-03-21 DIAGNOSIS — C787 Secondary malignant neoplasm of liver and intrahepatic bile duct: Secondary | ICD-10-CM | POA: Insufficient documentation

## 2018-03-21 DIAGNOSIS — F17210 Nicotine dependence, cigarettes, uncomplicated: Secondary | ICD-10-CM | POA: Diagnosis not present

## 2018-03-21 DIAGNOSIS — R630 Anorexia: Secondary | ICD-10-CM | POA: Insufficient documentation

## 2018-03-21 DIAGNOSIS — I700 Atherosclerosis of aorta: Secondary | ICD-10-CM | POA: Diagnosis not present

## 2018-03-21 DIAGNOSIS — G620 Drug-induced polyneuropathy: Secondary | ICD-10-CM | POA: Diagnosis not present

## 2018-03-21 DIAGNOSIS — R197 Diarrhea, unspecified: Secondary | ICD-10-CM | POA: Diagnosis not present

## 2018-03-21 DIAGNOSIS — Z800 Family history of malignant neoplasm of digestive organs: Secondary | ICD-10-CM | POA: Diagnosis not present

## 2018-03-21 DIAGNOSIS — F12920 Cannabis use, unspecified with intoxication, uncomplicated: Secondary | ICD-10-CM | POA: Diagnosis not present

## 2018-03-21 DIAGNOSIS — Z7189 Other specified counseling: Secondary | ICD-10-CM

## 2018-03-21 DIAGNOSIS — T451X5S Adverse effect of antineoplastic and immunosuppressive drugs, sequela: Secondary | ICD-10-CM

## 2018-03-21 LAB — COMPREHENSIVE METABOLIC PANEL
ALT: 29 U/L (ref 0–44)
AST: 24 U/L (ref 15–41)
Albumin: 3.9 g/dL (ref 3.5–5.0)
Alkaline Phosphatase: 61 U/L (ref 38–126)
Anion gap: 7 (ref 5–15)
BUN: 11 mg/dL (ref 8–23)
CHLORIDE: 112 mmol/L — AB (ref 98–111)
CO2: 22 mmol/L (ref 22–32)
Calcium: 9.1 mg/dL (ref 8.9–10.3)
Creatinine, Ser: 0.86 mg/dL (ref 0.44–1.00)
Glucose, Bld: 87 mg/dL (ref 70–99)
POTASSIUM: 3.8 mmol/L (ref 3.5–5.1)
Sodium: 141 mmol/L (ref 135–145)
Total Bilirubin: 0.3 mg/dL (ref 0.3–1.2)
Total Protein: 7.2 g/dL (ref 6.5–8.1)

## 2018-03-21 LAB — CBC WITH DIFFERENTIAL/PLATELET
BASOS ABS: 0 10*3/uL (ref 0.0–0.1)
Basophils Relative: 0 %
EOS PCT: 3 %
Eosinophils Absolute: 0.2 10*3/uL (ref 0.0–0.5)
HCT: 33.9 % — ABNORMAL LOW (ref 34.8–46.6)
Hemoglobin: 11.4 g/dL — ABNORMAL LOW (ref 11.6–15.9)
LYMPHS PCT: 24 %
Lymphs Abs: 1.5 10*3/uL (ref 0.9–3.3)
MCH: 35.3 pg — ABNORMAL HIGH (ref 25.1–34.0)
MCHC: 33.6 g/dL (ref 31.5–36.0)
MCV: 105 fL — AB (ref 79.5–101.0)
MONO ABS: 0.3 10*3/uL (ref 0.1–0.9)
Monocytes Relative: 5 %
Neutro Abs: 4.4 10*3/uL (ref 1.5–6.5)
Neutrophils Relative %: 68 %
Platelets: 139 10*3/uL — ABNORMAL LOW (ref 145–400)
RBC: 3.23 MIL/uL — ABNORMAL LOW (ref 3.70–5.45)
RDW: 13.8 % (ref 11.2–14.5)
WBC: 6.5 10*3/uL (ref 3.9–10.3)

## 2018-03-21 MED ORDER — SODIUM CHLORIDE 0.9 % IV SOLN
Freq: Once | INTRAVENOUS | Status: AC
  Administered 2018-03-21: 10:00:00 via INTRAVENOUS
  Filled 2018-03-21: qty 250

## 2018-03-21 MED ORDER — PROCHLORPERAZINE MALEATE 10 MG PO TABS
ORAL_TABLET | ORAL | Status: AC
  Filled 2018-03-21: qty 1

## 2018-03-21 MED ORDER — PROCHLORPERAZINE MALEATE 10 MG PO TABS
10.0000 mg | ORAL_TABLET | Freq: Once | ORAL | Status: AC
  Administered 2018-03-21: 10 mg via ORAL

## 2018-03-21 MED ORDER — IRINOTECAN HCL LIPOSOME CHEMO INJECTION 43 MG/10ML
95.0000 mg | INJECTION | Freq: Once | INTRAVENOUS | Status: AC
  Administered 2018-03-21: 95 mg via INTRAVENOUS
  Filled 2018-03-21: qty 22.09

## 2018-03-21 MED ORDER — SODIUM CHLORIDE 0.9% FLUSH
10.0000 mL | Freq: Once | INTRAVENOUS | Status: AC
  Administered 2018-03-21: 10 mL
  Filled 2018-03-21: qty 10

## 2018-03-21 MED ORDER — SODIUM CHLORIDE 0.9 % IV SOLN
2400.0000 mg/m2 | INTRAVENOUS | Status: DC
  Administered 2018-03-21: 3250 mg via INTRAVENOUS
  Filled 2018-03-21: qty 65

## 2018-03-21 MED ORDER — LEUCOVORIN CALCIUM INJECTION 350 MG
400.0000 mg/m2 | Freq: Once | INTRAMUSCULAR | Status: AC
  Administered 2018-03-21: 544 mg via INTRAVENOUS
  Filled 2018-03-21: qty 27.2

## 2018-03-21 NOTE — Telephone Encounter (Signed)
Contrast material w/ instructions also given per 8/1 los

## 2018-03-21 NOTE — Telephone Encounter (Signed)
I added appointments that I could and also added patient to infusion log book for 8/28/ AVS/Calendar printed per 8/1 los

## 2018-03-21 NOTE — Patient Instructions (Addendum)
[facility] Discharge Instructions for Patients Receiving Chemotherapy  Today you received the following chemotherapy agents Onivyde, Leucovorin and Adrucil  To help prevent nausea and vomiting after your treatment, we encourage you to take your nausea medication as directed.    If you develop nausea and vomiting that is not controlled by your nausea medication, call the clinic.   BELOW ARE SYMPTOMS THAT SHOULD BE REPORTED IMMEDIATELY:  *FEVER GREATER THAN 100.5 F  *CHILLS WITH OR WITHOUT FEVER  NAUSEA AND VOMITING THAT IS NOT CONTROLLED WITH YOUR NAUSEA MEDICATION  *UNUSUAL SHORTNESS OF BREATH  *UNUSUAL BRUISING OR BLEEDING  TENDERNESS IN MOUTH AND THROAT WITH OR WITHOUT PRESENCE OF ULCERS  *URINARY PROBLEMS  *BOWEL PROBLEMS  UNUSUAL RASH Items with * indicate a potential emergency and should be followed up as soon as possible.  Feel free to call the clinic should you have any questions or concerns. The clinic phone number is [phone].  Please show the CHEMO ALERT CARD at check-in to the Emergency Department and triage nurse.   

## 2018-03-23 ENCOUNTER — Inpatient Hospital Stay: Payer: Medicare Other

## 2018-03-23 VITALS — BP 108/71 | HR 78 | Temp 97.9°F | Resp 18

## 2018-03-23 DIAGNOSIS — Z7189 Other specified counseling: Secondary | ICD-10-CM

## 2018-03-23 DIAGNOSIS — C787 Secondary malignant neoplasm of liver and intrahepatic bile duct: Secondary | ICD-10-CM

## 2018-03-23 DIAGNOSIS — Z5111 Encounter for antineoplastic chemotherapy: Secondary | ICD-10-CM | POA: Diagnosis not present

## 2018-03-23 DIAGNOSIS — C259 Malignant neoplasm of pancreas, unspecified: Secondary | ICD-10-CM

## 2018-03-23 MED ORDER — HEPARIN SOD (PORK) LOCK FLUSH 100 UNIT/ML IV SOLN
500.0000 [IU] | Freq: Once | INTRAVENOUS | Status: AC | PRN
  Administered 2018-03-23: 500 [IU]
  Filled 2018-03-23: qty 5

## 2018-03-23 MED ORDER — SODIUM CHLORIDE 0.9% FLUSH
10.0000 mL | INTRAVENOUS | Status: DC | PRN
  Administered 2018-03-23: 10 mL
  Filled 2018-03-23: qty 10

## 2018-03-23 NOTE — Patient Instructions (Signed)
Implanted Port Home Guide An implanted port is a type of central line that is placed under the skin. Central lines are used to provide IV access when treatment or nutrition needs to be given through a person's veins. Implanted ports are used for long-term IV access. An implanted port may be placed because:  You need IV medicine that would be irritating to the small veins in your hands or arms.  You need long-term IV medicines, such as antibiotics.  You need IV nutrition for a long period.  You need frequent blood draws for lab tests.  You need dialysis.  Implanted ports are usually placed in the chest area, but they can also be placed in the upper arm, the abdomen, or the leg. An implanted port has two main parts:  Reservoir. The reservoir is round and will appear as a small, raised area under your skin. The reservoir is the part where a needle is inserted to give medicines or draw blood.  Catheter. The catheter is a thin, flexible tube that extends from the reservoir. The catheter is placed into a large vein. Medicine that is inserted into the reservoir goes into the catheter and then into the vein.  How will I care for my incision site? Do not get the incision site wet. Bathe or shower as directed by your health care provider. How is my port accessed? Special steps must be taken to access the port:  Before the port is accessed, a numbing cream can be placed on the skin. This helps numb the skin over the port site.  Your health care provider uses a sterile technique to access the port. ? Your health care provider must put on a mask and sterile gloves. ? The skin over your port is cleaned carefully with an antiseptic and allowed to dry. ? The port is gently pinched between sterile gloves, and a needle is inserted into the port.  Only "non-coring" port needles should be used to access the port. Once the port is accessed, a blood return should be checked. This helps ensure that the port  is in the vein and is not clogged.  If your port needs to remain accessed for a constant infusion, a clear (transparent) bandage will be placed over the needle site. The bandage and needle will need to be changed every week, or as directed by your health care provider.  Keep the bandage covering the needle clean and dry. Do not get it wet. Follow your health care provider's instructions on how to take a shower or bath while the port is accessed.  If your port does not need to stay accessed, no bandage is needed over the port.  What is flushing? Flushing helps keep the port from getting clogged. Follow your health care provider's instructions on how and when to flush the port. Ports are usually flushed with saline solution or a medicine called heparin. The need for flushing will depend on how the port is used.  If the port is used for intermittent medicines or blood draws, the port will need to be flushed: ? After medicines have been given. ? After blood has been drawn. ? As part of routine maintenance.  If a constant infusion is running, the port may not need to be flushed.  How long will my port stay implanted? The port can stay in for as long as your health care provider thinks it is needed. When it is time for the port to come out, surgery will be   done to remove it. The procedure is similar to the one performed when the port was put in. When should I seek immediate medical care? When you have an implanted port, you should seek immediate medical care if:  You notice a bad smell coming from the incision site.  You have swelling, redness, or drainage at the incision site.  You have more swelling or pain at the port site or the surrounding area.  You have a fever that is not controlled with medicine.  This information is not intended to replace advice given to you by your health care provider. Make sure you discuss any questions you have with your health care provider. Document  Released: 08/07/2005 Document Revised: 01/13/2016 Document Reviewed: 04/14/2013 Elsevier Interactive Patient Education  2017 Elsevier Inc.  

## 2018-03-23 NOTE — Progress Notes (Signed)
Pt removed port dressing this morning d/t "edges loosening." Biopatch still in place at time of deaccess. Re-educated patient regarding importance of leaving dressing in place for the duration of pump use and port access with needle. If needed in the future, pt to ask nursing staff for reinforcement prior to going home with the pump. Emphasized that removing the dressing before time of deaccess can lead to infection for pt. Pt verbalized understanding of teaching.

## 2018-03-26 DIAGNOSIS — F418 Other specified anxiety disorders: Secondary | ICD-10-CM | POA: Diagnosis not present

## 2018-03-26 DIAGNOSIS — E43 Unspecified severe protein-calorie malnutrition: Secondary | ICD-10-CM | POA: Diagnosis not present

## 2018-03-26 DIAGNOSIS — C787 Secondary malignant neoplasm of liver and intrahepatic bile duct: Secondary | ICD-10-CM | POA: Diagnosis not present

## 2018-03-26 DIAGNOSIS — C259 Malignant neoplasm of pancreas, unspecified: Secondary | ICD-10-CM | POA: Diagnosis not present

## 2018-04-02 ENCOUNTER — Inpatient Hospital Stay (HOSPITAL_BASED_OUTPATIENT_CLINIC_OR_DEPARTMENT_OTHER): Payer: Medicare Other | Admitting: Nurse Practitioner

## 2018-04-02 ENCOUNTER — Inpatient Hospital Stay: Payer: Medicare Other

## 2018-04-02 ENCOUNTER — Encounter: Payer: Self-pay | Admitting: Nurse Practitioner

## 2018-04-02 VITALS — BP 104/61 | HR 66 | Temp 97.8°F | Resp 18 | Ht 64.0 in | Wt 85.6 lb

## 2018-04-02 DIAGNOSIS — C258 Malignant neoplasm of overlapping sites of pancreas: Secondary | ICD-10-CM

## 2018-04-02 DIAGNOSIS — C787 Secondary malignant neoplasm of liver and intrahepatic bile duct: Principal | ICD-10-CM

## 2018-04-02 DIAGNOSIS — I700 Atherosclerosis of aorta: Secondary | ICD-10-CM

## 2018-04-02 DIAGNOSIS — R630 Anorexia: Secondary | ICD-10-CM

## 2018-04-02 DIAGNOSIS — Z79899 Other long term (current) drug therapy: Secondary | ICD-10-CM

## 2018-04-02 DIAGNOSIS — R5383 Other fatigue: Secondary | ICD-10-CM

## 2018-04-02 DIAGNOSIS — F12920 Cannabis use, unspecified with intoxication, uncomplicated: Secondary | ICD-10-CM

## 2018-04-02 DIAGNOSIS — C259 Malignant neoplasm of pancreas, unspecified: Secondary | ICD-10-CM

## 2018-04-02 DIAGNOSIS — F17210 Nicotine dependence, cigarettes, uncomplicated: Secondary | ICD-10-CM

## 2018-04-02 DIAGNOSIS — G620 Drug-induced polyneuropathy: Secondary | ICD-10-CM | POA: Diagnosis not present

## 2018-04-02 DIAGNOSIS — R197 Diarrhea, unspecified: Secondary | ICD-10-CM

## 2018-04-02 DIAGNOSIS — T451X5S Adverse effect of antineoplastic and immunosuppressive drugs, sequela: Secondary | ICD-10-CM | POA: Diagnosis not present

## 2018-04-02 DIAGNOSIS — Z7189 Other specified counseling: Secondary | ICD-10-CM

## 2018-04-02 DIAGNOSIS — Z5111 Encounter for antineoplastic chemotherapy: Secondary | ICD-10-CM | POA: Diagnosis not present

## 2018-04-02 LAB — CBC WITH DIFFERENTIAL/PLATELET
BASOS ABS: 0 10*3/uL (ref 0.0–0.1)
Basophils Relative: 1 %
Eosinophils Absolute: 0.1 10*3/uL (ref 0.0–0.5)
Eosinophils Relative: 2 %
HEMATOCRIT: 33 % — AB (ref 34.8–46.6)
Hemoglobin: 11.1 g/dL — ABNORMAL LOW (ref 11.6–15.9)
LYMPHS PCT: 22 %
Lymphs Abs: 1.1 10*3/uL (ref 0.9–3.3)
MCH: 35.8 pg — ABNORMAL HIGH (ref 25.1–34.0)
MCHC: 33.8 g/dL (ref 31.5–36.0)
MCV: 105.8 fL — AB (ref 79.5–101.0)
MONO ABS: 0.3 10*3/uL (ref 0.1–0.9)
MONOS PCT: 7 %
NEUTROS ABS: 3.4 10*3/uL (ref 1.5–6.5)
Neutrophils Relative %: 68 %
PLATELETS: 137 10*3/uL — AB (ref 145–400)
RBC: 3.11 MIL/uL — ABNORMAL LOW (ref 3.70–5.45)
RDW: 14.6 % — AB (ref 11.2–14.5)
WBC: 5 10*3/uL (ref 3.9–10.3)

## 2018-04-02 LAB — COMPREHENSIVE METABOLIC PANEL
ALBUMIN: 3.7 g/dL (ref 3.5–5.0)
ALK PHOS: 66 U/L (ref 38–126)
ALT: 27 U/L (ref 0–44)
ANION GAP: 8 (ref 5–15)
AST: 19 U/L (ref 15–41)
BUN: 11 mg/dL (ref 8–23)
CALCIUM: 8.5 mg/dL — AB (ref 8.9–10.3)
CO2: 19 mmol/L — AB (ref 22–32)
Chloride: 114 mmol/L — ABNORMAL HIGH (ref 98–111)
Creatinine, Ser: 0.85 mg/dL (ref 0.44–1.00)
GFR calc Af Amer: >60 mL/min (ref >60)
GFR calc non Af Amer: >60 mL/min (ref >60)
GLUCOSE: 85 mg/dL (ref 70–99)
Potassium: 3.7 mmol/L (ref 3.5–5.1)
SODIUM: 141 mmol/L (ref 135–145)
TOTAL PROTEIN: 6.8 g/dL (ref 6.5–8.1)

## 2018-04-02 MED ORDER — PROCHLORPERAZINE MALEATE 10 MG PO TABS
10.0000 mg | ORAL_TABLET | Freq: Once | ORAL | Status: AC
  Administered 2018-04-02: 10 mg via ORAL

## 2018-04-02 MED ORDER — DRONABINOL 2.5 MG PO CAPS: 2.5000 mg | ORAL_CAPSULE | Freq: Two times a day (BID) | ORAL | 0 refills | Status: DC

## 2018-04-02 MED ORDER — SODIUM CHLORIDE 0.9 % IV SOLN
95.0000 mg | Freq: Once | INTRAVENOUS | Status: AC
  Administered 2018-04-02: 95 mg via INTRAVENOUS
  Filled 2018-04-02: qty 22.09

## 2018-04-02 MED ORDER — SODIUM CHLORIDE 0.9% FLUSH
10.0000 mL | Freq: Once | INTRAVENOUS | Status: AC
  Administered 2018-04-02: 10 mL
  Filled 2018-04-02: qty 10

## 2018-04-02 MED ORDER — SODIUM CHLORIDE 0.9 % IV SOLN
Freq: Once | INTRAVENOUS | Status: AC
  Administered 2018-04-02: 10:00:00 via INTRAVENOUS
  Filled 2018-04-02: qty 250

## 2018-04-02 MED ORDER — PROCHLORPERAZINE MALEATE 10 MG PO TABS
ORAL_TABLET | ORAL | Status: AC
  Filled 2018-04-02: qty 1

## 2018-04-02 MED ORDER — SODIUM CHLORIDE 0.9 % IV SOLN
2400.0000 mg/m2 | INTRAVENOUS | Status: DC
  Administered 2018-04-02: 3250 mg via INTRAVENOUS
  Filled 2018-04-02: qty 65

## 2018-04-02 MED ORDER — LEUCOVORIN CALCIUM INJECTION 350 MG
400.0000 mg/m2 | Freq: Once | INTRAVENOUS | Status: AC
  Administered 2018-04-02: 544 mg via INTRAVENOUS
  Filled 2018-04-02: qty 27.2

## 2018-04-02 NOTE — Addendum Note (Signed)
Addended by: Truitt Merle on: 04/02/2018 10:09 AM   Modules accepted: Orders

## 2018-04-02 NOTE — Progress Notes (Signed)
  [facility] OFFICE PROGRESS NOTE   Diagnosis: Pancreas cancer  INTERVAL HISTORY:   Ms. Marsland returns as scheduled.  She completed cycle 4 5-FU and liposomal irinotecan 03/21/2018.  She denies nausea/vomiting.  No mouth sores.  Diarrhea continues to be improved, "not as watery".  Appetite varies.  She would like to resume Marinol.  She denies pain.  Neuropathy symptoms have improved.  Objective:  Vital signs in last 24 hours:  Blood pressure 104/61, pulse 66, temperature 97.8 F (36.6 C), temperature source Oral, resp. rate 18, height _0  (1.626 m), weight 85 lb 9.6 oz (38.8 kg), SpO2 100 %.    HEENT: No thrush or ulcers. Resp: Lungs clear bilaterally. Cardio: Regular rate and rhythm. GI: Abdomen soft and nontender.  No hepatomegaly. Vascular: No leg edema.  Calves soft and nontender. Neuro: Alert and oriented.  Port-A-Cath without erythema.  Lab Results:  Lab Results  Component Value Date   WBC 5.0 04/02/2018   HGB 11.1 (L) 04/02/2018   HCT 33.0 (L) 04/02/2018   MCV 105.8 (H) 04/02/2018   PLT 137 (L) 04/02/2018   NEUTROABS 3.4 04/02/2018    Imaging:  No results found.  Medications: I have reviewed the patient's current medications.  Assessment/Plan: 1. 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 2. Anorexia and weight loss.  Weight stable.  Plan to resume Marinol. 3. Diarrhea.  Improved. 4. Smoking cessation 5. Goals of care.  Per previous discussion with Dr. Burr Medico she understands the goal of care is palliative. 6. Peripheral neuropathy involving feet, grade 1  Disposition: Ms. Gatchel appears stable.  She has completed 4 cycles of 5-FU and liposomal irinotecan.  Plan to proceed with cycle 5 today as scheduled.  She will undergo restaging CTs 04/12/2018.    CBC from today  reviewed.  Counts adequate for treatment.    She will return for lab and follow-up in 2 weeks.  She will contact the office in the interim with any problems.  Plan reviewed with Dr. Burr Medico.    Ned Card ANP/GNP-BC   04/02/2018  9:32 AM

## 2018-04-02 NOTE — Patient Instructions (Signed)
[facility] Discharge Instructions for Patients Receiving Chemotherapy  Today you received the following chemotherapy agents Onivyde, Leucovorin and Adrucil  To help prevent nausea and vomiting after your treatment, we encourage you to take your nausea medication as directed.    If you develop nausea and vomiting that is not controlled by your nausea medication, call the clinic.   BELOW ARE SYMPTOMS THAT SHOULD BE REPORTED IMMEDIATELY:  *FEVER GREATER THAN 100.5 F  *CHILLS WITH OR WITHOUT FEVER  NAUSEA AND VOMITING THAT IS NOT CONTROLLED WITH YOUR NAUSEA MEDICATION  *UNUSUAL SHORTNESS OF BREATH  *UNUSUAL BRUISING OR BLEEDING  TENDERNESS IN MOUTH AND THROAT WITH OR WITHOUT PRESENCE OF ULCERS  *URINARY PROBLEMS  *BOWEL PROBLEMS  UNUSUAL RASH Items with * indicate a potential emergency and should be followed up as soon as possible.  Feel free to call the clinic should you have any questions or concerns. The clinic phone number is [phone].  Please show the CHEMO ALERT CARD at check-in to the Emergency Department and triage nurse.   

## 2018-04-02 NOTE — Progress Notes (Signed)
Nutrition Follow-up:  Patient with metastatic pancreas cancer.  Patient followed by Dr. Burr Medico.  Patient undergoing chemotherapy.    Met with patient during infusion this am.  Patient reports that she is hungry now and plans on getting something to eat when she leaves infusion.  Did not eat breakfast due to now wanting to have diarrhea during infusion.  Reports diarrhea is better, the other day only had 1 loose not watery stool.  Most days 2-4 loose stools usually 2 hours after eating.  Reports drinks boost 2-3 per day.  Reports that she is eating 3 meals per day.  Breakfast is fruit and plain toast.  Lunch and dinner is meatloaf or [place] or barbecue with baked beans, salad.    Reports that she wants to start taking marinol again   Medications: reviewed  Labs: glucose 114  Anthropometrics:   Weight decreased to 85 lb 9.6 oz today from weight of 89 lb on July 2nd.     NUTRITION DIAGNOSIS: Unintended weight loss continues    INTERVENTION:  Reviewed with patient how she can increase calories and protein with current eating pattern.   Encouraged patient to continue with oral nutrition supplements of boost 2-3 times per day.   Encouraged patient to continue with imodium and lomotil to control diarrhea.     MONITORING, EVALUATION, GOAL: Patient will tolerate adequate calories and protein to minimize weight loss.   NEXT VISIT: Wednesday, August 28 during infusion  Montrey Buist B. Zenia Resides, [place], [place] Registered Dietitian [phone] (pager)

## 2018-04-04 ENCOUNTER — Inpatient Hospital Stay: Payer: Medicare Other

## 2018-04-04 VITALS — BP 110/62 | HR 66 | Temp 98.2°F | Resp 17

## 2018-04-04 DIAGNOSIS — C259 Malignant neoplasm of pancreas, unspecified: Secondary | ICD-10-CM

## 2018-04-04 DIAGNOSIS — C787 Secondary malignant neoplasm of liver and intrahepatic bile duct: Secondary | ICD-10-CM

## 2018-04-04 DIAGNOSIS — Z7189 Other specified counseling: Secondary | ICD-10-CM

## 2018-04-04 DIAGNOSIS — Z5111 Encounter for antineoplastic chemotherapy: Secondary | ICD-10-CM | POA: Diagnosis not present

## 2018-04-04 MED ORDER — SODIUM CHLORIDE 0.9% FLUSH
10.0000 mL | INTRAVENOUS | Status: DC | PRN
  Administered 2018-04-04: 10 mL
  Filled 2018-04-04: qty 10

## 2018-04-04 MED ORDER — HEPARIN SOD (PORK) LOCK FLUSH 100 UNIT/ML IV SOLN
500.0000 [IU] | Freq: Once | INTRAVENOUS | Status: AC | PRN
  Administered 2018-04-04: 500 [IU]
  Filled 2018-04-04: qty 5

## 2018-04-05 ENCOUNTER — Other Ambulatory Visit: Payer: Self-pay | Admitting: Hematology

## 2018-04-05 DIAGNOSIS — C259 Malignant neoplasm of pancreas, unspecified: Secondary | ICD-10-CM

## 2018-04-05 DIAGNOSIS — C787 Secondary malignant neoplasm of liver and intrahepatic bile duct: Principal | ICD-10-CM

## 2018-04-12 ENCOUNTER — Ambulatory Visit (HOSPITAL_COMMUNITY)
Admission: RE | Admit: 2018-04-12 | Discharge: 2018-04-12 | Disposition: A | Discharge status: Home / Self Care | Payer: Medicare Other | Source: Ambulatory Visit | Attending: Hematology | Admitting: Hematology

## 2018-04-12 ENCOUNTER — Other Ambulatory Visit: Payer: Self-pay | Admitting: Nurse Practitioner

## 2018-04-12 DIAGNOSIS — C259 Malignant neoplasm of pancreas, unspecified: Secondary | ICD-10-CM | POA: Insufficient documentation

## 2018-04-12 DIAGNOSIS — R918 Other nonspecific abnormal finding of lung field: Secondary | ICD-10-CM | POA: Diagnosis not present

## 2018-04-12 DIAGNOSIS — C787 Secondary malignant neoplasm of liver and intrahepatic bile duct: Principal | ICD-10-CM

## 2018-04-12 MED ORDER — DIPHENOXYLATE-ATROPINE 2.5-0.025 MG PO TABS: ORAL_TABLET | ORAL | 2 refills | Status: DC

## 2018-04-12 MED ORDER — IOHEXOL 300 MG/ML  SOLN
75.0000 mL | Freq: Once | INTRAMUSCULAR | Status: AC | PRN
  Administered 2018-04-12: 75 mL via INTRAVENOUS

## 2018-04-14 ENCOUNTER — Other Ambulatory Visit: Payer: Self-pay | Admitting: Hematology

## 2018-04-16 NOTE — Progress Notes (Addendum)
[place]  [phone] [phone]  Clinic Follow up Note   Patient Care Team: Wilford Corner, MD as PCP - General (Gastroenterology) Truitt Merle, MD as Consulting Physician (Hematology) 04/17/2018  SUMMARY OF ONCOLOGIC HISTORY:   Pancreatic cancer metastasized to liver [facility])   06/21/2017 Imaging    CT CAP IMPRESSION: 1. Indistinct low-attenuation mass in the uncinate process of the pancreas with dilatation of remainder of the pancreatic duct and some encasement of the SMA. These findings are highly indicative of pancreatic carcinoma. Recommend MRI to assess further. 2. Multiple low-attenuation lesions throughout the right and left lobes of liver most consistent with liver metastasis again MRI may be helpful. 3. Moderate change of abdominal aortic atherosclerosis. 4. Changes of centrilobular and paraseptal emphysema on CT of the chest. No lung lesion is seen.     07/06/2017 Initial Biopsy    Diagnosis Liver, needle/core biopsy, Right lobe - METASTATIC ADENOCARCINOMA.    07/10/2017 Initial Diagnosis    Pancreatic cancer metastasized to liver ([place])    07/25/2017 -  Chemotherapy    Gemcitabine and Abraxane 3 weeks on and 1 week off starting 07/25/17     10/02/2017 Imaging    CT CAP W Contrast 10/02/17 IMPRESSION: 1. Reduced size of the primary pancreatic neoplasm, which still continues to encase the superior mesenteric artery and occlude the superior mesenteric vein. The numerous metastatic liver lesions show some improvement on portal venous phase images, with increase in the central necrosis and slight reduction in size. However, the arterial phase images, which are present today but were not available on the prior exam, demonstrate that there is a significant peripheral rind around each of these lesions and that the portal venous phase images underestimate total lesions size. No findings of metastatic disease to the chest or  skeleton. 2. Severe and worsened cachexia. 3. Low-grade mesenteric edema and a small amount of pelvic ascites. 4. Aortic Atherosclerosis (ICD10-I70.0) and Emphysema (ICD10-J43.9). 5.  Prominent stool throughout the colon favors constipation.    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  The Hereditary Gene Panel offered by [facility] includes sequencing and/or deletion duplication testing of the following 47 genes: APC, ATM, AXIN2, BARD1, BMPR1A, BRCA1, BRCA2, BRIP1, CDH1, CDK4, CDKN2A (p14ARF), CDKN2A (p16INK4a), CHEK2, CTNNA1, DICER1, EPCAM (Deletion/duplication testing only), GREM1 (promoter region deletion/duplication testing only), KIT, MEN1, MLH1, MSH2, MSH3, MSH6, MUTYH, NBN, NF1, NHTL1, PALB2, PDGFRA, PMS2, POLD1, POLE, PTEN, RAD50, RAD51C, RAD51D, SDHB, SDHC, SDHD, SMAD4, SMARCA4. STK11, TP53, TSC1, TSC2, and VHL.  The following genes were evaluated for sequence changes only: SDHA and HOXB13 c.251G>A variant only. The report date is November 27, 2017.     01/08/2018 Imaging    CT CAP W Contrast 01/08/18  IMPRESSION: 1. Overall, there appears to be slight progression of disease, as evidenced by slight growth of the primary pancreatic lesion. While some of the previously noted hepatic lesions appear to have resolved or gotten smaller, others appear to be new or are slightly larger than the prior examination, as detailed above. 2. Several unusual appearing pulmonary nodules scattered throughout the lungs bilaterally, some of which are solid while others are more subsolid in appearance. Although these could be infectious or inflammatory in etiology, the possibility of developing metastatic disease in the lungs should be considered, and close attention on follow-up studies is recommended. 3. Aortic atherosclerosis. 4. Additional incidental findings, as above. Aortic Atherosclerosis  (ICD10-I70.0).  02/04/2018 - 04/15/2018 Chemotherapy    The patient had leucovorin 544 mg in dextrose 5 % 250 mL infusion, 400 mg/m2 = 544 mg, Intravenous,  Once, 5 of 6 cycles Administration: 544 mg (02/05/2018), 544 mg (02/19/2018), 544 mg (03/05/2018), 544 mg (03/21/2018), 544 mg (04/02/2018) fluorouracil (ADRUCIL) 3,250 mg in sodium chloride 0.9 % 85 mL chemo infusion, 2,400 mg/m2 = 3,250 mg, Intravenous, 1 Day/Dose, 5 of 6 cycles Administration: 3,250 mg (02/05/2018), 3,250 mg (02/19/2018), 3,250 mg (03/05/2018), 3,250 mg (03/21/2018), 3,250 mg (04/02/2018) irinotecan LIPOSOME (ONIVYDE) 95 mg in sodium chloride 0.9 % 500 mL chemo infusion, 94.6 mg, Intravenous, Once, 5 of 6 cycles Administration: 95 mg (02/05/2018), 95 mg (02/19/2018), 95 mg (03/05/2018), 95 mg (03/21/2018), 95 mg (04/02/2018)  for chemotherapy treatment.     04/12/2018 Imaging    IMPRESSION: 1. Interval increase in size and development of multiple new low-attenuation lesions throughout the liver 2. Slight interval increase in size of primary pancreatic lesion. 3. Previously described pulmonary nodules are decreased in size or resolved when compared to prior exam, potentially resolving infectious/inflammatory process. 4. Interval increase in size of sclerotic lesion within the L2 vertebral body, potentially representing osseous metastatic disease.    04/16/2018 -  Chemotherapy    The patient had palonosetron (ALOXI) injection 0.25 mg, 0.25 mg, Intravenous,  Once, 0 of 4 cycles leucovorin 528 mg in dextrose 5 % 250 mL infusion, 400 mg/m2, Intravenous,  Once, 0 of 4 cycles oxaliplatin (ELOXATIN) 110 mg in dextrose 5 % 500 mL chemo infusion, 85 mg/m2, Intravenous,  Once, 0 of 4 cycles fluorouracil (ADRUCIL) chemo injection 550 mg, 400 mg/m2, Intravenous,  Once, 0 of 4 cycles fluorouracil (ADRUCIL) 3,150 mg in sodium chloride 0.9 % 87 mL chemo infusion, 2,400 mg/m2, Intravenous, 1 Day/Dose, 0 of 4 cycles  for chemotherapy treatment.     CURRENT THERAPY:  Second line 5-fu pump and liposomal irinotecan every 2 weeks started on 02/05/2018, discontinued 04/17/18 due to disease progression. PENDING 3rd line FOLFOX  INTERVAL HISTORY: Tammy Adams returns for follow up and chemo as scheduled. She completed cycle 5 liposomal-irinotecan and 5FU on 04/02/18. She underwent restaging CT. She had a bad week last week with frequent diarrhea increased from baseline that only resolved this morning. She ran out of lomotil for 1 week, imodium alone was not effective. She got lomotil supply on 8/26. Had 3-4 episodes of diarrhea yesterday. Had formed stool this morning. She had upper abdominal pain she attributes to painful gas. Despite this she continued to feel good physically and mentally. She has a positive outlook. marinol helps her appetite and 15 mg mirtazapine helps depression. Denies n/v/c, fever, chills, cough, chest pain, or dyspnea. Denies back pain or new pain. Intermittent numbness to right foot at night is unchanged.    MEDICAL HISTORY:  Past Medical History:  Diagnosis Date  . Anxiety   . Family history of colon cancer   . Family history of pancreatic cancer   . Family history of prostate cancer     SURGICAL HISTORY: Past Surgical History:  Procedure Laterality Date  . IR FLUORO GUIDE PORT INSERTION RIGHT  07/24/2017  . IR US GUIDE VASC ACCESS RIGHT  07/24/2017    I have reviewed the social history and family history with the patient and they are unchanged from previous note.  ALLERGIES:  has No Known Allergies.  MEDICATIONS:  Current Outpatient Medications  Medication Sig Dispense Refill  . diphenoxylate-atropine (LOMOTIL) 2.5-0.025 MG tablet TAKE 1 TABLET BY MOUTH 4  TIMES DAILY AS NEEDED FOR  DIARRHEA  OR  LOOSE  STOOLS 30 tablet 2  . dronabinol (MARINOL) 2.5 MG capsule Take 1 capsule (2.5 mg total) by mouth 2 (two) times daily before a meal. 60 capsule 0  . LORazepam (ATIVAN) 1 MG tablet Take 1 mg 2 (two) times daily by  mouth.    . mirtazapine (REMERON) 15 MG tablet Take 1 tablet (15 mg total) by mouth at bedtime. 30 tablet 2  . Multiple Vitamin (MULTIVITAMIN WITH MINERALS) TABS tablet Take 1 tablet daily by mouth. One-A-Day for Women    . potassium chloride SA (K-DUR,KLOR-CON) 20 MEQ tablet Take 1 tablet (20 mEq total) by mouth daily. 40 tablet 1   No current facility-administered medications for this visit.     PHYSICAL EXAMINATION: ECOG PERFORMANCE STATUS: 2 - Symptomatic, <50% confined to bed  Vitals:   04/17/18 0843  BP: 99/61  Pulse: (!) 56  Resp: 18  Temp: 97.6 F (36.4 C)  SpO2: 100%   Filed Weights   04/17/18 0843  Weight: 83 lb 8 oz (37.9 kg)    GENERAL:alert, no distress and comfortable SKIN: skin color, texture, turgor are normal, no rashes or significant lesions EYES:  sclera clear OROPHARYNX:no thrush or ulcers  LYMPH:  no palpable cervical or supraclavicular lymphadenopathy  LUNGS: clear to auscultation with normal breathing effort HEART: regular rate & rhythm and no murmurs and no lower extremity edema ABDOMEN:abdomen soft and normal bowel sounds. Mild epigastric tenderness   Musculoskeletal:no cyanosis of digits and no clubbing  NEURO: alert & oriented x 3 with fluent speech, no focal motor/sensory deficits PAC without erythema   LABORATORY DATA:  I have reviewed the data as listed CBC Latest Ref Rng & Units 04/17/2018 04/02/2018 03/21/2018  WBC 3.9 - 10.3 K/uL 6.1 5.0 6.5  Hemoglobin 11.6 - 15.9 g/dL 12.1 11.1(L) 11.4(L)  Hematocrit 34.8 - 46.6 % 36.2 33.0(L) 33.9(L)  Platelets 145 - 400 K/uL 145 137(L) 139(L)     CMP Latest Ref Rng & Units 04/17/2018 04/02/2018 03/21/2018  Glucose 70 - 99 mg/dL 106(H) 85 87  BUN 8 - 23 mg/dL _0 Creatinine 0.44 - 1.00 mg/dL 0.85 0.85 0.86  Sodium 135 - 145 mmol/L 141 141 141  Potassium 3.5 - 5.1 mmol/L 3.7 3.7 3.8  Chloride 98 - 111 mmol/L 112(H) 114(H) 112(H)  CO2 22 - 32 mmol/L 23 19(L) 22  Calcium 8.9 - 10.3 mg/dL 9.3 8.5(L)  9.1  Total Protein 6.5 - 8.1 g/dL 7.3 6.8 7.2  Total Bilirubin 0.3 - 1.2 mg/dL 0.3 <0.2(L) 0.3  Alkaline Phos 38 - 126 U/L 69 66 61  AST 15 - 41 U/L _1 ALT 0 - 44 U/L _2 RADIOGRAPHIC STUDIES: I have personally reviewed the radiological images as listed and agreed with the findings in the report. No results found.   ASSESSMENT & PLAN:  1. 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 2. Anorexia and weight loss.  Weight stable.  Plan to resume Marinol. 3. Diarrhea 4. Smoking cessation 5. Goals of care 6. Peripheral neuropathy involving feet, grade 1  Tammy Adams appears stable. She completed 5 cycles 2nd line liposomal-irinotecan and 5FU. She experienced increased diarrhea and abdominal pain over baseline, which is resolving with lomotil. She has lost 2 lbs. BP is  soft. Given mild dehydration will support with IVF today and postpone treatment to next week, which is also her preference.   The patient was seen with Dr. Burr Medico. We reviewed restaging CT which shows disease progression in the liver and slight enlargement of the pancreatic mass. Dr. Burr Medico recommends to discontinue liposomal-irinotecan and 5FU and begin 3rd line FOLFOX, without 5FU bolus for first cycle. Side effects including increased neuropathy and cold sensitivity were reviewed with the patient. She agrees to proceed, but prefers to delay to next week to continue recovering from diarrhea. We will see her back in f/u with cycle 2. Plan to restage after 4-5 cycles.   PLAN: -Labs and imaging reviewed -Discontinue liposomal-irinotecan and 5FU due to disease progression -Begin 3rd line FOLFOX next week, continue q2 weeks, omit 5FU bolus with 1st cycle -IVF today -Refilled mirtazapine 15 mg qHS and oral K 20 MEq 1 tab daily   All  questions were answered. The patient knows to call the clinic with any problems, questions or concerns. No barriers to learning was detected.     Alla Feeling, NP 04/17/18   Addendum  I have seen the patient, examined her. I agree with the assessment and and plan and have edited the notes.   I have reviewed Tammy Adams's restaging CT scan findings with her in detail, unfortunately it showed disease progression in her liver.  She is clinically doing well, except moderate diarrhea since last week.  We will hold chemotherapy today to allow her to recover better.  I recommend saline chemotherapy with FOLFOX.  Potential benefit and side effects discussed with her, she agrees to proceed, will start next week.  We discussed that her cancer is more difficult to since she has multiple chemo regimens, expressed her wish to continue chemotherapy.  I encouraged her to eat well and remains to be physically active. I will call her brother to update her condition.   Truitt Merle  04/17/2018

## 2018-04-17 ENCOUNTER — Encounter: Payer: Self-pay | Admitting: Nurse Practitioner

## 2018-04-17 ENCOUNTER — Inpatient Hospital Stay: Payer: Medicare Other

## 2018-04-17 ENCOUNTER — Inpatient Hospital Stay (HOSPITAL_BASED_OUTPATIENT_CLINIC_OR_DEPARTMENT_OTHER): Payer: Medicare Other | Admitting: Nurse Practitioner

## 2018-04-17 ENCOUNTER — Inpatient Hospital Stay: Payer: Medicare Other | Admitting: Nutrition

## 2018-04-17 ENCOUNTER — Telehealth: Payer: Self-pay | Admitting: Hematology

## 2018-04-17 VITALS — BP 99/61 | HR 56 | Temp 97.6°F | Resp 18 | Ht 64.0 in | Wt 83.5 lb

## 2018-04-17 DIAGNOSIS — Z800 Family history of malignant neoplasm of digestive organs: Secondary | ICD-10-CM

## 2018-04-17 DIAGNOSIS — T451X5S Adverse effect of antineoplastic and immunosuppressive drugs, sequela: Secondary | ICD-10-CM

## 2018-04-17 DIAGNOSIS — G620 Drug-induced polyneuropathy: Secondary | ICD-10-CM | POA: Diagnosis not present

## 2018-04-17 DIAGNOSIS — F12920 Cannabis use, unspecified with intoxication, uncomplicated: Secondary | ICD-10-CM

## 2018-04-17 DIAGNOSIS — Z79899 Other long term (current) drug therapy: Secondary | ICD-10-CM

## 2018-04-17 DIAGNOSIS — C787 Secondary malignant neoplasm of liver and intrahepatic bile duct: Principal | ICD-10-CM

## 2018-04-17 DIAGNOSIS — I700 Atherosclerosis of aorta: Secondary | ICD-10-CM

## 2018-04-17 DIAGNOSIS — R5383 Other fatigue: Secondary | ICD-10-CM

## 2018-04-17 DIAGNOSIS — C259 Malignant neoplasm of pancreas, unspecified: Secondary | ICD-10-CM

## 2018-04-17 DIAGNOSIS — Z5111 Encounter for antineoplastic chemotherapy: Secondary | ICD-10-CM | POA: Diagnosis not present

## 2018-04-17 DIAGNOSIS — C258 Malignant neoplasm of overlapping sites of pancreas: Secondary | ICD-10-CM | POA: Diagnosis not present

## 2018-04-17 DIAGNOSIS — R630 Anorexia: Secondary | ICD-10-CM

## 2018-04-17 DIAGNOSIS — F17210 Nicotine dependence, cigarettes, uncomplicated: Secondary | ICD-10-CM

## 2018-04-17 DIAGNOSIS — R197 Diarrhea, unspecified: Secondary | ICD-10-CM

## 2018-04-17 LAB — COMPREHENSIVE METABOLIC PANEL WITH GFR
ALT: 25 U/L (ref 0–44)
AST: 23 U/L (ref 15–41)
Albumin: 3.9 g/dL (ref 3.5–5.0)
Alkaline Phosphatase: 69 U/L (ref 38–126)
Anion gap: 6 (ref 5–15)
BUN: 9 mg/dL (ref 8–23)
CO2: 23 mmol/L (ref 22–32)
Calcium: 9.3 mg/dL (ref 8.9–10.3)
Chloride: 112 mmol/L — ABNORMAL HIGH (ref 98–111)
Creatinine, Ser: 0.85 mg/dL (ref 0.44–1.00)
GFR calc Af Amer: >60 mL/min (ref >60)
GFR calc non Af Amer: >60 mL/min (ref >60)
Glucose, Bld: 106 mg/dL — ABNORMAL HIGH (ref 70–99)
Potassium: 3.7 mmol/L (ref 3.5–5.1)
Sodium: 141 mmol/L (ref 135–145)
Total Bilirubin: 0.3 mg/dL (ref 0.3–1.2)
Total Protein: 7.3 g/dL (ref 6.5–8.1)

## 2018-04-17 LAB — CBC WITH DIFFERENTIAL/PLATELET
BASOS ABS: 0 10*3/uL (ref 0.0–0.1)
BASOS PCT: 1 %
EOS ABS: 0.1 10*3/uL (ref 0.0–0.5)
Eosinophils Relative: 2 %
HEMATOCRIT: 36.2 % (ref 34.8–46.6)
HEMOGLOBIN: 12.1 g/dL (ref 11.6–15.9)
Lymphocytes Relative: 21 %
Lymphs Abs: 1.3 10*3/uL (ref 0.9–3.3)
MCH: 35 pg — ABNORMAL HIGH (ref 25.1–34.0)
MCHC: 33.4 g/dL (ref 31.5–36.0)
MCV: 105 fL — ABNORMAL HIGH (ref 79.5–101.0)
Monocytes Absolute: 0.4 10*3/uL (ref 0.1–0.9)
Monocytes Relative: 6 %
NEUTROS ABS: 4.3 10*3/uL (ref 1.5–6.5)
NEUTROS PCT: 70 %
Platelets: 145 10*3/uL (ref 145–400)
RBC: 3.45 MIL/uL — ABNORMAL LOW (ref 3.70–5.45)
RDW: 15.4 % — ABNORMAL HIGH (ref 11.2–14.5)
WBC: 6.1 10*3/uL (ref 3.9–10.3)

## 2018-04-17 MED ORDER — SODIUM CHLORIDE 0.9 % IV SOLN
Freq: Once | INTRAVENOUS | Status: AC
  Administered 2018-04-17: 10:00:00 via INTRAVENOUS
  Filled 2018-04-17: qty 250

## 2018-04-17 MED ORDER — HEPARIN SOD (PORK) LOCK FLUSH 100 UNIT/ML IV SOLN
500.0000 [IU] | Freq: Once | INTRAVENOUS | Status: AC
  Administered 2018-04-17: 500 [IU] via INTRAVENOUS
  Filled 2018-04-17: qty 5

## 2018-04-17 MED ORDER — SODIUM CHLORIDE 0.9% FLUSH
10.0000 mL | Freq: Once | INTRAVENOUS | Status: AC
  Administered 2018-04-17: 10 mL
  Filled 2018-04-17: qty 10

## 2018-04-17 MED ORDER — POTASSIUM CHLORIDE CRYS ER 20 MEQ PO TBCR: 20.0000 meq | EXTENDED_RELEASE_TABLET | Freq: Every day | ORAL | 1 refills | Status: DC

## 2018-04-17 MED ORDER — SODIUM CHLORIDE 0.9% FLUSH
10.0000 mL | Freq: Once | INTRAVENOUS | Status: AC
  Administered 2018-04-17: 10 mL via INTRAVENOUS
  Filled 2018-04-17: qty 10

## 2018-04-17 MED ORDER — MIRTAZAPINE 15 MG PO TABS: 15.0000 mg | ORAL_TABLET | Freq: Every day | ORAL | 2 refills | Status: DC

## 2018-04-17 NOTE — Progress Notes (Signed)
Nutrition follow-up completed with patient being treated for metastatic pancreas cancer. Weight was decreased and documented as 83 pounds. Patient is just receiving IV fluids today. Reports she is back on Marinol and feels her appetite is improving. She is not using Creon at this time secondary to expense Patient reports she had one normal stool today. She continues to drink boost plus, approximately 2 bottles daily.  Nutrition diagnosis: Unintended weight loss continues.  Intervention: Recommended patient increase boost plus 3 times daily. Provided samples and coupons. Educated patient to see if she can get discounted Creon and resume her MD prescription. Educated patient to continue Imodium/Lomotil as needed per MD recommendations. Teach back method use  Monitoring, evaluation, goals: Patient will tolerate increased calories and protein to minimize further weight loss.  Next visit: Wednesday, October 9 during infusion.  **Disclaimer: This note was dictated with voice recognition software. Similar sounding words can inadvertently be transcribed and this note may contain transcription errors which may not have been corrected upon publication of note.**

## 2018-04-17 NOTE — Patient Instructions (Signed)
Dehydration, Adult Dehydration is when there is not enough fluid or water in your body. This happens when you lose more fluids than you take in. Dehydration can range from mild to very bad. It should be treated right away to keep it from getting very bad. Symptoms of mild dehydration may include:  Thirst.  Dry lips.  Slightly dry mouth.  Dry, warm skin.  Dizziness. Symptoms of moderate dehydration may include:  Very dry mouth.  Muscle cramps.  Dark pee (urine). Pee may be the color of tea.  Your body making less pee.  Your eyes making fewer tears.  Heartbeat that is uneven or faster than normal (palpitations).  Headache.  Light-headedness, especially when you stand up from sitting.  Fainting (syncope). Symptoms of very bad dehydration may include:  Changes in skin, such as: ? Cold and clammy skin. ? Blotchy (mottled) or pale skin. ? Skin that does not quickly return to normal after being lightly pinched and let go (poor skin turgor).  Changes in body fluids, such as: ? Feeling very thirsty. ? Your eyes making fewer tears. ? Not sweating when body temperature is high, such as in hot weather. ? Your body making very little pee.  Changes in vital signs, such as: ? Weak pulse. ? Pulse that is more than 100 beats a minute when you are sitting still. ? Fast breathing. ? Low blood pressure.  Other changes, such as: ? Sunken eyes. ? Cold hands and feet. ? Confusion. ? Lack of energy (lethargy). ? Trouble waking up from sleep. ? Short-term weight loss. ? Unconsciousness. Follow these instructions at home:  If told by your doctor, drink an ORS: ? Make an ORS by using instructions on the package. ? Start by drinking small amounts, about  cup (120 mL) every 5-10 minutes. ? Slowly drink more until you have had the amount that your doctor said to have.  Drink enough clear fluid to keep your pee clear or pale yellow. If you were told to drink an ORS, finish the ORS  first, then start slowly drinking clear fluids. Drink fluids such as: ? Water. Do not drink only water by itself. Doing that can make the salt (sodium) level in your body get too low (hyponatremia). ? Ice chips. ? Fruit juice that you have added water to (diluted). ? Low-calorie sports drinks.  Avoid: ? Alcohol. ? Drinks that have a lot of sugar. These include high-calorie sports drinks, fruit juice that does not have water added, and soda. ? Caffeine. ? Foods that are greasy or have a lot of fat or sugar.  Take over-the-counter and prescription medicines only as told by your doctor.  Do not take salt tablets. Doing that can make the salt level in your body get too high (hypernatremia).  Eat foods that have minerals (electrolytes). Examples include bananas, oranges, potatoes, tomatoes, and spinach.  Keep all follow-up visits as told by your doctor. This is important. Contact a doctor if:  You have belly (abdominal) pain that: ? Gets worse. ? Stays in one area (localizes).  You have a rash.  You have a stiff neck.  You get angry or annoyed more easily than normal (irritability).  You are more sleepy than normal.  You have a harder time waking up than normal.  You feel: ? Weak. ? Dizzy. ? Very thirsty.  You have peed (urinated) only a small amount of very dark pee during 6-8 hours. Get help right away if:  You have symptoms of   very bad dehydration.  You cannot drink fluids without throwing up (vomiting).  Your symptoms get worse with treatment.  You have a fever.  You have a very bad headache.  You are throwing up or having watery poop (diarrhea) and it: ? Gets worse. ? Does not go away.  You have blood or something green (bile) in your throw-up.  You have blood in your poop (stool). This may cause poop to look black and tarry.  You have not peed in 6-8 hours.  You pass out (faint).  Your heart rate when you are sitting still is more than 100 beats a  minute.  You have trouble breathing. This information is not intended to replace advice given to you by your health care provider. Make sure you discuss any questions you have with your health care provider. Document Released: 06/03/2009 Document Revised: 02/25/2016 Document Reviewed: 10/01/2015 Elsevier Interactive Patient Education  2018 [employer]  

## 2018-04-17 NOTE — Telephone Encounter (Signed)
Appts scheduled AVS/Calendar printed per 8/28 los °

## 2018-04-17 NOTE — Patient Instructions (Signed)
Implanted Port Home Guide An implanted port is a type of central line that is placed under the skin. Central lines are used to provide IV access when treatment or nutrition needs to be given through a person's veins. Implanted ports are used for long-term IV access. An implanted port may be placed because:  You need IV medicine that would be irritating to the small veins in your hands or arms.  You need long-term IV medicines, such as antibiotics.  You need IV nutrition for a long period.  You need frequent blood draws for lab tests.  You need dialysis.  Implanted ports are usually placed in the chest area, but they can also be placed in the upper arm, the abdomen, or the leg. An implanted port has two main parts:  Reservoir. The reservoir is round and will appear as a small, raised area under your skin. The reservoir is the part where a needle is inserted to give medicines or draw blood.  Catheter. The catheter is a thin, flexible tube that extends from the reservoir. The catheter is placed into a large vein. Medicine that is inserted into the reservoir goes into the catheter and then into the vein.  How will I care for my incision site? Do not get the incision site wet. Bathe or shower as directed by your health care provider. How is my port accessed? Special steps must be taken to access the port:  Before the port is accessed, a numbing cream can be placed on the skin. This helps numb the skin over the port site.  Your health care provider uses a sterile technique to access the port. ? Your health care provider must put on a mask and sterile gloves. ? The skin over your port is cleaned carefully with an antiseptic and allowed to dry. ? The port is gently pinched between sterile gloves, and a needle is inserted into the port.  Only "non-coring" port needles should be used to access the port. Once the port is accessed, a blood return should be checked. This helps ensure that the port  is in the vein and is not clogged.  If your port needs to remain accessed for a constant infusion, a clear (transparent) bandage will be placed over the needle site. The bandage and needle will need to be changed every week, or as directed by your health care provider.  Keep the bandage covering the needle clean and dry. Do not get it wet. Follow your health care provider's instructions on how to take a shower or bath while the port is accessed.  If your port does not need to stay accessed, no bandage is needed over the port.  What is flushing? Flushing helps keep the port from getting clogged. Follow your health care provider's instructions on how and when to flush the port. Ports are usually flushed with saline solution or a medicine called heparin. The need for flushing will depend on how the port is used.  If the port is used for intermittent medicines or blood draws, the port will need to be flushed: ? After medicines have been given. ? After blood has been drawn. ? As part of routine maintenance.  If a constant infusion is running, the port may not need to be flushed.  How long will my port stay implanted? The port can stay in for as long as your health care provider thinks it is needed. When it is time for the port to come out, surgery will be   done to remove it. The procedure is similar to the one performed when the port was put in. When should I seek immediate medical care? When you have an implanted port, you should seek immediate medical care if:  You notice a bad smell coming from the incision site.  You have swelling, redness, or drainage at the incision site.  You have more swelling or pain at the port site or the surrounding area.  You have a fever that is not controlled with medicine.  This information is not intended to replace advice given to you by your health care provider. Make sure you discuss any questions you have with your health care provider. Document  Released: 08/07/2005 Document Revised: 01/13/2016 Document Reviewed: 04/14/2013 Elsevier Interactive Patient Education  2017 Elsevier Inc.  

## 2018-04-26 ENCOUNTER — Other Ambulatory Visit: Payer: Self-pay | Admitting: Hematology

## 2018-04-26 ENCOUNTER — Inpatient Hospital Stay: Payer: Medicare Other | Attending: Hematology

## 2018-04-26 ENCOUNTER — Inpatient Hospital Stay: Payer: Medicare Other

## 2018-04-26 DIAGNOSIS — Z79899 Other long term (current) drug therapy: Secondary | ICD-10-CM | POA: Diagnosis not present

## 2018-04-26 DIAGNOSIS — R5383 Other fatigue: Secondary | ICD-10-CM | POA: Diagnosis not present

## 2018-04-26 DIAGNOSIS — J439 Emphysema, unspecified: Secondary | ICD-10-CM | POA: Diagnosis not present

## 2018-04-26 DIAGNOSIS — R634 Abnormal weight loss: Secondary | ICD-10-CM | POA: Insufficient documentation

## 2018-04-26 DIAGNOSIS — R197 Diarrhea, unspecified: Secondary | ICD-10-CM | POA: Diagnosis not present

## 2018-04-26 DIAGNOSIS — I700 Atherosclerosis of aorta: Secondary | ICD-10-CM | POA: Diagnosis not present

## 2018-04-26 DIAGNOSIS — Z8042 Family history of malignant neoplasm of prostate: Secondary | ICD-10-CM | POA: Diagnosis not present

## 2018-04-26 DIAGNOSIS — T451X5S Adverse effect of antineoplastic and immunosuppressive drugs, sequela: Secondary | ICD-10-CM | POA: Diagnosis not present

## 2018-04-26 DIAGNOSIS — Z800 Family history of malignant neoplasm of digestive organs: Secondary | ICD-10-CM | POA: Insufficient documentation

## 2018-04-26 DIAGNOSIS — C259 Malignant neoplasm of pancreas, unspecified: Secondary | ICD-10-CM

## 2018-04-26 DIAGNOSIS — C787 Secondary malignant neoplasm of liver and intrahepatic bile duct: Principal | ICD-10-CM

## 2018-04-26 DIAGNOSIS — Z452 Encounter for adjustment and management of vascular access device: Secondary | ICD-10-CM | POA: Insufficient documentation

## 2018-04-26 DIAGNOSIS — Z5111 Encounter for antineoplastic chemotherapy: Secondary | ICD-10-CM | POA: Diagnosis not present

## 2018-04-26 DIAGNOSIS — R630 Anorexia: Secondary | ICD-10-CM | POA: Insufficient documentation

## 2018-04-26 DIAGNOSIS — G620 Drug-induced polyneuropathy: Secondary | ICD-10-CM | POA: Diagnosis not present

## 2018-04-26 DIAGNOSIS — C258 Malignant neoplasm of overlapping sites of pancreas: Secondary | ICD-10-CM | POA: Diagnosis not present

## 2018-04-26 LAB — CBC WITH DIFFERENTIAL/PLATELET
BASOS ABS: 0.1 10*3/uL (ref 0.0–0.1)
Basophils Relative: 1 %
EOS PCT: 1 %
Eosinophils Absolute: 0 10*3/uL (ref 0.0–0.5)
HCT: 34.4 % — ABNORMAL LOW (ref 34.8–46.6)
Hemoglobin: 11.4 g/dL — ABNORMAL LOW (ref 11.6–15.9)
LYMPHS ABS: 1.2 10*3/uL (ref 0.9–3.3)
LYMPHS PCT: 17 %
MCH: 34.8 pg — AB (ref 25.1–34.0)
MCHC: 33.2 g/dL (ref 31.5–36.0)
MCV: 104.7 fL — AB (ref 79.5–101.0)
MONO ABS: 0.4 10*3/uL (ref 0.1–0.9)
Monocytes Relative: 5 %
Neutro Abs: 5.3 10*3/uL (ref 1.5–6.5)
Neutrophils Relative %: 76 %
PLATELETS: 182 10*3/uL (ref 145–400)
RBC: 3.29 MIL/uL — AB (ref 3.70–5.45)
RDW: 14.5 % (ref 11.2–14.5)
WBC: 7 10*3/uL (ref 3.9–10.3)

## 2018-04-26 LAB — COMPREHENSIVE METABOLIC PANEL
ALK PHOS: 66 U/L (ref 38–126)
ALT: 18 U/L (ref 0–44)
AST: 17 U/L (ref 15–41)
Albumin: 3.8 g/dL (ref 3.5–5.0)
Anion gap: 6 (ref 5–15)
BILIRUBIN TOTAL: 0.3 mg/dL (ref 0.3–1.2)
BUN: 12 mg/dL (ref 8–23)
CO2: 25 mmol/L (ref 22–32)
CREATININE: 0.81 mg/dL (ref 0.44–1.00)
Calcium: 9.5 mg/dL (ref 8.9–10.3)
Chloride: 108 mmol/L (ref 98–111)
GFR calc Af Amer: >60 mL/min (ref >60)
Glucose, Bld: 88 mg/dL (ref 70–99)
Potassium: 4.3 mmol/L (ref 3.5–5.1)
Sodium: 139 mmol/L (ref 135–145)
TOTAL PROTEIN: 7.1 g/dL (ref 6.5–8.1)

## 2018-04-26 MED ORDER — HEPARIN SOD (PORK) LOCK FLUSH 100 UNIT/ML IV SOLN
250.0000 [IU] | Freq: Once | INTRAVENOUS | Status: AC
  Administered 2018-04-26: 250 [IU]
  Filled 2018-04-26: qty 5

## 2018-04-26 MED ORDER — SODIUM CHLORIDE 0.9% FLUSH
10.0000 mL | Freq: Once | INTRAVENOUS | Status: AC
  Administered 2018-04-26: 10 mL
  Filled 2018-04-26: qty 10

## 2018-04-27 ENCOUNTER — Inpatient Hospital Stay: Payer: Medicare Other

## 2018-04-27 VITALS — BP 107/70 | HR 73 | Temp 98.6°F | Resp 18

## 2018-04-27 DIAGNOSIS — C258 Malignant neoplasm of overlapping sites of pancreas: Secondary | ICD-10-CM | POA: Diagnosis not present

## 2018-04-27 DIAGNOSIS — R630 Anorexia: Secondary | ICD-10-CM | POA: Diagnosis not present

## 2018-04-27 DIAGNOSIS — R634 Abnormal weight loss: Secondary | ICD-10-CM | POA: Diagnosis not present

## 2018-04-27 DIAGNOSIS — Z452 Encounter for adjustment and management of vascular access device: Secondary | ICD-10-CM | POA: Diagnosis not present

## 2018-04-27 DIAGNOSIS — C787 Secondary malignant neoplasm of liver and intrahepatic bile duct: Secondary | ICD-10-CM | POA: Diagnosis not present

## 2018-04-27 DIAGNOSIS — Z7189 Other specified counseling: Secondary | ICD-10-CM

## 2018-04-27 DIAGNOSIS — Z5111 Encounter for antineoplastic chemotherapy: Secondary | ICD-10-CM | POA: Diagnosis not present

## 2018-04-27 DIAGNOSIS — C259 Malignant neoplasm of pancreas, unspecified: Secondary | ICD-10-CM

## 2018-04-27 MED ORDER — DEXAMETHASONE SODIUM PHOSPHATE 10 MG/ML IJ SOLN
INTRAMUSCULAR | Status: AC
  Filled 2018-04-27: qty 1

## 2018-04-27 MED ORDER — LEUCOVORIN CALCIUM INJECTION 350 MG
400.0000 mg/m2 | Freq: Once | INTRAVENOUS | Status: AC
  Administered 2018-04-27: 528 mg via INTRAVENOUS
  Filled 2018-04-27: qty 26.4

## 2018-04-27 MED ORDER — DEXAMETHASONE SODIUM PHOSPHATE 10 MG/ML IJ SOLN
10.0000 mg | Freq: Once | INTRAMUSCULAR | Status: AC
  Administered 2018-04-27: 10 mg via INTRAVENOUS

## 2018-04-27 MED ORDER — DEXTROSE 5 % IV SOLN
Freq: Once | INTRAVENOUS | Status: AC
  Administered 2018-04-27: 09:00:00 via INTRAVENOUS
  Filled 2018-04-27: qty 250

## 2018-04-27 MED ORDER — PALONOSETRON HCL INJECTION 0.25 MG/5ML
0.2500 mg | Freq: Once | INTRAVENOUS | Status: AC
  Administered 2018-04-27: 0.25 mg via INTRAVENOUS

## 2018-04-27 MED ORDER — SODIUM CHLORIDE 0.9 % IV SOLN
2400.0000 mg/m2 | INTRAVENOUS | Status: DC
  Administered 2018-04-27: 3150 mg via INTRAVENOUS
  Filled 2018-04-27: qty 63

## 2018-04-27 MED ORDER — PALONOSETRON HCL INJECTION 0.25 MG/5ML
INTRAVENOUS | Status: AC
  Filled 2018-04-27: qty 5

## 2018-04-27 MED ORDER — OXALIPLATIN CHEMO INJECTION 100 MG/20ML
85.0000 mg/m2 | Freq: Once | INTRAVENOUS | Status: AC
  Administered 2018-04-27: 110 mg via INTRAVENOUS
  Filled 2018-04-27: qty 2

## 2018-04-27 NOTE — Patient Instructions (Addendum)
[facility] Discharge Instructions for Patients Receiving Chemotherapy  Today you received the following chemotherapy agents :  Oxaliplatin, Leucovorin, Fluorouracil.  To help prevent nausea and vomiting after your treatment, we encourage you to take your nausea medication as prescribed.  DO NOT EAT OR DRINK ANYTHING COLD FOR  3 - 5 DAYS. DO NOT EAT GREASY NOR SPICY FOODS.  DO DRINK LOTS OF FLUIDS AS TOLERATED -  NOTHING COLD FOR  3 - 5 DAYS.   If you develop nausea and vomiting that is not controlled by your nausea medication, call the clinic.   BELOW ARE SYMPTOMS THAT SHOULD BE REPORTED IMMEDIATELY:  *FEVER GREATER THAN 100.5 F  *CHILLS WITH OR WITHOUT FEVER  NAUSEA AND VOMITING THAT IS NOT CONTROLLED WITH YOUR NAUSEA MEDICATION  *UNUSUAL SHORTNESS OF BREATH  *UNUSUAL BRUISING OR BLEEDING  TENDERNESS IN MOUTH AND THROAT WITH OR WITHOUT PRESENCE OF ULCERS  *URINARY PROBLEMS  *BOWEL PROBLEMS  UNUSUAL RASH Items with * indicate a potential emergency and should be followed up as soon as possible.  Feel free to call the clinic should you have any questions or concerns. The clinic phone number is [phone].  Please show the [facility] at check-in to the Emergency Department and triage nurse.   Oxaliplatin Injection What is this medicine? OXALIPLATIN (ox AL i PLA tin) is a chemotherapy drug. It targets fast dividing cells, like cancer cells, and causes these cells to die. This medicine is used to treat cancers of the colon and rectum, and many other cancers. This medicine may be used for other purposes; ask your health care provider or pharmacist if you have questions. COMMON BRAND NAME(S): Eloxatin What should I tell my health care provider before I take this medicine? They need to know if you have any of these conditions: -kidney disease -an unusual or allergic reaction to oxaliplatin, other chemotherapy, other medicines, foods, dyes, or  preservatives -pregnant or trying to get pregnant -breast-feeding How should I use this medicine? This drug is given as an infusion into a vein. It is administered in a hospital or clinic by a specially trained health care professional. Talk to your pediatrician regarding the use of this medicine in children. Special care may be needed. Overdosage: If you think you have taken too much of this medicine contact a poison control center or emergency room at once. NOTE: This medicine is only for you. Do not share this medicine with others. What if I miss a dose? It is important not to miss a dose. Call your doctor or health care professional if you are unable to keep an appointment. What may interact with this medicine? -medicines to increase blood counts like filgrastim, pegfilgrastim, sargramostim -probenecid -some antibiotics like amikacin, gentamicin, neomycin, polymyxin B, streptomycin, tobramycin -zalcitabine Talk to your doctor or health care professional before taking any of these medicines: -acetaminophen -aspirin -ibuprofen -ketoprofen -naproxen This list may not describe all possible interactions. Give your health care provider a list of all the medicines, herbs, non-prescription drugs, or dietary supplements you use. Also tell them if you smoke, drink alcohol, or use illegal drugs. Some items may interact with your medicine. What should I watch for while using this medicine? Your condition will be monitored carefully while you are receiving this medicine. You will need important blood work done while you are taking this medicine. This medicine can make you more sensitive to cold. Do not drink cold drinks or use ice. Cover exposed skin before coming  in contact with cold temperatures or cold objects. When out in cold weather wear warm clothing and cover your mouth and nose to warm the air that goes into your lungs. Tell your doctor if you get sensitive to the cold. This drug may make  you feel generally unwell. This is not uncommon, as chemotherapy can affect healthy cells as well as cancer cells. Report any side effects. Continue your course of treatment even though you feel ill unless your doctor tells you to stop. In some cases, you may be given additional medicines to help with side effects. Follow all directions for their use. Call your doctor or health care professional for advice if you get a fever, chills or sore throat, or other symptoms of a cold or flu. Do not treat yourself. This drug decreases your body's ability to fight infections. Try to avoid being around people who are sick. This medicine may increase your risk to bruise or bleed. Call your doctor or health care professional if you notice any unusual bleeding. Be careful brushing and flossing your teeth or using a toothpick because you may get an infection or bleed more easily. If you have any dental work done, tell your dentist you are receiving this medicine. Avoid taking products that contain aspirin, acetaminophen, ibuprofen, naproxen, or ketoprofen unless instructed by your doctor. These medicines may hide a fever. Do not become pregnant while taking this medicine. Women should inform their doctor if they wish to become pregnant or think they might be pregnant. There is a potential for serious side effects to an unborn child. Talk to your health care professional or pharmacist for more information. Do not breast-feed an infant while taking this medicine. Call your doctor or health care professional if you get diarrhea. Do not treat yourself. What side effects may I notice from receiving this medicine? Side effects that you should report to your doctor or health care professional as soon as possible: -allergic reactions like skin rash, itching or hives, swelling of the face, lips, or tongue -low blood counts - This drug may decrease the number of white blood cells, red blood cells and platelets. You may be at  increased risk for infections and bleeding. -signs of infection - fever or chills, cough, sore throat, pain or difficulty passing urine -signs of decreased platelets or bleeding - bruising, pinpoint red spots on the skin, black, tarry stools, nosebleeds -signs of decreased red blood cells - unusually weak or tired, fainting spells, lightheadedness -breathing problems -chest pain, pressure -cough -diarrhea -jaw tightness -mouth sores -nausea and vomiting -pain, swelling, redness or irritation at the injection site -pain, tingling, numbness in the hands or feet -problems with balance, talking, walking -redness, blistering, peeling or loosening of the skin, including inside the mouth -trouble passing urine or change in the amount of urine Side effects that usually do not require medical attention (report to your doctor or health care professional if they continue or are bothersome): -changes in vision -constipation -hair loss -loss of appetite -metallic taste in the mouth or changes in taste -stomach pain This list may not describe all possible side effects. Call your doctor for medical advice about side effects. You may report side effects to FDA at 1-800-FDA-1088. Where should I keep my medicine? This drug is given in a hospital or clinic and will not be stored at home. NOTE: This sheet is a summary. It may not cover all possible information. If you have questions about this medicine, talk to your doctor,  pharmacist, or health care provider.  2018 Elsevier/Gold Standard (2008-03-03 17:22:47)

## 2018-04-29 ENCOUNTER — Inpatient Hospital Stay: Payer: Medicare Other

## 2018-04-29 VITALS — BP 110/68 | HR 68 | Temp 98.6°F | Resp 17

## 2018-04-29 DIAGNOSIS — C787 Secondary malignant neoplasm of liver and intrahepatic bile duct: Principal | ICD-10-CM

## 2018-04-29 DIAGNOSIS — C259 Malignant neoplasm of pancreas, unspecified: Secondary | ICD-10-CM

## 2018-04-29 DIAGNOSIS — Z7189 Other specified counseling: Secondary | ICD-10-CM

## 2018-04-29 MED ORDER — SODIUM CHLORIDE 0.9% FLUSH
10.0000 mL | INTRAVENOUS | Status: DC | PRN
  Filled 2018-04-29: qty 10

## 2018-04-29 MED ORDER — HEPARIN SOD (PORK) LOCK FLUSH 100 UNIT/ML IV SOLN
500.0000 [IU] | Freq: Once | INTRAVENOUS | Status: DC | PRN
  Filled 2018-04-29: qty 5

## 2018-05-02 ENCOUNTER — Telehealth: Payer: Self-pay

## 2018-05-02 NOTE — Telephone Encounter (Signed)
I called back and updated pt's condition and treatment plan.   Truitt Merle MD

## 2018-05-02 NOTE — Telephone Encounter (Signed)
Patient's brother Dr. Verlene Mayer calls requesting Dr. Burr Medico call him.  He has a few questions regarding her current condition.  Cell: [phone]

## 2018-05-07 ENCOUNTER — Telehealth: Payer: Self-pay

## 2018-05-07 ENCOUNTER — Other Ambulatory Visit: Payer: Self-pay | Admitting: Emergency Medicine

## 2018-05-07 DIAGNOSIS — C787 Secondary malignant neoplasm of liver and intrahepatic bile duct: Principal | ICD-10-CM

## 2018-05-07 DIAGNOSIS — C259 Malignant neoplasm of pancreas, unspecified: Secondary | ICD-10-CM

## 2018-05-07 MED ORDER — DRONABINOL 2.5 MG PO CAPS: 2.5000 mg | ORAL_CAPSULE | Freq: Two times a day (BID) | ORAL | 0 refills | Status: DC

## 2018-05-07 NOTE — Telephone Encounter (Signed)
Spoke with patient offered appointment for IVF this week, patient declined stating she actually is feeling better this afternoon, able to eat and drink without any problems, denies N/V. Instructed her to call back if symptoms worsen, reminded her to keep her appointment next week. She states that she will.

## 2018-05-07 NOTE — Telephone Encounter (Signed)
Please offer her lab/flush and IVF appointment today or this week, and encourage her to keep next week appt for f/u even if we do not give her chemo. Assess n/v/d side effect management, and encourage small frequent meals and nutrition support with boost/ensure (or glucerna if DM).  Thanks, Regan Rakers NP

## 2018-05-07 NOTE — Telephone Encounter (Signed)
Patient calls stating that the new medication is making her very lethargic and she thinks she is loosing weight.  Feels worse than she ever has.   She is eating and drinking well, just feels like no energy.  Does not want to get the same medication (FOLFOX) next week when she comes in.

## 2018-05-14 NOTE — Progress Notes (Signed)
[place]  [phone] [phone]) [phone]  Clinic Follow up Note   Patient Care Team: Wilford Corner, MD as PCP - General (Gastroenterology) Truitt Merle, MD as Consulting Physician (Hematology) 05/15/2018  SUMMARY OF ONCOLOGIC HISTORY:   Pancreatic cancer metastasized to liver ([place])   06/21/2017 Imaging    CT CAP IMPRESSION: 1. Indistinct low-attenuation mass in the uncinate process of the pancreas with dilatation of remainder of the pancreatic duct and some encasement of the SMA. These findings are highly indicative of pancreatic carcinoma. Recommend MRI to assess further. 2. Multiple low-attenuation lesions throughout the right and left lobes of liver most consistent with liver metastasis again MRI may be helpful. 3. Moderate change of abdominal aortic atherosclerosis. 4. Changes of centrilobular and paraseptal emphysema on CT of the chest. No lung lesion is seen.     07/06/2017 Initial Biopsy    Diagnosis Liver, needle/core biopsy, Right lobe - METASTATIC ADENOCARCINOMA.    07/10/2017 Initial Diagnosis    Pancreatic cancer metastasized to liver ([place])    07/25/2017 -  Chemotherapy    Gemcitabine and Abraxane 3 weeks on and 1 week off starting 07/25/17     10/02/2017 Imaging    CT CAP W Contrast 10/02/17 IMPRESSION: 1. Reduced size of the primary pancreatic neoplasm, which still continues to encase the superior mesenteric artery and occlude the superior mesenteric vein. The numerous metastatic liver lesions show some improvement on portal venous phase images, with increase in the central necrosis and slight reduction in size. However, the arterial phase images, which are present today but were not available on the prior exam, demonstrate that there is a significant peripheral rind around each of these lesions and that the portal venous phase images underestimate total lesions size. No findings of metastatic disease to the chest or  skeleton. 2. Severe and worsened cachexia. 3. Low-grade mesenteric edema and a small amount of pelvic ascites. 4. Aortic Atherosclerosis (ICD10-I70.0) and Emphysema (ICD10-J43.9). 5.  Prominent stool throughout the colon favors constipation.    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  The Hereditary Gene Panel offered by [facility] includes sequencing and/or deletion duplication testing of the following 47 genes: APC, ATM, AXIN2, BARD1, BMPR1A, BRCA1, BRCA2, BRIP1, CDH1, CDK4, CDKN2A (p14ARF), CDKN2A (p16INK4a), CHEK2, CTNNA1, DICER1, EPCAM (Deletion/duplication testing only), GREM1 (promoter region deletion/duplication testing only), KIT, MEN1, MLH1, MSH2, MSH3, MSH6, MUTYH, NBN, NF1, NHTL1, PALB2, PDGFRA, PMS2, POLD1, POLE, PTEN, RAD50, RAD51C, RAD51D, SDHB, SDHC, SDHD, SMAD4, SMARCA4. STK11, TP53, TSC1, TSC2, and VHL.  The following genes were evaluated for sequence changes only: SDHA and HOXB13 c.251G>A variant only. The report date is November 27, 2017.     01/08/2018 Imaging    CT CAP W Contrast 01/08/18  IMPRESSION: 1. Overall, there appears to be slight progression of disease, as evidenced by slight growth of the primary pancreatic lesion. While some of the previously noted hepatic lesions appear to have resolved or gotten smaller, others appear to be new or are slightly larger than the prior examination, as detailed above. 2. Several unusual appearing pulmonary nodules scattered throughout the lungs bilaterally, some of which are solid while others are more subsolid in appearance. Although these could be infectious or inflammatory in etiology, the possibility of developing metastatic disease in the lungs should be considered, and close attention on follow-up studies is recommended. 3. Aortic atherosclerosis. 4. Additional incidental findings, as above. Aortic Atherosclerosis  (ICD10-I70.0).  02/04/2018 - 04/15/2018 Chemotherapy    The patient had leucovorin 544 mg in dextrose 5 % 250 mL infusion, 400 mg/m2 = 544 mg, Intravenous,  Once, 5 of 6 cycles Administration: 544 mg (02/05/2018), 544 mg (02/19/2018), 544 mg (03/05/2018), 544 mg (03/21/2018), 544 mg (04/02/2018) fluorouracil (ADRUCIL) 3,250 mg in sodium chloride 0.9 % 85 mL chemo infusion, 2,400 mg/m2 = 3,250 mg, Intravenous, 1 Day/Dose, 5 of 6 cycles Administration: 3,250 mg (02/05/2018), 3,250 mg (02/19/2018), 3,250 mg (03/05/2018), 3,250 mg (03/21/2018), 3,250 mg (04/02/2018) irinotecan LIPOSOME (ONIVYDE) 95 mg in sodium chloride 0.9 % 500 mL chemo infusion, 94.6 mg, Intravenous, Once, 5 of 6 cycles Administration: 95 mg (02/05/2018), 95 mg (02/19/2018), 95 mg (03/05/2018), 95 mg (03/21/2018), 95 mg (04/02/2018)  for chemotherapy treatment.     04/12/2018 Imaging    IMPRESSION: 1. Interval increase in size and development of multiple new low-attenuation lesions throughout the liver 2. Slight interval increase in size of primary pancreatic lesion. 3. Previously described pulmonary nodules are decreased in size or resolved when compared to prior exam, potentially resolving infectious/inflammatory process. 4. Interval increase in size of sclerotic lesion within the L2 vertebral body, potentially representing osseous metastatic disease.    04/16/2018 -  Chemotherapy    The patient had palonosetron (ALOXI) injection 0.25 mg, 0.25 mg, Intravenous,  Once, 1 of 4 cycles Administration: 0.25 mg (04/27/2018) leucovorin 528 mg in dextrose 5 % 250 mL infusion, 400 mg/m2 = 528 mg, Intravenous,  Once, 1 of 4 cycles Administration: 528 mg (04/27/2018) oxaliplatin (ELOXATIN) 110 mg in dextrose 5 % 500 mL chemo infusion, 85 mg/m2 = 110 mg, Intravenous,  Once, 1 of 4 cycles Administration: 110 mg (04/27/2018) fluorouracil (ADRUCIL) 3,150 mg in sodium chloride 0.9 % 87 mL chemo infusion, 2,400 mg/m2 = 3,150 mg, Intravenous, 1 Day/Dose, 1 of 4  cycles Administration: 3,150 mg (04/27/2018)  for chemotherapy treatment.    PRIOR THERAPY: 1. 1st line gemcitabine/abraxane on days 1 and 8 from 07/25/17 - 01/22/18 s/p 9 cycles  2. Second line5-fu pump and liposomal irinotecan every 2 weeks started on 02/05/2018, discontinued 04/17/18 due to disease progression. S/p 5 cycles   CURRENT THERAPY: 3rd line FOLFOX began 04/27/18   INTERVAL HISTORY: Ms. Lindseth returns for follow up and cycle 2 FOLFOX as scheduled. She felt terrible and developed severe fatigue and did not get out of bed other than to bathroom for over 1 week. She has recovered somewhat this week. She did not leave her home or go outside other than to medical appointments. She lost some weight but since restarted cooking and eating more this week. She ran out of mirtazapine for 1 week. She usually has 2 BMs per day, which occur 2 hours after meals. Occasional diarrhea and gas pain. Takes lomotil PRN with good relief. Denies n/v. Has tingling to right foot at night, otherwise no neuropathy. Denies fever, chills, cough, chest pain, dyspnea, or leg edema.    MEDICAL HISTORY:  Past Medical History:  Diagnosis Date  . Anxiety   . Family history of colon cancer   . Family history of pancreatic cancer   . Family history of prostate cancer     SURGICAL HISTORY: Past Surgical History:  Procedure Laterality Date  . IR FLUORO GUIDE PORT INSERTION RIGHT  07/24/2017  . IR US GUIDE VASC ACCESS RIGHT  07/24/2017    I have reviewed the social history and family history with the patient and they are unchanged from previous note.  ALLERGIES:  has No  Known Allergies.  MEDICATIONS:  Current Outpatient Medications  Medication Sig Dispense Refill  . diphenoxylate-atropine (LOMOTIL) 2.5-0.025 MG tablet TAKE 1 TABLET BY MOUTH 4 TIMES DAILY AS NEEDED FOR  DIARRHEA  OR  LOOSE  STOOLS 30 tablet 2  . dronabinol (MARINOL) 2.5 MG capsule Take 1 capsule (2.5 mg total) by mouth 2 (two) times daily before a  meal. 60 capsule 0  . LORazepam (ATIVAN) 1 MG tablet Take 1 mg 2 (two) times daily by mouth.    . mirtazapine (REMERON) 15 MG tablet Take 1 tablet (15 mg total) by mouth at bedtime. 30 tablet 2  . Multiple Vitamin (MULTIVITAMIN WITH MINERALS) TABS tablet Take 1 tablet daily by mouth. One-A-Day for Women    . potassium chloride SA (K-DUR,KLOR-CON) 20 MEQ tablet Take 1 tablet (20 mEq total) by mouth daily. 40 tablet 1   No current facility-administered medications for this visit.     PHYSICAL EXAMINATION: ECOG PERFORMANCE STATUS: 2-3  Vitals:   05/15/18 1017  BP: 109/69  Pulse: (!) 58  Resp: 14  Temp: 97.7 F (36.5 C)  SpO2: 100%   Filed Weights   05/15/18 1017  Weight: 81 lb 6.4 oz (36.9 kg)    GENERAL:alert, no distress and comfortable SKIN: no rashes or significant lesions EYES: sclera clear OROPHARYNX:no thrush or ulcers LYMPH:  no palpable cervical lymphadenopathy LUNGS: clear to auscultationwith normal breathing effort HEART: regular rate & rhythm, no lower extremity edema ABDOMEN:abdomen soft, non-tender and normal bowel sounds Musculoskeletal:no cyanosis of digits and no clubbing  NEURO: alert & oriented x 3 with fluent speech, no focal motor/sensory deficits PAC without erythema   LABORATORY DATA:  I have reviewed the data as listed CBC Latest Ref Rng & Units 05/15/2018 04/26/2018 04/17/2018  WBC 3.9 - 10.3 K/uL 4.7 7.0 6.1  Hemoglobin 11.6 - 15.9 g/dL 11.3(L) 11.4(L) 12.1  Hematocrit 34.8 - 46.6 % 33.6(L) 34.4(L) 36.2  Platelets 145 - 400 K/uL 159 182 145     CMP Latest Ref Rng & Units 05/15/2018 04/26/2018 04/17/2018  Glucose 70 - 99 mg/dL 100(H) 88 106(H)  BUN 8 - 23 mg/dL _0 Creatinine 0.44 - 1.00 mg/dL 0.69 0.81 0.85  Sodium 135 - 145 mmol/L 140 139 141  Potassium 3.5 - 5.1 mmol/L 3.9 4.3 3.7  Chloride 98 - 111 mmol/L 110 108 112(H)  CO2 22 - 32 mmol/L _1 Calcium 8.9 - 10.3 mg/dL 9.0 9.5 9.3  Total Protein 6.5 - 8.1 g/dL 7.2 7.1 7.3  Total  Bilirubin 0.3 - 1.2 mg/dL 0.2(L) 0.3 0.3  Alkaline Phos 38 - 126 U/L 77 66 69  AST 15 - 41 U/L _2 ALT 0 - 44 U/L 39 18 25      RADIOGRAPHIC STUDIES: I have personally reviewed the radiological images as listed and agreed with the findings in the report. No results found.   ASSESSMENT & PLAN:  1. 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 Anorexia and weight loss. Weight stable. Plan to resume Marinol. 3. Diarrhea, controlled with lomotil  4. Smoking cessation 5. Goals of care 6. Peripheral neuropathy involving feet, grade 1  Ms. Leisinger appears stable. She completed cycle 1 FOLFOX at full dose. She tolerated poorly with severe fatigue lasting over 1 week, not able to leave the house. She has nearly recovered, but prefers to postpone cycle 2 for one week to gain weight and strength. I agree. She does not require  IVF, she is drinking adequately. VS and labs are stable. Will delay cycle 2 to next week with dose reductions. Will f/u in 3 weeks with cycle 3.   PLAN: -Hold FOLFOX today, reschedule in 1 week with dose reduction  -F/u in 3 weeks with cycle 3   All questions were answered. The patient knows to call the clinic with any problems, questions or concerns. No barriers to learning was detected.     Alla Feeling, NP 05/15/18

## 2018-05-15 ENCOUNTER — Inpatient Hospital Stay: Payer: Medicare Other

## 2018-05-15 ENCOUNTER — Telehealth: Payer: Self-pay

## 2018-05-15 ENCOUNTER — Inpatient Hospital Stay (HOSPITAL_BASED_OUTPATIENT_CLINIC_OR_DEPARTMENT_OTHER): Payer: Medicare Other | Admitting: Nurse Practitioner

## 2018-05-15 ENCOUNTER — Other Ambulatory Visit: Payer: Self-pay | Admitting: Hematology

## 2018-05-15 ENCOUNTER — Encounter: Payer: Self-pay | Admitting: Nurse Practitioner

## 2018-05-15 VITALS — BP 109/69 | HR 58 | Temp 97.7°F | Resp 14 | Ht 64.0 in | Wt 81.4 lb

## 2018-05-15 DIAGNOSIS — R197 Diarrhea, unspecified: Secondary | ICD-10-CM | POA: Diagnosis not present

## 2018-05-15 DIAGNOSIS — R5383 Other fatigue: Secondary | ICD-10-CM

## 2018-05-15 DIAGNOSIS — C787 Secondary malignant neoplasm of liver and intrahepatic bile duct: Principal | ICD-10-CM

## 2018-05-15 DIAGNOSIS — Z79899 Other long term (current) drug therapy: Secondary | ICD-10-CM | POA: Diagnosis not present

## 2018-05-15 DIAGNOSIS — Z5111 Encounter for antineoplastic chemotherapy: Secondary | ICD-10-CM | POA: Diagnosis not present

## 2018-05-15 DIAGNOSIS — R630 Anorexia: Secondary | ICD-10-CM | POA: Diagnosis not present

## 2018-05-15 DIAGNOSIS — G620 Drug-induced polyneuropathy: Secondary | ICD-10-CM | POA: Diagnosis not present

## 2018-05-15 DIAGNOSIS — J349 Unspecified disorder of nose and nasal sinuses: Secondary | ICD-10-CM

## 2018-05-15 DIAGNOSIS — Z452 Encounter for adjustment and management of vascular access device: Secondary | ICD-10-CM | POA: Diagnosis not present

## 2018-05-15 DIAGNOSIS — Z8042 Family history of malignant neoplasm of prostate: Secondary | ICD-10-CM

## 2018-05-15 DIAGNOSIS — C259 Malignant neoplasm of pancreas, unspecified: Secondary | ICD-10-CM

## 2018-05-15 DIAGNOSIS — C258 Malignant neoplasm of overlapping sites of pancreas: Secondary | ICD-10-CM | POA: Diagnosis not present

## 2018-05-15 DIAGNOSIS — T451X5S Adverse effect of antineoplastic and immunosuppressive drugs, sequela: Secondary | ICD-10-CM | POA: Diagnosis not present

## 2018-05-15 DIAGNOSIS — I700 Atherosclerosis of aorta: Secondary | ICD-10-CM | POA: Diagnosis not present

## 2018-05-15 DIAGNOSIS — Z800 Family history of malignant neoplasm of digestive organs: Secondary | ICD-10-CM

## 2018-05-15 DIAGNOSIS — R634 Abnormal weight loss: Secondary | ICD-10-CM

## 2018-05-15 LAB — CBC WITH DIFFERENTIAL/PLATELET
BASOS ABS: 0 10*3/uL (ref 0.0–0.1)
BASOS PCT: 1 %
EOS ABS: 0.1 10*3/uL (ref 0.0–0.5)
Eosinophils Relative: 1 %
HCT: 33.6 % — ABNORMAL LOW (ref 34.8–46.6)
HEMOGLOBIN: 11.3 g/dL — AB (ref 11.6–15.9)
LYMPHS ABS: 1.4 10*3/uL (ref 0.9–3.3)
Lymphocytes Relative: 29 %
MCH: 34.3 pg — AB (ref 25.1–34.0)
MCHC: 33.6 g/dL (ref 31.5–36.0)
MCV: 102.1 fL — ABNORMAL HIGH (ref 79.5–101.0)
Monocytes Absolute: 0.4 10*3/uL (ref 0.1–0.9)
Monocytes Relative: 8 %
NEUTROS PCT: 61 %
Neutro Abs: 2.9 10*3/uL (ref 1.5–6.5)
Platelets: 159 10*3/uL (ref 145–400)
RBC: 3.29 MIL/uL — AB (ref 3.70–5.45)
RDW: 13.9 % (ref 11.2–14.5)
WBC: 4.7 10*3/uL (ref 3.9–10.3)

## 2018-05-15 LAB — COMPREHENSIVE METABOLIC PANEL
ALBUMIN: 3.6 g/dL (ref 3.5–5.0)
ALT: 39 U/L (ref 0–44)
AST: 28 U/L (ref 15–41)
Alkaline Phosphatase: 77 U/L (ref 38–126)
Anion gap: 6 (ref 5–15)
BUN: 10 mg/dL (ref 8–23)
CHLORIDE: 110 mmol/L (ref 98–111)
CO2: 24 mmol/L (ref 22–32)
CREATININE: 0.69 mg/dL (ref 0.44–1.00)
Calcium: 9 mg/dL (ref 8.9–10.3)
GFR calc Af Amer: >60 mL/min (ref >60)
GFR calc non Af Amer: >60 mL/min (ref >60)
Glucose, Bld: 100 mg/dL — ABNORMAL HIGH (ref 70–99)
POTASSIUM: 3.9 mmol/L (ref 3.5–5.1)
Sodium: 140 mmol/L (ref 135–145)
Total Bilirubin: 0.2 mg/dL — ABNORMAL LOW (ref 0.3–1.2)
Total Protein: 7.2 g/dL (ref 6.5–8.1)

## 2018-05-15 MED ORDER — SODIUM CHLORIDE 0.9% FLUSH
10.0000 mL | Freq: Once | INTRAVENOUS | Status: AC
  Administered 2018-05-15: 10 mL
  Filled 2018-05-15: qty 10

## 2018-05-15 MED ORDER — HEPARIN SOD (PORK) LOCK FLUSH 100 UNIT/ML IV SOLN
500.0000 [IU] | Freq: Once | INTRAVENOUS | Status: AC
  Administered 2018-05-15: 500 [IU]
  Filled 2018-05-15: qty 5

## 2018-05-15 NOTE — Patient Instructions (Signed)
Implanted Port Home Guide An implanted port is a type of central line that is placed under the skin. Central lines are used to provide IV access when treatment or nutrition needs to be given through a person's veins. Implanted ports are used for long-term IV access. An implanted port may be placed because:  You need IV medicine that would be irritating to the small veins in your hands or arms.  You need long-term IV medicines, such as antibiotics.  You need IV nutrition for a long period.  You need frequent blood draws for lab tests.  You need dialysis.  Implanted ports are usually placed in the chest area, but they can also be placed in the upper arm, the abdomen, or the leg. An implanted port has two main parts:  Reservoir. The reservoir is round and will appear as a small, raised area under your skin. The reservoir is the part where a needle is inserted to give medicines or draw blood.  Catheter. The catheter is a thin, flexible tube that extends from the reservoir. The catheter is placed into a large vein. Medicine that is inserted into the reservoir goes into the catheter and then into the vein.  How will I care for my incision site? Do not get the incision site wet. Bathe or shower as directed by your health care provider. How is my port accessed? Special steps must be taken to access the port:  Before the port is accessed, a numbing cream can be placed on the skin. This helps numb the skin over the port site.  Your health care provider uses a sterile technique to access the port. ? Your health care provider must put on a mask and sterile gloves. ? The skin over your port is cleaned carefully with an antiseptic and allowed to dry. ? The port is gently pinched between sterile gloves, and a needle is inserted into the port.  Only "non-coring" port needles should be used to access the port. Once the port is accessed, a blood return should be checked. This helps ensure that the port  is in the vein and is not clogged.  If your port needs to remain accessed for a constant infusion, a clear (transparent) bandage will be placed over the needle site. The bandage and needle will need to be changed every week, or as directed by your health care provider.  Keep the bandage covering the needle clean and dry. Do not get it wet. Follow your health care provider's instructions on how to take a shower or bath while the port is accessed.  If your port does not need to stay accessed, no bandage is needed over the port.  What is flushing? Flushing helps keep the port from getting clogged. Follow your health care provider's instructions on how and when to flush the port. Ports are usually flushed with saline solution or a medicine called heparin. The need for flushing will depend on how the port is used.  If the port is used for intermittent medicines or blood draws, the port will need to be flushed: ? After medicines have been given. ? After blood has been drawn. ? As part of routine maintenance.  If a constant infusion is running, the port may not need to be flushed.  How long will my port stay implanted? The port can stay in for as long as your health care provider thinks it is needed. When it is time for the port to come out, surgery will be   done to remove it. The procedure is similar to the one performed when the port was put in. When should I seek immediate medical care? When you have an implanted port, you should seek immediate medical care if:  You notice a bad smell coming from the incision site.  You have swelling, redness, or drainage at the incision site.  You have more swelling or pain at the port site or the surrounding area.  You have a fever that is not controlled with medicine.  This information is not intended to replace advice given to you by your health care provider. Make sure you discuss any questions you have with your health care provider. Document  Released: 08/07/2005 Document Revised: 01/13/2016 Document Reviewed: 04/14/2013 Elsevier Interactive Patient Education  2017 Elsevier Inc.  

## 2018-05-15 NOTE — Telephone Encounter (Signed)
Printed avs and calender of upcoming appointment. Per 9/25 los 

## 2018-05-17 ENCOUNTER — Inpatient Hospital Stay: Payer: Medicare Other

## 2018-05-22 ENCOUNTER — Inpatient Hospital Stay: Payer: Medicare Other

## 2018-05-22 ENCOUNTER — Inpatient Hospital Stay: Payer: Medicare Other | Attending: Hematology

## 2018-05-22 VITALS — BP 104/57 | HR 57 | Temp 98.1°F | Resp 16

## 2018-05-22 DIAGNOSIS — Z5111 Encounter for antineoplastic chemotherapy: Secondary | ICD-10-CM | POA: Diagnosis not present

## 2018-05-22 DIAGNOSIS — R42 Dizziness and giddiness: Secondary | ICD-10-CM | POA: Insufficient documentation

## 2018-05-22 DIAGNOSIS — G620 Drug-induced polyneuropathy: Secondary | ICD-10-CM | POA: Insufficient documentation

## 2018-05-22 DIAGNOSIS — Z7189 Other specified counseling: Secondary | ICD-10-CM

## 2018-05-22 DIAGNOSIS — C259 Malignant neoplasm of pancreas, unspecified: Secondary | ICD-10-CM

## 2018-05-22 DIAGNOSIS — Z23 Encounter for immunization: Secondary | ICD-10-CM

## 2018-05-22 DIAGNOSIS — R630 Anorexia: Secondary | ICD-10-CM | POA: Insufficient documentation

## 2018-05-22 DIAGNOSIS — T451X5S Adverse effect of antineoplastic and immunosuppressive drugs, sequela: Secondary | ICD-10-CM | POA: Diagnosis not present

## 2018-05-22 DIAGNOSIS — Z800 Family history of malignant neoplasm of digestive organs: Secondary | ICD-10-CM | POA: Diagnosis not present

## 2018-05-22 DIAGNOSIS — C258 Malignant neoplasm of overlapping sites of pancreas: Secondary | ICD-10-CM | POA: Insufficient documentation

## 2018-05-22 DIAGNOSIS — J439 Emphysema, unspecified: Secondary | ICD-10-CM | POA: Diagnosis not present

## 2018-05-22 DIAGNOSIS — Z9221 Personal history of antineoplastic chemotherapy: Secondary | ICD-10-CM | POA: Diagnosis not present

## 2018-05-22 DIAGNOSIS — Z79899 Other long term (current) drug therapy: Secondary | ICD-10-CM | POA: Insufficient documentation

## 2018-05-22 DIAGNOSIS — R634 Abnormal weight loss: Secondary | ICD-10-CM | POA: Diagnosis not present

## 2018-05-22 DIAGNOSIS — R197 Diarrhea, unspecified: Secondary | ICD-10-CM | POA: Diagnosis not present

## 2018-05-22 DIAGNOSIS — F418 Other specified anxiety disorders: Secondary | ICD-10-CM | POA: Diagnosis not present

## 2018-05-22 DIAGNOSIS — C787 Secondary malignant neoplasm of liver and intrahepatic bile duct: Secondary | ICD-10-CM | POA: Diagnosis not present

## 2018-05-22 LAB — COMPREHENSIVE METABOLIC PANEL
ALBUMIN: 3.8 g/dL (ref 3.5–5.0)
ALT: 34 U/L (ref 0–44)
ANION GAP: 9 (ref 5–15)
AST: 26 U/L (ref 15–41)
Alkaline Phosphatase: 74 U/L (ref 38–126)
BUN: 10 mg/dL (ref 8–23)
CHLORIDE: 109 mmol/L (ref 98–111)
CO2: 23 mmol/L (ref 22–32)
Calcium: 9.5 mg/dL (ref 8.9–10.3)
Creatinine, Ser: 0.78 mg/dL (ref 0.44–1.00)
GFR calc Af Amer: >60 mL/min (ref >60)
GFR calc non Af Amer: >60 mL/min (ref >60)
GLUCOSE: 95 mg/dL (ref 70–99)
POTASSIUM: 4.6 mmol/L (ref 3.5–5.1)
Sodium: 141 mmol/L (ref 135–145)
Total Bilirubin: 0.3 mg/dL (ref 0.3–1.2)
Total Protein: 7.5 g/dL (ref 6.5–8.1)

## 2018-05-22 LAB — CBC WITH DIFFERENTIAL/PLATELET
Basophils Absolute: 0 10*3/uL (ref 0.0–0.1)
Basophils Relative: 1 %
Eosinophils Absolute: 0.1 10*3/uL (ref 0.0–0.5)
Eosinophils Relative: 1 %
HCT: 34.9 % (ref 34.8–46.6)
Hemoglobin: 11.8 g/dL (ref 11.6–15.9)
Lymphocytes Relative: 17 %
Lymphs Abs: 1 10*3/uL (ref 0.9–3.3)
MCH: 35.3 pg — ABNORMAL HIGH (ref 25.1–34.0)
MCHC: 33.7 g/dL (ref 31.5–36.0)
MCV: 104.7 fL — ABNORMAL HIGH (ref 79.5–101.0)
Monocytes Absolute: 0.3 10*3/uL (ref 0.1–0.9)
Monocytes Relative: 6 %
Neutro Abs: 4.6 10*3/uL (ref 1.5–6.5)
Neutrophils Relative %: 75 %
Platelets: 172 10*3/uL (ref 145–400)
RBC: 3.34 MIL/uL — ABNORMAL LOW (ref 3.70–5.45)
RDW: 14.8 % — ABNORMAL HIGH (ref 11.2–14.5)
WBC: 6 10*3/uL (ref 3.9–10.3)

## 2018-05-22 MED ORDER — LEUCOVORIN CALCIUM INJECTION 350 MG
400.0000 mg/m2 | Freq: Once | INTRAVENOUS | Status: AC
  Administered 2018-05-22: 528 mg via INTRAVENOUS
  Filled 2018-05-22: qty 26.4

## 2018-05-22 MED ORDER — PALONOSETRON HCL INJECTION 0.25 MG/5ML
0.2500 mg | Freq: Once | INTRAVENOUS | Status: AC
  Administered 2018-05-22: 0.25 mg via INTRAVENOUS

## 2018-05-22 MED ORDER — DEXAMETHASONE SODIUM PHOSPHATE 10 MG/ML IJ SOLN
INTRAMUSCULAR | Status: AC
  Filled 2018-05-22: qty 1

## 2018-05-22 MED ORDER — OXALIPLATIN CHEMO INJECTION 100 MG/20ML
70.0000 mg/m2 | Freq: Once | INTRAVENOUS | Status: AC
  Administered 2018-05-22: 90 mg via INTRAVENOUS
  Filled 2018-05-22: qty 18

## 2018-05-22 MED ORDER — PALONOSETRON HCL INJECTION 0.25 MG/5ML
INTRAVENOUS | Status: AC
  Filled 2018-05-22: qty 5

## 2018-05-22 MED ORDER — DEXAMETHASONE SODIUM PHOSPHATE 10 MG/ML IJ SOLN
10.0000 mg | Freq: Once | INTRAMUSCULAR | Status: AC
  Administered 2018-05-22: 10 mg via INTRAVENOUS

## 2018-05-22 MED ORDER — INFLUENZA VAC SPLIT QUAD 0.5 ML IM SUSY
PREFILLED_SYRINGE | INTRAMUSCULAR | Status: AC
  Filled 2018-05-22: qty 0.5

## 2018-05-22 MED ORDER — DEXTROSE 5 % IV SOLN
Freq: Once | INTRAVENOUS | Status: AC
  Administered 2018-05-22: 10:00:00 via INTRAVENOUS
  Filled 2018-05-22: qty 250

## 2018-05-22 MED ORDER — SODIUM CHLORIDE 0.9 % IV SOLN
2200.0000 mg/m2 | INTRAVENOUS | Status: DC
  Administered 2018-05-22: 2900 mg via INTRAVENOUS
  Filled 2018-05-22: qty 58

## 2018-05-22 MED ORDER — INFLUENZA VAC SPLIT QUAD 0.5 ML IM SUSY
0.5000 mL | PREFILLED_SYRINGE | Freq: Once | INTRAMUSCULAR | Status: AC
  Administered 2018-05-22: 0.5 mL via INTRAMUSCULAR

## 2018-05-22 NOTE — Patient Instructions (Signed)
[facility] Discharge Instructions for Patients Receiving Chemotherapy  Today you received the following chemotherapy agents: Oxaliplatin, Leucovorin, and 5FU.  To help prevent nausea and vomiting after your treatment, we encourage you to take your nausea medication as directed.   If you develop nausea and vomiting that is not controlled by your nausea medication, call the clinic.   BELOW ARE SYMPTOMS THAT SHOULD BE REPORTED IMMEDIATELY:  *FEVER GREATER THAN 100.5 F  *CHILLS WITH OR WITHOUT FEVER  NAUSEA AND VOMITING THAT IS NOT CONTROLLED WITH YOUR NAUSEA MEDICATION  *UNUSUAL SHORTNESS OF BREATH  *UNUSUAL BRUISING OR BLEEDING  TENDERNESS IN MOUTH AND THROAT WITH OR WITHOUT PRESENCE OF ULCERS  *URINARY PROBLEMS  *BOWEL PROBLEMS  UNUSUAL RASH Items with * indicate a potential emergency and should be followed up as soon as possible.  Feel free to call the clinic should you have any questions or concerns. The clinic phone number is [phone].  Please show the CHEMO ALERT CARD at check-in to the Emergency Department and triage nurse.    

## 2018-05-24 ENCOUNTER — Inpatient Hospital Stay: Payer: Medicare Other

## 2018-05-24 VITALS — BP 110/64 | HR 59 | Temp 98.2°F | Resp 17

## 2018-05-24 DIAGNOSIS — Z7189 Other specified counseling: Secondary | ICD-10-CM

## 2018-05-24 DIAGNOSIS — Z9221 Personal history of antineoplastic chemotherapy: Secondary | ICD-10-CM | POA: Diagnosis not present

## 2018-05-24 DIAGNOSIS — C787 Secondary malignant neoplasm of liver and intrahepatic bile duct: Secondary | ICD-10-CM

## 2018-05-24 DIAGNOSIS — R197 Diarrhea, unspecified: Secondary | ICD-10-CM | POA: Diagnosis not present

## 2018-05-24 DIAGNOSIS — C258 Malignant neoplasm of overlapping sites of pancreas: Secondary | ICD-10-CM | POA: Diagnosis not present

## 2018-05-24 DIAGNOSIS — Z23 Encounter for immunization: Secondary | ICD-10-CM | POA: Diagnosis not present

## 2018-05-24 DIAGNOSIS — Z5111 Encounter for antineoplastic chemotherapy: Secondary | ICD-10-CM | POA: Diagnosis not present

## 2018-05-24 DIAGNOSIS — C259 Malignant neoplasm of pancreas, unspecified: Secondary | ICD-10-CM

## 2018-05-24 MED ORDER — HEPARIN SOD (PORK) LOCK FLUSH 100 UNIT/ML IV SOLN
500.0000 [IU] | Freq: Once | INTRAVENOUS | Status: AC | PRN
  Administered 2018-05-24: 500 [IU]
  Filled 2018-05-24: qty 5

## 2018-05-24 MED ORDER — SODIUM CHLORIDE 0.9% FLUSH
10.0000 mL | INTRAVENOUS | Status: DC | PRN
  Administered 2018-05-24: 10 mL
  Filled 2018-05-24: qty 10

## 2018-05-29 ENCOUNTER — Other Ambulatory Visit: Payer: Medicare Other

## 2018-05-29 ENCOUNTER — Ambulatory Visit: Payer: Medicare Other

## 2018-05-29 ENCOUNTER — Encounter: Payer: Medicare Other | Admitting: Nutrition

## 2018-05-29 ENCOUNTER — Ambulatory Visit: Payer: Medicare Other | Admitting: Hematology

## 2018-06-03 ENCOUNTER — Telehealth: Payer: Self-pay | Admitting: Hematology

## 2018-06-03 NOTE — Telephone Encounter (Signed)
Appts moved and patient notified and is ok with new date/time per 10/14 sch msg

## 2018-06-04 NOTE — Progress Notes (Signed)
[facility]  [phone] [phone]  Clinic Follow up Note   Patient Care Team: Wilford Corner, MD as PCP - General (Gastroenterology) Truitt Merle, MD as Consulting Physician (Hematology) 06/05/2018  SUMMARY OF ONCOLOGIC HISTORY:   Pancreatic cancer metastasized to liver ([place])   06/21/2017 Imaging    CT CAP IMPRESSION: 1. Indistinct low-attenuation mass in the uncinate process of the pancreas with dilatation of remainder of the pancreatic duct and some encasement of the SMA. These findings are highly indicative of pancreatic carcinoma. Recommend MRI to assess further. 2. Multiple low-attenuation lesions throughout the right and left lobes of liver most consistent with liver metastasis again MRI may be helpful. 3. Moderate change of abdominal aortic atherosclerosis. 4. Changes of centrilobular and paraseptal emphysema on CT of the chest. No lung lesion is seen.     07/06/2017 Initial Biopsy    Diagnosis Liver, needle/core biopsy, Right lobe - METASTATIC ADENOCARCINOMA.    07/10/2017 Initial Diagnosis    Pancreatic cancer metastasized to liver ([place])    07/25/2017 -  Chemotherapy    Gemcitabine and Abraxane 3 weeks on and 1 week off starting 07/25/17     10/02/2017 Imaging    CT CAP W Contrast 10/02/17 IMPRESSION: 1. Reduced size of the primary pancreatic neoplasm, which still continues to encase the superior mesenteric artery and occlude the superior mesenteric vein. The numerous metastatic liver lesions show some improvement on portal venous phase images, with increase in the central necrosis and slight reduction in size. However, the arterial phase images, which are present today but were not available on the prior exam, demonstrate that there is a significant peripheral rind around each of these lesions and that the portal venous phase images underestimate total lesions size. No findings of metastatic disease to the chest or  skeleton. 2. Severe and worsened cachexia. 3. Low-grade mesenteric edema and a small amount of pelvic ascites. 4. Aortic Atherosclerosis (ICD10-I70.0) and Emphysema (ICD10-J43.9). 5.  Prominent stool throughout the colon favors constipation.    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  The Hereditary Gene Panel offered by [facility] includes sequencing and/or deletion duplication testing of the following 47 genes: APC, ATM, AXIN2, BARD1, BMPR1A, BRCA1, BRCA2, BRIP1, CDH1, CDK4, CDKN2A (p14ARF), CDKN2A (p16INK4a), CHEK2, CTNNA1, DICER1, EPCAM (Deletion/duplication testing only), GREM1 (promoter region deletion/duplication testing only), KIT, MEN1, MLH1, MSH2, MSH3, MSH6, MUTYH, NBN, NF1, NHTL1, PALB2, PDGFRA, PMS2, POLD1, POLE, PTEN, RAD50, RAD51C, RAD51D, SDHB, SDHC, SDHD, SMAD4, SMARCA4. STK11, TP53, TSC1, TSC2, and VHL.  The following genes were evaluated for sequence changes only: SDHA and HOXB13 c.251G>A variant only. The report date is November 27, 2017.     01/08/2018 Imaging    CT CAP W Contrast 01/08/18  IMPRESSION: 1. Overall, there appears to be slight progression of disease, as evidenced by slight growth of the primary pancreatic lesion. While some of the previously noted hepatic lesions appear to have resolved or gotten smaller, others appear to be new or are slightly larger than the prior examination, as detailed above. 2. Several unusual appearing pulmonary nodules scattered throughout the lungs bilaterally, some of which are solid while others are more subsolid in appearance. Although these could be infectious or inflammatory in etiology, the possibility of developing metastatic disease in the lungs should be considered, and close attention on follow-up studies is recommended. 3. Aortic atherosclerosis. 4. Additional incidental findings, as above. Aortic Atherosclerosis  (ICD10-I70.0).  02/04/2018 - 04/15/2018 Chemotherapy    The patient had leucovorin 544 mg in dextrose 5 % 250 mL infusion, 400 mg/m2 = 544 mg, Intravenous,  Once, 5 of 6 cycles Administration: 544 mg (02/05/2018), 544 mg (02/19/2018), 544 mg (03/05/2018), 544 mg (03/21/2018), 544 mg (04/02/2018) fluorouracil (ADRUCIL) 3,250 mg in sodium chloride 0.9 % 85 mL chemo infusion, 2,400 mg/m2 = 3,250 mg, Intravenous, 1 Day/Dose, 5 of 6 cycles Administration: 3,250 mg (02/05/2018), 3,250 mg (02/19/2018), 3,250 mg (03/05/2018), 3,250 mg (03/21/2018), 3,250 mg (04/02/2018) irinotecan LIPOSOME (ONIVYDE) 95 mg in sodium chloride 0.9 % 500 mL chemo infusion, 94.6 mg, Intravenous, Once, 5 of 6 cycles Administration: 95 mg (02/05/2018), 95 mg (02/19/2018), 95 mg (03/05/2018), 95 mg (03/21/2018), 95 mg (04/02/2018)  for chemotherapy treatment.     04/12/2018 Imaging    IMPRESSION: 1. Interval increase in size and development of multiple new low-attenuation lesions throughout the liver 2. Slight interval increase in size of primary pancreatic lesion. 3. Previously described pulmonary nodules are decreased in size or resolved when compared to prior exam, potentially resolving infectious/inflammatory process. 4. Interval increase in size of sclerotic lesion within the L2 vertebral body, potentially representing osseous metastatic disease.    04/16/2018 -  Chemotherapy    The patient had palonosetron (ALOXI) injection 0.25 mg, 0.25 mg, Intravenous,  Once, 3 of 4 cycles Administration: 0.25 mg (04/27/2018), 0.25 mg (05/22/2018) leucovorin 528 mg in dextrose 5 % 250 mL infusion, 400 mg/m2 = 528 mg, Intravenous,  Once, 3 of 4 cycles Administration: 528 mg (04/27/2018), 528 mg (05/22/2018) oxaliplatin (ELOXATIN) 110 mg in dextrose 5 % 500 mL chemo infusion, 85 mg/m2 = 110 mg, Intravenous,  Once, 3 of 4 cycles Dose modification: 70 mg/m2 (original dose 85 mg/m2, Cycle 2, Reason: Provider Judgment, Comment: poor tolerance ) Administration:  110 mg (04/27/2018), 90 mg (05/22/2018) fluorouracil (ADRUCIL) 3,150 mg in sodium chloride 0.9 % 87 mL chemo infusion, 2,400 mg/m2 = 3,150 mg, Intravenous, 1 Day/Dose, 3 of 4 cycles Dose modification: 2,200 mg/m2 (original dose 2,400 mg/m2, Cycle 2, Reason: Provider Judgment, Comment: poor tolerance ) Administration: 3,150 mg (04/27/2018), 2,900 mg (05/22/2018)  for chemotherapy treatment.    PRIOR THERAPY: 1. 1st line gemcitabine/abraxane on days 1 and 8 from 07/25/17 - 01/22/18 s/p 9 cycles  2. Second line5-fu pump and liposomal irinotecan every 2 weeks started on 02/05/2018, discontinued 04/17/18 due to disease progression. S/p 5 cycles   CURRENT THERAPY: 3rd line FOLFOX began 04/27/18   INTERVAL HISTORY: Ms. Abramo returns by herself for follow up and cycle 3 FOLFOX. Cycle 2 was delayed and dose reduced for severe fatigue, she received on 10/2. She tolerated much better, with less fatigue. She was able to work outside and leave the house more. She avoids social situations by choice. Her appetite was decreased for first week after chemo, but has improved some this week. She eats 3 times per day, breakfast and dinner with one snack. On mirtazapine and marinol. Mirtazapine helps her sleep. Denies n/v. Has frequent and loose stool this week, occurring after meals x3-4 episodes per day but none today. She takes imodium before meals and after BM, has lomotil but not sure if taking maximum dose. No blood in stool. She has gas pain but denies other abdominal pain. Occasional tingling in right foot at night is unchanged. Denies fever, chills, cough, chest pain, dyspnea, or leg edema.    MEDICAL HISTORY:  Past Medical History:  Diagnosis Date  . Anxiety   . Family history of  colon cancer   . Family history of pancreatic cancer   . Family history of prostate cancer     SURGICAL HISTORY: Past Surgical History:  Procedure Laterality Date  . IR FLUORO GUIDE PORT INSERTION RIGHT  07/24/2017  . IR US GUIDE VASC  ACCESS RIGHT  07/24/2017    I have reviewed the social history and family history with the patient and they are unchanged from previous note.  ALLERGIES:  has No Known Allergies.  MEDICATIONS:  Current Outpatient Medications  Medication Sig Dispense Refill  . diphenoxylate-atropine (LOMOTIL) 2.5-0.025 MG tablet TAKE 1 TABLET BY MOUTH 4 TIMES DAILY AS NEEDED FOR  DIARRHEA  OR  LOOSE  STOOLS 30 tablet 2  . dronabinol (MARINOL) 2.5 MG capsule Take 1 capsule (2.5 mg total) by mouth 2 (two) times daily before a meal. 60 capsule 0  . LORazepam (ATIVAN) 1 MG tablet Take 1 mg 2 (two) times daily by mouth.    . mirtazapine (REMERON) 15 MG tablet Take 1 tablet (15 mg total) by mouth at bedtime. 30 tablet 2  . Multiple Vitamin (MULTIVITAMIN WITH MINERALS) TABS tablet Take 1 tablet daily by mouth. One-A-Day for Women    . potassium chloride SA (K-DUR,KLOR-CON) 20 MEQ tablet Take 1 tablet (20 mEq total) by mouth daily. 40 tablet 1   No current facility-administered medications for this visit.    Facility-Administered Medications Ordered in Other Visits  Medication Dose Route Frequency Provider Last Rate Last Dose  . fluorouracil (ADRUCIL) 2,900 mg in sodium chloride 0.9 % 92 mL chemo infusion  2,200 mg/m2 (Treatment Plan Recorded) Intravenous 1 day or 1 dose Truitt Merle, MD        PHYSICAL EXAMINATION: ECOG PERFORMANCE STATUS: 1 - Symptomatic but completely ambulatory  Vitals:   06/05/18 1100  BP: 94/60  Pulse: (!) 55  Resp: 17  Temp: (!) 97.5 F (36.4 C)  SpO2: 100%   Filed Weights   06/05/18 1100  Weight: 77 lb 3.2 oz (35 kg)    GENERAL:alert, no distress and comfortable SKIN: no rashes or significant lesions EYES: sclera clear OROPHARYNX:no thrush or ulcers  LYMPH:  no palpable cervical or supraclavicular lymphadenopathy  LUNGS: distant breath sounds with normal breathing effort HEART: regular rate & rhythm, no lower extremity edema ABDOMEN:abdomen soft, non-tender and normal bowel  sounds Musculoskeletal:no cyanosis of digits and no clubbing  NEURO: alert & oriented x 3 with fluent speech, no focal motor/sensory deficits PAC without erythema    LABORATORY DATA:  I have reviewed the data as listed CBC Latest Ref Rng & Units 06/05/2018 05/22/2018 05/15/2018  WBC 4.0 - 10.5 K/uL 6.3 6.0 4.7  Hemoglobin 12.0 - 15.0 g/dL 11.5(L) 11.8 11.3(L)  Hematocrit 36.0 - 46.0 % 34.3(L) 34.9 33.6(L)  Platelets 150 - 400 K/uL 133(L) 172 159     CMP Latest Ref Rng & Units 06/05/2018 05/22/2018 05/15/2018  Glucose 70 - 99 mg/dL 89 95 100(H)  BUN 8 - 23 mg/dL _0 Creatinine 0.44 - 1.00 mg/dL 0.78 0.78 0.69  Sodium 135 - 145 mmol/L 140 141 140  Potassium 3.5 - 5.1 mmol/L 3.9 4.6 3.9  Chloride 98 - 111 mmol/L 115(H) 109 110  CO2 22 - 32 mmol/L 19(L) 23 24  Calcium 8.9 - 10.3 mg/dL 8.9 9.5 9.0  Total Protein 6.5 - 8.1 g/dL 7.3 7.5 7.2  Total Bilirubin 0.3 - 1.2 mg/dL 0.3 0.3 0.2(L)  Alkaline Phos 38 - 126 U/L 80 74 77  AST 15 -  41 U/L 43(H) 26 28  ALT 0 - 44 U/L 63(H) 34 39      RADIOGRAPHIC STUDIES: I have personally reviewed the radiological images as listed and agreed with the findings in the report. No results found.   ASSESSMENT & PLAN:  1. 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 Anorexia and weight loss. On Marinol and mirtazapine 3. Diarrhea, controlled with lomotil  4. Smoking cessation 5. Goals of care 6. Peripheral neuropathy involving feet, grade 1  Ms. Hehr appears stable. She completed cycle 2 dose-reduced FOLFOX. She tolerated better with dose reductions, less fatigue. However, she has increased diarrhea lately, which initially improved but has worsened recently. We reviewed imodium and lomotil. I encouraged her to remain adequately hydrated. BP is slightly low today, she is asymptomatic. Will support with 500 cc NS in addition to chemo today. She has lost more weight, on mirtazapine and marinol. I strongly  urged her to eat more, she will try. Labs reviewed, CBC and CMP are adequate to proceed with treatment. Transaminiases are slightly elevated, possibly related to liver metastasis vs chemotherapy; bili is normal. OK to treat. She is going to [place] 10/20-10/27, I suggest she call before she leaves if she has worsening side effects or new concerns. She agrees. Will see her back in 2 weeks to f/u before cycle 4.   PLAN: -Labs reviewed, proceed with cycle 3 FOLFOX today at current dose  -Maximize imodium and lomotil -Increase po intake  -Return in 2 weeks for f/u and cycle 4 -500 cc NS today over 1 hour with treatment for diarrhea   All questions were answered. The patient knows to call the clinic with any problems, questions or concerns. No barriers to learning was detected.     Alla Feeling, NP 06/05/18

## 2018-06-05 ENCOUNTER — Inpatient Hospital Stay (HOSPITAL_BASED_OUTPATIENT_CLINIC_OR_DEPARTMENT_OTHER): Payer: Medicare Other | Admitting: Nurse Practitioner

## 2018-06-05 ENCOUNTER — Inpatient Hospital Stay: Payer: Medicare Other

## 2018-06-05 ENCOUNTER — Encounter: Payer: Self-pay | Admitting: Nurse Practitioner

## 2018-06-05 VITALS — BP 94/60 | HR 55 | Temp 97.5°F | Resp 17 | Ht 64.0 in | Wt 77.2 lb

## 2018-06-05 DIAGNOSIS — C259 Malignant neoplasm of pancreas, unspecified: Secondary | ICD-10-CM

## 2018-06-05 DIAGNOSIS — R630 Anorexia: Secondary | ICD-10-CM

## 2018-06-05 DIAGNOSIS — T451X5S Adverse effect of antineoplastic and immunosuppressive drugs, sequela: Secondary | ICD-10-CM

## 2018-06-05 DIAGNOSIS — F418 Other specified anxiety disorders: Secondary | ICD-10-CM

## 2018-06-05 DIAGNOSIS — R197 Diarrhea, unspecified: Secondary | ICD-10-CM

## 2018-06-05 DIAGNOSIS — Z7189 Other specified counseling: Secondary | ICD-10-CM

## 2018-06-05 DIAGNOSIS — C787 Secondary malignant neoplasm of liver and intrahepatic bile duct: Secondary | ICD-10-CM

## 2018-06-05 DIAGNOSIS — Z9221 Personal history of antineoplastic chemotherapy: Secondary | ICD-10-CM | POA: Diagnosis not present

## 2018-06-05 DIAGNOSIS — R634 Abnormal weight loss: Secondary | ICD-10-CM

## 2018-06-05 DIAGNOSIS — Z23 Encounter for immunization: Secondary | ICD-10-CM | POA: Diagnosis not present

## 2018-06-05 DIAGNOSIS — J439 Emphysema, unspecified: Secondary | ICD-10-CM | POA: Diagnosis not present

## 2018-06-05 DIAGNOSIS — C258 Malignant neoplasm of overlapping sites of pancreas: Secondary | ICD-10-CM

## 2018-06-05 DIAGNOSIS — Z5111 Encounter for antineoplastic chemotherapy: Secondary | ICD-10-CM | POA: Diagnosis not present

## 2018-06-05 DIAGNOSIS — Z800 Family history of malignant neoplasm of digestive organs: Secondary | ICD-10-CM | POA: Diagnosis not present

## 2018-06-05 DIAGNOSIS — G620 Drug-induced polyneuropathy: Secondary | ICD-10-CM

## 2018-06-05 DIAGNOSIS — Z79899 Other long term (current) drug therapy: Secondary | ICD-10-CM | POA: Diagnosis not present

## 2018-06-05 LAB — CBC WITH DIFFERENTIAL/PLATELET
ABS IMMATURE GRANULOCYTES: 0.01 10*3/uL (ref 0.00–0.07)
Basophils Absolute: 0 10*3/uL (ref 0.0–0.1)
Basophils Relative: 1 %
EOS PCT: 1 %
Eosinophils Absolute: 0.1 10*3/uL (ref 0.0–0.5)
HEMATOCRIT: 34.3 % — AB (ref 36.0–46.0)
HEMOGLOBIN: 11.5 g/dL — AB (ref 12.0–15.0)
Immature Granulocytes: 0 %
LYMPHS PCT: 22 %
Lymphs Abs: 1.4 10*3/uL (ref 0.7–4.0)
MCH: 34.4 pg — AB (ref 26.0–34.0)
MCHC: 33.5 g/dL (ref 30.0–36.0)
MCV: 102.7 fL — AB (ref 80.0–100.0)
MONO ABS: 0.3 10*3/uL (ref 0.1–1.0)
MONOS PCT: 4 %
NEUTROS ABS: 4.5 10*3/uL (ref 1.7–7.7)
Neutrophils Relative %: 72 %
Platelets: 133 10*3/uL — ABNORMAL LOW (ref 150–400)
RBC: 3.34 MIL/uL — AB (ref 3.87–5.11)
RDW: 14 % (ref 11.5–15.5)
WBC: 6.3 10*3/uL (ref 4.0–10.5)
nRBC: 0 % (ref 0.0–0.2)

## 2018-06-05 LAB — COMPREHENSIVE METABOLIC PANEL
ALBUMIN: 3.8 g/dL (ref 3.5–5.0)
ALT: 63 U/L — AB (ref 0–44)
AST: 43 U/L — AB (ref 15–41)
Alkaline Phosphatase: 80 U/L (ref 38–126)
Anion gap: 6 (ref 5–15)
BILIRUBIN TOTAL: 0.3 mg/dL (ref 0.3–1.2)
BUN: 12 mg/dL (ref 8–23)
CHLORIDE: 115 mmol/L — AB (ref 98–111)
CO2: 19 mmol/L — ABNORMAL LOW (ref 22–32)
CREATININE: 0.78 mg/dL (ref 0.44–1.00)
Calcium: 8.9 mg/dL (ref 8.9–10.3)
GFR calc Af Amer: >60 mL/min (ref >60)
GLUCOSE: 89 mg/dL (ref 70–99)
Potassium: 3.9 mmol/L (ref 3.5–5.1)
Sodium: 140 mmol/L (ref 135–145)
Total Protein: 7.3 g/dL (ref 6.5–8.1)

## 2018-06-05 MED ORDER — SODIUM CHLORIDE 0.9 % IV SOLN
Freq: Once | INTRAVENOUS | Status: AC
  Administered 2018-06-05: 12:00:00 via INTRAVENOUS
  Filled 2018-06-05: qty 250

## 2018-06-05 MED ORDER — DEXTROSE 5 % IV SOLN
Freq: Once | INTRAVENOUS | Status: AC
  Administered 2018-06-05: 14:00:00 via INTRAVENOUS
  Filled 2018-06-05: qty 250

## 2018-06-05 MED ORDER — LEUCOVORIN CALCIUM INJECTION 350 MG
400.0000 mg/m2 | Freq: Once | INTRAVENOUS | Status: AC
  Administered 2018-06-05: 528 mg via INTRAVENOUS
  Filled 2018-06-05: qty 25

## 2018-06-05 MED ORDER — DEXAMETHASONE SODIUM PHOSPHATE 10 MG/ML IJ SOLN
INTRAMUSCULAR | Status: AC
  Filled 2018-06-05: qty 1

## 2018-06-05 MED ORDER — DEXTROSE 5 % IV SOLN
Freq: Once | INTRAVENOUS | Status: AC
  Administered 2018-06-05: 13:00:00 via INTRAVENOUS
  Filled 2018-06-05: qty 250

## 2018-06-05 MED ORDER — SODIUM CHLORIDE 0.9% FLUSH
10.0000 mL | Freq: Once | INTRAVENOUS | Status: AC
  Administered 2018-06-05: 10 mL
  Filled 2018-06-05: qty 10

## 2018-06-05 MED ORDER — DEXAMETHASONE SODIUM PHOSPHATE 10 MG/ML IJ SOLN
10.0000 mg | Freq: Once | INTRAMUSCULAR | Status: AC
  Administered 2018-06-05: 10 mg via INTRAVENOUS

## 2018-06-05 MED ORDER — PALONOSETRON HCL INJECTION 0.25 MG/5ML
INTRAVENOUS | Status: AC
  Filled 2018-06-05: qty 5

## 2018-06-05 MED ORDER — PALONOSETRON HCL INJECTION 0.25 MG/5ML
0.2500 mg | Freq: Once | INTRAVENOUS | Status: AC
  Administered 2018-06-05: 0.25 mg via INTRAVENOUS

## 2018-06-05 MED ORDER — OXALIPLATIN CHEMO INJECTION 100 MG/20ML
70.0000 mg/m2 | Freq: Once | INTRAVENOUS | Status: AC
  Administered 2018-06-05: 90 mg via INTRAVENOUS
  Filled 2018-06-05: qty 18

## 2018-06-05 MED ORDER — SODIUM CHLORIDE 0.9 % IV SOLN
2200.0000 mg/m2 | INTRAVENOUS | Status: DC
  Administered 2018-06-05: 2900 mg via INTRAVENOUS
  Filled 2018-06-05: qty 58

## 2018-06-05 NOTE — Patient Instructions (Signed)
[facility] Discharge Instructions for Patients Receiving Chemotherapy  Today you received the following chemotherapy agents: Oxaliplatin, Leucovorin, and 5FU.  To help prevent nausea and vomiting after your treatment, we encourage you to take your nausea medication as directed.   If you develop nausea and vomiting that is not controlled by your nausea medication, call the clinic.   BELOW ARE SYMPTOMS THAT SHOULD BE REPORTED IMMEDIATELY:  *FEVER GREATER THAN 100.5 F  *CHILLS WITH OR WITHOUT FEVER  NAUSEA AND VOMITING THAT IS NOT CONTROLLED WITH YOUR NAUSEA MEDICATION  *UNUSUAL SHORTNESS OF BREATH  *UNUSUAL BRUISING OR BLEEDING  TENDERNESS IN MOUTH AND THROAT WITH OR WITHOUT PRESENCE OF ULCERS  *URINARY PROBLEMS  *BOWEL PROBLEMS  UNUSUAL RASH Items with * indicate a potential emergency and should be followed up as soon as possible.  Feel free to call the clinic should you have any questions or concerns. The clinic phone number is [phone].  Please show the CHEMO ALERT CARD at check-in to the Emergency Department and triage nurse.    

## 2018-06-06 ENCOUNTER — Inpatient Hospital Stay: Payer: Medicare Other

## 2018-06-06 ENCOUNTER — Other Ambulatory Visit: Payer: Self-pay | Admitting: Nurse Practitioner

## 2018-06-06 ENCOUNTER — Inpatient Hospital Stay: Payer: Medicare Other | Admitting: Nurse Practitioner

## 2018-06-06 DIAGNOSIS — C787 Secondary malignant neoplasm of liver and intrahepatic bile duct: Principal | ICD-10-CM

## 2018-06-06 DIAGNOSIS — C259 Malignant neoplasm of pancreas, unspecified: Secondary | ICD-10-CM

## 2018-06-07 ENCOUNTER — Inpatient Hospital Stay: Payer: Medicare Other

## 2018-06-07 VITALS — BP 99/57 | HR 83 | Temp 97.8°F | Resp 18

## 2018-06-07 DIAGNOSIS — Z9221 Personal history of antineoplastic chemotherapy: Secondary | ICD-10-CM | POA: Diagnosis not present

## 2018-06-07 DIAGNOSIS — Z7189 Other specified counseling: Secondary | ICD-10-CM

## 2018-06-07 DIAGNOSIS — Z5111 Encounter for antineoplastic chemotherapy: Secondary | ICD-10-CM | POA: Diagnosis not present

## 2018-06-07 DIAGNOSIS — Z23 Encounter for immunization: Secondary | ICD-10-CM | POA: Diagnosis not present

## 2018-06-07 DIAGNOSIS — C258 Malignant neoplasm of overlapping sites of pancreas: Secondary | ICD-10-CM | POA: Diagnosis not present

## 2018-06-07 DIAGNOSIS — C259 Malignant neoplasm of pancreas, unspecified: Secondary | ICD-10-CM

## 2018-06-07 DIAGNOSIS — R197 Diarrhea, unspecified: Secondary | ICD-10-CM | POA: Diagnosis not present

## 2018-06-07 DIAGNOSIS — C787 Secondary malignant neoplasm of liver and intrahepatic bile duct: Secondary | ICD-10-CM

## 2018-06-07 MED ORDER — HEPARIN SOD (PORK) LOCK FLUSH 100 UNIT/ML IV SOLN
500.0000 [IU] | Freq: Once | INTRAVENOUS | Status: AC | PRN
  Administered 2018-06-07: 500 [IU]
  Filled 2018-06-07: qty 5

## 2018-06-07 MED ORDER — SODIUM CHLORIDE 0.9% FLUSH
10.0000 mL | INTRAVENOUS | Status: DC | PRN
  Administered 2018-06-07: 10 mL
  Filled 2018-06-07: qty 10

## 2018-06-08 ENCOUNTER — Inpatient Hospital Stay: Payer: Medicare Other

## 2018-06-19 ENCOUNTER — Telehealth: Payer: Self-pay | Admitting: Hematology

## 2018-06-19 ENCOUNTER — Inpatient Hospital Stay: Payer: Medicare Other

## 2018-06-19 ENCOUNTER — Inpatient Hospital Stay: Payer: Medicare Other | Admitting: Nutrition

## 2018-06-19 ENCOUNTER — Encounter: Payer: Self-pay | Admitting: Nurse Practitioner

## 2018-06-19 ENCOUNTER — Inpatient Hospital Stay (HOSPITAL_BASED_OUTPATIENT_CLINIC_OR_DEPARTMENT_OTHER): Payer: Medicare Other | Admitting: Nurse Practitioner

## 2018-06-19 VITALS — BP 90/67 | HR 69 | Temp 97.7°F | Resp 16 | Ht 64.0 in | Wt 77.0 lb

## 2018-06-19 DIAGNOSIS — Z800 Family history of malignant neoplasm of digestive organs: Secondary | ICD-10-CM

## 2018-06-19 DIAGNOSIS — R630 Anorexia: Secondary | ICD-10-CM | POA: Diagnosis not present

## 2018-06-19 DIAGNOSIS — Z9221 Personal history of antineoplastic chemotherapy: Secondary | ICD-10-CM

## 2018-06-19 DIAGNOSIS — T451X5S Adverse effect of antineoplastic and immunosuppressive drugs, sequela: Secondary | ICD-10-CM | POA: Diagnosis not present

## 2018-06-19 DIAGNOSIS — C258 Malignant neoplasm of overlapping sites of pancreas: Secondary | ICD-10-CM

## 2018-06-19 DIAGNOSIS — R634 Abnormal weight loss: Secondary | ICD-10-CM

## 2018-06-19 DIAGNOSIS — C259 Malignant neoplasm of pancreas, unspecified: Secondary | ICD-10-CM

## 2018-06-19 DIAGNOSIS — R197 Diarrhea, unspecified: Secondary | ICD-10-CM

## 2018-06-19 DIAGNOSIS — R42 Dizziness and giddiness: Secondary | ICD-10-CM | POA: Diagnosis not present

## 2018-06-19 DIAGNOSIS — C787 Secondary malignant neoplasm of liver and intrahepatic bile duct: Secondary | ICD-10-CM

## 2018-06-19 DIAGNOSIS — Z23 Encounter for immunization: Secondary | ICD-10-CM | POA: Diagnosis not present

## 2018-06-19 DIAGNOSIS — Z79899 Other long term (current) drug therapy: Secondary | ICD-10-CM

## 2018-06-19 DIAGNOSIS — C9002 Multiple myeloma in relapse: Secondary | ICD-10-CM

## 2018-06-19 DIAGNOSIS — Z7189 Other specified counseling: Secondary | ICD-10-CM

## 2018-06-19 DIAGNOSIS — Z5111 Encounter for antineoplastic chemotherapy: Secondary | ICD-10-CM | POA: Diagnosis not present

## 2018-06-19 DIAGNOSIS — G620 Drug-induced polyneuropathy: Secondary | ICD-10-CM | POA: Diagnosis not present

## 2018-06-19 DIAGNOSIS — J439 Emphysema, unspecified: Secondary | ICD-10-CM | POA: Diagnosis not present

## 2018-06-19 DIAGNOSIS — F418 Other specified anxiety disorders: Secondary | ICD-10-CM

## 2018-06-19 LAB — COMPREHENSIVE METABOLIC PANEL
ALT: 40 U/L (ref 0–44)
AST: 29 U/L (ref 15–41)
Albumin: 3.5 g/dL (ref 3.5–5.0)
Alkaline Phosphatase: 68 U/L (ref 38–126)
Anion gap: 7 (ref 5–15)
BUN: 10 mg/dL (ref 8–23)
CHLORIDE: 112 mmol/L — AB (ref 98–111)
CO2: 21 mmol/L — AB (ref 22–32)
Calcium: 8.5 mg/dL — ABNORMAL LOW (ref 8.9–10.3)
Creatinine, Ser: 0.89 mg/dL (ref 0.44–1.00)
GFR calc Af Amer: >60 mL/min (ref >60)
GFR calc non Af Amer: >60 mL/min (ref >60)
Glucose, Bld: 120 mg/dL — ABNORMAL HIGH (ref 70–99)
Potassium: 4.1 mmol/L (ref 3.5–5.1)
SODIUM: 140 mmol/L (ref 135–145)
Total Bilirubin: 0.3 mg/dL (ref 0.3–1.2)
Total Protein: 6.7 g/dL (ref 6.5–8.1)

## 2018-06-19 LAB — CBC WITH DIFFERENTIAL/PLATELET
ABS IMMATURE GRANULOCYTES: 0.02 10*3/uL (ref 0.00–0.07)
BASOS PCT: 0 %
Basophils Absolute: 0 10*3/uL (ref 0.0–0.1)
Eosinophils Absolute: 0 10*3/uL (ref 0.0–0.5)
Eosinophils Relative: 1 %
HCT: 33.5 % — ABNORMAL LOW (ref 36.0–46.0)
Hemoglobin: 11.2 g/dL — ABNORMAL LOW (ref 12.0–15.0)
IMMATURE GRANULOCYTES: 0 %
Lymphocytes Relative: 26 %
Lymphs Abs: 1.9 10*3/uL (ref 0.7–4.0)
MCH: 34.3 pg — ABNORMAL HIGH (ref 26.0–34.0)
MCHC: 33.4 g/dL (ref 30.0–36.0)
MCV: 102.4 fL — ABNORMAL HIGH (ref 80.0–100.0)
MONOS PCT: 6 %
Monocytes Absolute: 0.4 10*3/uL (ref 0.1–1.0)
NEUTROS ABS: 4.8 10*3/uL (ref 1.7–7.7)
NEUTROS PCT: 67 %
Platelets: 123 10*3/uL — ABNORMAL LOW (ref 150–400)
RBC: 3.27 MIL/uL — AB (ref 3.87–5.11)
RDW: 14.5 % (ref 11.5–15.5)
WBC: 7.1 10*3/uL (ref 4.0–10.5)
nRBC: 0 % (ref 0.0–0.2)

## 2018-06-19 MED ORDER — OXALIPLATIN CHEMO INJECTION 100 MG/20ML
70.0000 mg/m2 | Freq: Once | INTRAVENOUS | Status: AC
  Administered 2018-06-19: 90 mg via INTRAVENOUS
  Filled 2018-06-19: qty 18

## 2018-06-19 MED ORDER — DEXAMETHASONE SODIUM PHOSPHATE 10 MG/ML IJ SOLN
INTRAMUSCULAR | Status: AC
  Filled 2018-06-19: qty 1

## 2018-06-19 MED ORDER — LEUCOVORIN CALCIUM INJECTION 350 MG
400.0000 mg/m2 | Freq: Once | INTRAVENOUS | Status: AC
  Administered 2018-06-19: 528 mg via INTRAVENOUS
  Filled 2018-06-19: qty 26.4

## 2018-06-19 MED ORDER — PALONOSETRON HCL INJECTION 0.25 MG/5ML
INTRAVENOUS | Status: AC
  Filled 2018-06-19: qty 5

## 2018-06-19 MED ORDER — DEXAMETHASONE SODIUM PHOSPHATE 10 MG/ML IJ SOLN
10.0000 mg | Freq: Once | INTRAMUSCULAR | Status: AC
  Administered 2018-06-19: 10 mg via INTRAVENOUS

## 2018-06-19 MED ORDER — DEXTROSE 5 % IV SOLN
Freq: Once | INTRAVENOUS | Status: AC
  Administered 2018-06-19: 13:00:00 via INTRAVENOUS
  Filled 2018-06-19: qty 250

## 2018-06-19 MED ORDER — PALONOSETRON HCL INJECTION 0.25 MG/5ML
0.2500 mg | Freq: Once | INTRAVENOUS | Status: AC
  Administered 2018-06-19: 0.25 mg via INTRAVENOUS

## 2018-06-19 MED ORDER — SODIUM CHLORIDE 0.9 % IV SOLN
Freq: Once | INTRAVENOUS | Status: AC
  Administered 2018-06-19: 12:00:00 via INTRAVENOUS
  Filled 2018-06-19: qty 250

## 2018-06-19 MED ORDER — SODIUM CHLORIDE 0.9 % IV SOLN
2200.0000 mg/m2 | INTRAVENOUS | Status: DC
  Administered 2018-06-19: 2900 mg via INTRAVENOUS
  Filled 2018-06-19: qty 58

## 2018-06-19 MED ORDER — SODIUM CHLORIDE 0.9 % IV SOLN
Freq: Once | INTRAVENOUS | Status: DC
  Filled 2018-06-19: qty 250

## 2018-06-19 MED ORDER — SODIUM CHLORIDE 0.9% FLUSH
10.0000 mL | Freq: Once | INTRAVENOUS | Status: AC
  Administered 2018-06-19: 10 mL
  Filled 2018-06-19: qty 10

## 2018-06-19 NOTE — Progress Notes (Signed)
Nutrition follow-up completed with patient being treated for metastatic pancreas cancer. Weight decreased and documented as 77 pounds on October 30. Patient reports her appetite is improving and she thinks about food all the time. She states she is cooking more often and trying to eat more. Diarrhea has improved on Lomotil and Imodium.   She continues to drink oral nutrition supplements.  Her daughter ships these for her to her home.  Nutrition diagnosis: Unintended weight loss continues.  Intervention: Encourage patient to increase consumption of food at meals and oral nutrition supplements between meals. Provided support and encouragement. Teach back method used.  Monitoring, evaluation, goals: Patient will tolerate increased calories and protein to minimize further weight loss.  Next visit: Wednesday, November 27 during infusion.  **Disclaimer: This note was dictated with voice recognition software. Similar sounding words can inadvertently be transcribed and this note may contain transcription errors which may not have been corrected upon publication of note.**

## 2018-06-19 NOTE — Progress Notes (Signed)
[facility]  [phone] [phone]  Clinic Follow up Note   Patient Care Team: Wilford Corner, MD as PCP - General (Gastroenterology) Truitt Merle, MD as Consulting Physician (Hematology) 06/19/2018  SUMMARY OF ONCOLOGIC HISTORY:   Pancreatic cancer metastasized to liver ([place])   06/21/2017 Imaging    CT CAP IMPRESSION: 1. Indistinct low-attenuation mass in the uncinate process of the pancreas with dilatation of remainder of the pancreatic duct and some encasement of the SMA. These findings are highly indicative of pancreatic carcinoma. Recommend MRI to assess further. 2. Multiple low-attenuation lesions throughout the right and left lobes of liver most consistent with liver metastasis again MRI may be helpful. 3. Moderate change of abdominal aortic atherosclerosis. 4. Changes of centrilobular and paraseptal emphysema on CT of the chest. No lung lesion is seen.     07/06/2017 Initial Biopsy    Diagnosis Liver, needle/core biopsy, Right lobe - METASTATIC ADENOCARCINOMA.    07/10/2017 Initial Diagnosis    Pancreatic cancer metastasized to liver ([place])    07/25/2017 -  Chemotherapy    Gemcitabine and Abraxane 3 weeks on and 1 week off starting 07/25/17     10/02/2017 Imaging    CT CAP W Contrast 10/02/17 IMPRESSION: 1. Reduced size of the primary pancreatic neoplasm, which still continues to encase the superior mesenteric artery and occlude the superior mesenteric vein. The numerous metastatic liver lesions show some improvement on portal venous phase images, with increase in the central necrosis and slight reduction in size. However, the arterial phase images, which are present today but were not available on the prior exam, demonstrate that there is a significant peripheral rind around each of these lesions and that the portal venous phase images underestimate total lesions size. No findings of metastatic disease to the chest or  skeleton. 2. Severe and worsened cachexia. 3. Low-grade mesenteric edema and a small amount of pelvic ascites. 4. Aortic Atherosclerosis (ICD10-I70.0) and Emphysema (ICD10-J43.9). 5.  Prominent stool throughout the colon favors constipation.    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  The Hereditary Gene Panel offered by [facility] includes sequencing and/or deletion duplication testing of the following 47 genes: APC, ATM, AXIN2, BARD1, BMPR1A, BRCA1, BRCA2, BRIP1, CDH1, CDK4, CDKN2A (p14ARF), CDKN2A (p16INK4a), CHEK2, CTNNA1, DICER1, EPCAM (Deletion/duplication testing only), GREM1 (promoter region deletion/duplication testing only), KIT, MEN1, MLH1, MSH2, MSH3, MSH6, MUTYH, NBN, NF1, NHTL1, PALB2, PDGFRA, PMS2, POLD1, POLE, PTEN, RAD50, RAD51C, RAD51D, SDHB, SDHC, SDHD, SMAD4, SMARCA4. STK11, TP53, TSC1, TSC2, and VHL.  The following genes were evaluated for sequence changes only: SDHA and HOXB13 c.251G>A variant only. The report date is November 27, 2017.     01/08/2018 Imaging    CT CAP W Contrast 01/08/18  IMPRESSION: 1. Overall, there appears to be slight progression of disease, as evidenced by slight growth of the primary pancreatic lesion. While some of the previously noted hepatic lesions appear to have resolved or gotten smaller, others appear to be new or are slightly larger than the prior examination, as detailed above. 2. Several unusual appearing pulmonary nodules scattered throughout the lungs bilaterally, some of which are solid while others are more subsolid in appearance. Although these could be infectious or inflammatory in etiology, the possibility of developing metastatic disease in the lungs should be considered, and close attention on follow-up studies is recommended. 3. Aortic atherosclerosis. 4. Additional incidental findings, as above. Aortic Atherosclerosis  (ICD10-I70.0).  02/04/2018 - 04/15/2018 Chemotherapy    The patient had leucovorin 544 mg in dextrose 5 % 250 mL infusion, 400 mg/m2 = 544 mg, Intravenous,  Once, 5 of 6 cycles Administration: 544 mg (02/05/2018), 544 mg (02/19/2018), 544 mg (03/05/2018), 544 mg (03/21/2018), 544 mg (04/02/2018) fluorouracil (ADRUCIL) 3,250 mg in sodium chloride 0.9 % 85 mL chemo infusion, 2,400 mg/m2 = 3,250 mg, Intravenous, 1 Day/Dose, 5 of 6 cycles Administration: 3,250 mg (02/05/2018), 3,250 mg (02/19/2018), 3,250 mg (03/05/2018), 3,250 mg (03/21/2018), 3,250 mg (04/02/2018) irinotecan LIPOSOME (ONIVYDE) 95 mg in sodium chloride 0.9 % 500 mL chemo infusion, 94.6 mg, Intravenous, Once, 5 of 6 cycles Administration: 95 mg (02/05/2018), 95 mg (02/19/2018), 95 mg (03/05/2018), 95 mg (03/21/2018), 95 mg (04/02/2018)  for chemotherapy treatment.     04/12/2018 Imaging    IMPRESSION: 1. Interval increase in size and development of multiple new low-attenuation lesions throughout the liver 2. Slight interval increase in size of primary pancreatic lesion. 3. Previously described pulmonary nodules are decreased in size or resolved when compared to prior exam, potentially resolving infectious/inflammatory process. 4. Interval increase in size of sclerotic lesion within the L2 vertebral body, potentially representing osseous metastatic disease.    04/16/2018 -  Chemotherapy    The patient had palonosetron (ALOXI) injection 0.25 mg, 0.25 mg, Intravenous,  Once, 3 of 4 cycles Administration: 0.25 mg (04/27/2018), 0.25 mg (05/22/2018), 0.25 mg (06/05/2018) leucovorin 528 mg in dextrose 5 % 250 mL infusion, 400 mg/m2 = 528 mg, Intravenous,  Once, 3 of 4 cycles Administration: 528 mg (04/27/2018), 528 mg (05/22/2018), 528 mg (06/05/2018) oxaliplatin (ELOXATIN) 110 mg in dextrose 5 % 500 mL chemo infusion, 85 mg/m2 = 110 mg, Intravenous,  Once, 3 of 4 cycles Dose modification: 70 mg/m2 (original dose 85 mg/m2, Cycle 2, Reason: Provider Judgment,  Comment: poor tolerance ) Administration: 110 mg (04/27/2018), 90 mg (05/22/2018), 90 mg (06/05/2018) fluorouracil (ADRUCIL) 3,150 mg in sodium chloride 0.9 % 87 mL chemo infusion, 2,400 mg/m2 = 3,150 mg, Intravenous, 1 Day/Dose, 3 of 4 cycles Dose modification: 2,200 mg/m2 (original dose 2,400 mg/m2, Cycle 2, Reason: Provider Judgment, Comment: poor tolerance ) Administration: 3,150 mg (04/27/2018), 2,900 mg (05/22/2018), 2,900 mg (06/05/2018)  for chemotherapy treatment.    PRIORTHERAPY: 1. 1st line gemcitabine/abraxane on days 1 and 8 from 07/25/17 - 01/22/18 s/p 9 cycles  2.Second line5-fu pump and liposomal irinotecan every 2 weeks started on 02/05/2018, discontinued 04/17/18 due to disease progression.S/p 5 cycles   CURRENT THERAPY:3rd line FOLFOXbegan 04/27/18  INTERVAL HISTORY: Ms. Lahey returns for follow up and cycle 4 FOLFOX as scheduled. She completed cycle 3 on 10/16. After treatment she flew to [place] and spent a week with family. She felt well on her trip. She has energy to paint ceramics. She spends time outside. She is eating more. Loose stool and gas pain has improved, 2 BMs per day with "some substance." denies n/v/c. Tingling in her right foot has improved. She has occasional dizziness but none today. Feels thirsty. Has been taking craving grapefruit lately. Otherwise she has no specific complaints. Denies fever, chills, cough, chest pain, dyspnea, leg edema, or mucositis.    MEDICAL HISTORY:  Past Medical History:  Diagnosis Date  . Anxiety   . Family history of colon cancer   . Family history of pancreatic cancer   . Family history of prostate cancer     SURGICAL HISTORY: Past Surgical History:  Procedure Laterality Date  . IR FLUORO GUIDE PORT INSERTION RIGHT  07/24/2017  .  IR US GUIDE VASC ACCESS RIGHT  07/24/2017    I have reviewed the social history and family history with the patient and they are unchanged from previous note.  ALLERGIES:  has No Known  Allergies.  MEDICATIONS:  Current Outpatient Medications  Medication Sig Dispense Refill  . diphenoxylate-atropine (LOMOTIL) 2.5-0.025 MG tablet TAKE 1 TABLET BY MOUTH 4 TIMES DAILY AS NEEDED FOR DIARRHEA OR  LOOSE  STOOLS 30 tablet 2  . dronabinol (MARINOL) 2.5 MG capsule TAKE 1 CAPSULE BY MOUTH TWICE DAILY BEFORE MEAL(S) 60 capsule 0  . LORazepam (ATIVAN) 1 MG tablet Take 1 mg 2 (two) times daily by mouth.    . mirtazapine (REMERON) 15 MG tablet Take 1 tablet (15 mg total) by mouth at bedtime. 30 tablet 2  . Multiple Vitamin (MULTIVITAMIN WITH MINERALS) TABS tablet Take 1 tablet daily by mouth. One-A-Day for Women    . potassium chloride SA (K-DUR,KLOR-CON) 20 MEQ tablet Take 1 tablet (20 mEq total) by mouth daily. 40 tablet 1   Current Facility-Administered Medications  Medication Dose Route Frequency Provider Last Rate Last Dose  . 0.9 %  sodium chloride infusion   Intravenous Once Alla Feeling, NP       Facility-Administered Medications Ordered in Other Visits  Medication Dose Route Frequency Provider Last Rate Last Dose  . 0.9 %  sodium chloride infusion   Intravenous Once Alla Feeling, NP 999 mL/hr at 06/19/18 1204      PHYSICAL EXAMINATION: ECOG PERFORMANCE STATUS: 1 - Symptomatic but completely ambulatory  Vitals:   06/19/18 1058  BP: 90/67  Pulse: 69  Resp: 16  Temp: 97.7 F (36.5 C)   Filed Weights   06/19/18 1058  Weight: 77 lb (34.9 kg)    GENERAL:alert, no distress and comfortable SKIN: skin is dry without rashes or significant lesions EYES: sclera clear OROPHARYNX:no thrush or ulcers LYMPH:  no palpable cervical or supraclavicular lymphadenopathy LUNGS: clear to auscultation with normal breathing effort HEART: regular rate & rhythm, no lower extremity edema ABDOMEN:abdomen soft, non-tender and normal bowel sounds Musculoskeletal:no cyanosis of digits and no clubbing  NEURO: alert & oriented x 3 with fluent speech, no focal motor/sensory deficits PAC  without erythema    LABORATORY DATA:  I have reviewed the data as listed CBC Latest Ref Rng & Units 06/19/2018 06/05/2018 05/22/2018  WBC 4.0 - 10.5 K/uL 7.1 6.3 6.0  Hemoglobin 12.0 - 15.0 g/dL 11.2(L) 11.5(L) 11.8  Hematocrit 36.0 - 46.0 % 33.5(L) 34.3(L) 34.9  Platelets 150 - 400 K/uL 123(L) 133(L) 172     CMP Latest Ref Rng & Units 06/19/2018 06/05/2018 05/22/2018  Glucose 70 - 99 mg/dL 120(H) 89 95  BUN 8 - 23 mg/dL _0 Creatinine 0.44 - 1.00 mg/dL 0.89 0.78 0.78  Sodium 135 - 145 mmol/L 140 140 141  Potassium 3.5 - 5.1 mmol/L 4.1 3.9 4.6  Chloride 98 - 111 mmol/L 112(H) 115(H) 109  CO2 22 - 32 mmol/L 21(L) 19(L) 23  Calcium 8.9 - 10.3 mg/dL 8.5(L) 8.9 9.5  Total Protein 6.5 - 8.1 g/dL 6.7 7.3 7.5  Total Bilirubin 0.3 - 1.2 mg/dL 0.3 0.3 0.3  Alkaline Phos 38 - 126 U/L 68 80 74  AST 15 - 41 U/L 29 43(H) 26  ALT 0 - 44 U/L 40 63(H) 34      RADIOGRAPHIC STUDIES: I have personally reviewed the radiological images as listed and agreed with the findings in the report. No results found.  ASSESSMENT & PLAN:  1. 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 Anorexia and weight loss. On Marinol and mirtazapine 3. Diarrhea, controlled with lomotil 4. Smoking cessation 5. Goals of care 6. Peripheral neuropathy involving feet, grade 1  Ms. Moxley is clinically stable. She completed 3 cycles FOLFOX. She tolerates treatment moderately well, with loose stool which improved from last cycle. Neuropathy has improved. Weight is stable. BP mildly low today, she is occasionally dizzy. Will support with 1 liter NS today during treatment. I encouraged her to drink more. She is not on BP med. CBC and CMP are stable. CA 19-9 was normal at diagnosis, we do not follow. Proceed with cycle 4 today at current dose. Given her overall good tolerance and she is clinically stable, will restage after cycle 5 or 6. She will return in 2 weeks for f/u and  cycle 5. She will see dietician today.   She has been eating a lot of grapefruit/juice lately, I checked her meds to identify any interactions. I reviewed with the patient there may be enhanced effects of mirtazapine. Her appetite has improved lately, no other negative side effects. She will continue.   PLAN: -Labs reviewed, proceed with cycle 4 FOLFOX at current dose -Dietician f/u today -Increase po intake -1 liter NS over 2 hours with treatment today  -F/u in 2 weeks with cycle 5 -Restage after cycle 5 or 6   All questions were answered. The patient knows to call the clinic with any problems, questions or concerns. No barriers to learning was detected.     Alla Feeling, NP 06/19/18

## 2018-06-19 NOTE — Telephone Encounter (Signed)
Change f/u 10/13 to YF if possible / IB message to [facility] regarding no available appts per 10/30 los

## 2018-06-19 NOTE — Addendum Note (Signed)
Addended by: Truitt Merle on: 06/19/2018 12:15 PM   Modules accepted: Orders

## 2018-06-21 ENCOUNTER — Inpatient Hospital Stay: Payer: Medicare Other | Attending: Hematology

## 2018-06-21 VITALS — BP 96/54 | HR 71 | Temp 97.5°F | Resp 16

## 2018-06-21 DIAGNOSIS — R634 Abnormal weight loss: Secondary | ICD-10-CM | POA: Diagnosis not present

## 2018-06-21 DIAGNOSIS — F17210 Nicotine dependence, cigarettes, uncomplicated: Secondary | ICD-10-CM | POA: Diagnosis not present

## 2018-06-21 DIAGNOSIS — I700 Atherosclerosis of aorta: Secondary | ICD-10-CM | POA: Insufficient documentation

## 2018-06-21 DIAGNOSIS — R5383 Other fatigue: Secondary | ICD-10-CM | POA: Insufficient documentation

## 2018-06-21 DIAGNOSIS — J439 Emphysema, unspecified: Secondary | ICD-10-CM | POA: Insufficient documentation

## 2018-06-21 DIAGNOSIS — R630 Anorexia: Secondary | ICD-10-CM | POA: Insufficient documentation

## 2018-06-21 DIAGNOSIS — G620 Drug-induced polyneuropathy: Secondary | ICD-10-CM | POA: Insufficient documentation

## 2018-06-21 DIAGNOSIS — Z79899 Other long term (current) drug therapy: Secondary | ICD-10-CM | POA: Insufficient documentation

## 2018-06-21 DIAGNOSIS — Z8042 Family history of malignant neoplasm of prostate: Secondary | ICD-10-CM | POA: Diagnosis not present

## 2018-06-21 DIAGNOSIS — C258 Malignant neoplasm of overlapping sites of pancreas: Secondary | ICD-10-CM | POA: Diagnosis not present

## 2018-06-21 DIAGNOSIS — C259 Malignant neoplasm of pancreas, unspecified: Secondary | ICD-10-CM

## 2018-06-21 DIAGNOSIS — Z7189 Other specified counseling: Secondary | ICD-10-CM

## 2018-06-21 DIAGNOSIS — R7989 Other specified abnormal findings of blood chemistry: Secondary | ICD-10-CM | POA: Insufficient documentation

## 2018-06-21 DIAGNOSIS — Z5111 Encounter for antineoplastic chemotherapy: Secondary | ICD-10-CM | POA: Diagnosis not present

## 2018-06-21 DIAGNOSIS — T451X5S Adverse effect of antineoplastic and immunosuppressive drugs, sequela: Secondary | ICD-10-CM | POA: Insufficient documentation

## 2018-06-21 DIAGNOSIS — C787 Secondary malignant neoplasm of liver and intrahepatic bile duct: Secondary | ICD-10-CM | POA: Diagnosis not present

## 2018-06-21 DIAGNOSIS — R197 Diarrhea, unspecified: Secondary | ICD-10-CM | POA: Insufficient documentation

## 2018-06-21 DIAGNOSIS — Z800 Family history of malignant neoplasm of digestive organs: Secondary | ICD-10-CM | POA: Diagnosis not present

## 2018-06-21 MED ORDER — SODIUM CHLORIDE 0.9% FLUSH
10.0000 mL | INTRAVENOUS | Status: DC | PRN
  Administered 2018-06-21: 10 mL
  Filled 2018-06-21: qty 10

## 2018-06-21 MED ORDER — HEPARIN SOD (PORK) LOCK FLUSH 100 UNIT/ML IV SOLN
500.0000 [IU] | Freq: Once | INTRAVENOUS | Status: AC | PRN
  Administered 2018-06-21: 500 [IU]
  Filled 2018-06-21: qty 5

## 2018-07-02 NOTE — Progress Notes (Signed)
[facility]  [phone] [phone]  Clinic Follow up Note   Patient Care Team: Wilford Corner, MD as PCP - General (Gastroenterology) Truitt Merle, MD as Consulting Physician (Hematology) Date of Service: 07/03/18   SUMMARY OF ONCOLOGIC HISTORY:   Pancreatic cancer metastasized to liver ([place])   06/21/2017 Imaging    CT CAP IMPRESSION: 1. Indistinct low-attenuation mass in the uncinate process of the pancreas with dilatation of remainder of the pancreatic duct and some encasement of the SMA. These findings are highly indicative of pancreatic carcinoma. Recommend MRI to assess further. 2. Multiple low-attenuation lesions throughout the right and left lobes of liver most consistent with liver metastasis again MRI may be helpful. 3. Moderate change of abdominal aortic atherosclerosis. 4. Changes of centrilobular and paraseptal emphysema on CT of the chest. No lung lesion is seen.     07/06/2017 Initial Biopsy    Diagnosis Liver, needle/core biopsy, Right lobe - METASTATIC ADENOCARCINOMA.    07/10/2017 Initial Diagnosis    Pancreatic cancer metastasized to liver ([place])    07/25/2017 -  Chemotherapy    Gemcitabine and Abraxane 3 weeks on and 1 week off starting 07/25/17     10/02/2017 Imaging    CT CAP W Contrast 10/02/17 IMPRESSION: 1. Reduced size of the primary pancreatic neoplasm, which still continues to encase the superior mesenteric artery and occlude the superior mesenteric vein. The numerous metastatic liver lesions show some improvement on portal venous phase images, with increase in the central necrosis and slight reduction in size. However, the arterial phase images, which are present today but were not available on the prior exam, demonstrate that there is a significant peripheral rind around each of these lesions and that the portal venous phase images underestimate total lesions size. No findings of metastatic disease to the  chest or skeleton. 2. Severe and worsened cachexia. 3. Low-grade mesenteric edema and a small amount of pelvic ascites. 4. Aortic Atherosclerosis (ICD10-I70.0) and Emphysema (ICD10-J43.9). 5.  Prominent stool throughout the colon favors constipation.    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  The Hereditary Gene Panel offered by [facility] includes sequencing and/or deletion duplication testing of the following 47 genes: APC, ATM, AXIN2, BARD1, BMPR1A, BRCA1, BRCA2, BRIP1, CDH1, CDK4, CDKN2A (p14ARF), CDKN2A (p16INK4a), CHEK2, CTNNA1, DICER1, EPCAM (Deletion/duplication testing only), GREM1 (promoter region deletion/duplication testing only), KIT, MEN1, MLH1, MSH2, MSH3, MSH6, MUTYH, NBN, NF1, NHTL1, PALB2, PDGFRA, PMS2, POLD1, POLE, PTEN, RAD50, RAD51C, RAD51D, SDHB, SDHC, SDHD, SMAD4, SMARCA4. STK11, TP53, TSC1, TSC2, and VHL.  The following genes were evaluated for sequence changes only: SDHA and HOXB13 c.251G>A variant only. The report date is November 27, 2017.     01/08/2018 Imaging    CT CAP W Contrast 01/08/18  IMPRESSION: 1. Overall, there appears to be slight progression of disease, as evidenced by slight growth of the primary pancreatic lesion. While some of the previously noted hepatic lesions appear to have resolved or gotten smaller, others appear to be new or are slightly larger than the prior examination, as detailed above. 2. Several unusual appearing pulmonary nodules scattered throughout the lungs bilaterally, some of which are solid while others are more subsolid in appearance. Although these could be infectious or inflammatory in etiology, the possibility of developing metastatic disease in the lungs should be considered, and close attention on follow-up studies is recommended. 3. Aortic atherosclerosis. 4. Additional incidental findings, as above. Aortic  Atherosclerosis  (ICD10-I70.0).     02/04/2018 - 04/15/2018 Chemotherapy    The patient had leucovorin 544 mg in dextrose 5 % 250 mL infusion, 400 mg/m2 = 544 mg, Intravenous,  Once, 5 of 6 cycles Administration: 544 mg (02/05/2018), 544 mg (02/19/2018), 544 mg (03/05/2018), 544 mg (03/21/2018), 544 mg (04/02/2018) fluorouracil (ADRUCIL) 3,250 mg in sodium chloride 0.9 % 85 mL chemo infusion, 2,400 mg/m2 = 3,250 mg, Intravenous, 1 Day/Dose, 5 of 6 cycles Administration: 3,250 mg (02/05/2018), 3,250 mg (02/19/2018), 3,250 mg (03/05/2018), 3,250 mg (03/21/2018), 3,250 mg (04/02/2018) irinotecan LIPOSOME (ONIVYDE) 95 mg in sodium chloride 0.9 % 500 mL chemo infusion, 94.6 mg, Intravenous, Once, 5 of 6 cycles Administration: 95 mg (02/05/2018), 95 mg (02/19/2018), 95 mg (03/05/2018), 95 mg (03/21/2018), 95 mg (04/02/2018)  for chemotherapy treatment.     04/12/2018 Imaging    IMPRESSION: 1. Interval increase in size and development of multiple new low-attenuation lesions throughout the liver 2. Slight interval increase in size of primary pancreatic lesion. 3. Previously described pulmonary nodules are decreased in size or resolved when compared to prior exam, potentially resolving infectious/inflammatory process. 4. Interval increase in size of sclerotic lesion within the L2 vertebral body, potentially representing osseous metastatic disease.    04/16/2018 -  Chemotherapy    The patient had palonosetron (ALOXI) injection 0.25 mg, 0.25 mg, Intravenous,  Once, 4 of 8 cycles Administration: 0.25 mg (04/27/2018), 0.25 mg (05/22/2018), 0.25 mg (06/05/2018), 0.25 mg (06/19/2018) leucovorin 528 mg in dextrose 5 % 250 mL infusion, 400 mg/m2 = 528 mg, Intravenous,  Once, 4 of 8 cycles Administration: 528 mg (04/27/2018), 528 mg (05/22/2018), 528 mg (06/05/2018), 528 mg (06/19/2018) oxaliplatin (ELOXATIN) 110 mg in dextrose 5 % 500 mL chemo infusion, 85 mg/m2 = 110 mg, Intravenous,  Once, 4 of 8 cycles Dose modification: 70 mg/m2 (original dose 85  mg/m2, Cycle 2, Reason: Provider Judgment, Comment: poor tolerance ) Administration: 110 mg (04/27/2018), 90 mg (05/22/2018), 90 mg (06/05/2018), 90 mg (06/19/2018) fluorouracil (ADRUCIL) 3,150 mg in sodium chloride 0.9 % 87 mL chemo infusion, 2,400 mg/m2 = 3,150 mg, Intravenous, 1 Day/Dose, 4 of 8 cycles Dose modification: 2,200 mg/m2 (original dose 2,400 mg/m2, Cycle 2, Reason: Provider Judgment, Comment: poor tolerance ) Administration: 3,150 mg (04/27/2018), 2,900 mg (05/22/2018), 2,900 mg (06/05/2018), 2,900 mg (06/19/2018)  for chemotherapy treatment.    PRIORTHERAPY: 1. 1st line gemcitabine/abraxane on days 1 and 8 from 07/25/17 - 01/22/18 s/p 9 cycles  2.Second line5-fu pump and liposomal irinotecan every 2 weeks started on 02/05/2018, discontinued 04/17/18 due to disease progression.S/p 5 cycles   CURRENT THERAPY:3rd line FOLFOXbegan 04/27/18  INTERVAL HISTORY: Ms. Estell returns for follow up and cycle 5 FOLFOX as scheduled. She completed cycle 4 on 06/19/18. She had much difficulty with last cycle, with increased fatigue and lethargy. She did not get out of bed much for 2 weeks and only started to feel better yesterday. 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 She did continue to eat and drink; she feels her appetite has improved. Takes marinol before meals and mirtazapine at night. She continues to have diarrhea, up to 4 per day. Takes lomotil before meals and immodium after meals and with loose stool. Denies blood in her stool. She has gas pain but denies abdominal pain otherwise. Denies dark urine. Denies fever, chills, cough, chest pain, dyspnea, or leg edema. Neuropathy is right foot has improved, does not notice it at night anymore.    MEDICAL HISTORY:  Past Medical History:  Diagnosis Date  . Anxiety   . Family history of colon cancer   . Family history of pancreatic cancer   . Family history of prostate cancer     SURGICAL HISTORY: Past Surgical  History:  Procedure Laterality Date  . IR FLUORO GUIDE PORT INSERTION RIGHT  07/24/2017  . IR US GUIDE VASC ACCESS RIGHT  07/24/2017    I have reviewed the social history and family history with the patient and they are unchanged from previous note.  ALLERGIES:  has No Known Allergies.  MEDICATIONS:  Current Outpatient Medications  Medication Sig Dispense Refill  . diphenoxylate-atropine (LOMOTIL) 2.5-0.025 MG tablet TAKE 1 TABLET BY MOUTH 4 TIMES DAILY AS NEEDED FOR DIARRHEA OR  LOOSE  STOOLS 30 tablet 2  . dronabinol (MARINOL) 2.5 MG capsule TAKE 1 CAPSULE BY MOUTH TWICE DAILY BEFORE MEAL(S) 60 capsule 0  . LORazepam (ATIVAN) 1 MG tablet Take 1 mg 2 (two) times daily by mouth.    . mirtazapine (REMERON) 15 MG tablet Take 1 tablet (15 mg total) by mouth at bedtime. 30 tablet 2  . Multiple Vitamin (MULTIVITAMIN WITH MINERALS) TABS tablet Take 1 tablet daily by mouth. One-A-Day for Women    . potassium chloride SA (K-DUR,KLOR-CON) 20 MEQ tablet Take 1 tablet (20 mEq total) by mouth daily. 40 tablet 1  . Pancrelipase, Lip-Prot-Amyl, (CREON) 24000-76000 units CPEP Take 1 capsule (24,000 Units total) by mouth 3 (three) times daily before meals. 180 capsule 2   No current facility-administered medications for this visit.     PHYSICAL EXAMINATION: ECOG PERFORMANCE STATUS: 2-3  Vitals:   07/03/18 0849  BP: 90/65  Pulse: 70  Resp: 17  Temp: 97.7 F (36.5 C)  SpO2: 100%   Filed Weights   07/03/18 0849  Weight: 75 lb 8 oz (34.2 kg)    GENERAL:alert, no distress and comfortable SKIN:  no rashes or significant lesions. Palms dry with hyperpigmentation EYES:  sclera clear OROPHARYNX:no thrush or ulcers LYMPH:  no palpable cervical or supraclavicular lymphadenopathy LUNGS: clear to auscultation with normal breathing effort HEART: regular rate & rhythm, no lower extremity edema ABDOMEN:abdomen soft, non-tender and normal bowel sounds. Fullness to epigastrium  Musculoskeletal:no  cyanosis of digits and no clubbing  NEURO: alert & oriented x 3 with fluent speech, no focal motor/sensory deficits PAC without erythema    LABORATORY DATA:  I have reviewed the data as listed CBC Latest Ref Rng & Units 07/03/2018 06/19/2018 06/05/2018  WBC 4.0 - 10.5 K/uL 5.7 7.1 6.3  Hemoglobin 12.0 - 15.0 g/dL 11.0(L) 11.2(L) 11.5(L)  Hematocrit 36.0 - 46.0 % 33.2(L) 33.5(L) 34.3(L)  Platelets 150 - 400 K/uL 111(L) 123(L) 133(L)     CMP Latest Ref Rng & Units 07/03/2018 06/19/2018 06/05/2018  Glucose 70 - 99 mg/dL 172(H) 120(H) 89  BUN 8 - 23 mg/dL '13 10 12  ' Creatinine 0.44 - 1.00 mg/dL 0.88 0.89 0.78  Sodium 135 - 145 mmol/L 141 140 140  Potassium 3.5 - 5.1 mmol/L 3.2(L) 4.1 3.9  Chloride 98 - 111 mmol/L 115(H) 112(H) 115(H)  CO2 22 - 32 mmol/L 18(L) 21(L) 19(L)  Calcium 8.9 - 10.3 mg/dL 8.4(L) 8.5(L) 8.9  Total Protein 6.5 - 8.1 g/dL 6.7 6.7 7.3  Total Bilirubin 0.3 - 1.2 mg/dL 0.2(L) 0.3 0.3  Alkaline Phos 38 - 126 U/L 61 68 80  AST 15 - 41 U/L 21 29 43(H)  ALT 0 - 44 U/L 34 40 63(H)  RADIOGRAPHIC STUDIES: I have personally reviewed the radiological images as listed and agreed with the findings in the report. No results found.   ASSESSMENT & PLAN:  1. 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 Anorexia and weight loss. [place] mirtazapine 3. Diarrhea, on lomotil and imodium  4. Smoking cessation 5. Goals of care 6. Peripheral neuropathy involving feet, grade 1  Tammy Adams appears stable today, but somewhat lethargic. She completed 4 cycles FOLFOX, dose-reduced from cycle 2. She did not tolerate last cycle well, with increased fatigue, weight loss, and diarrhea. She has not recovered well today. We reviewed symptom management for diarrhea. Her K is mildly low, likely due to high GI output; I recommend to take 1 tab BID x3 days, if stool output continues to be >4 more day, she will continue 2 tabs daily, if  diarrhea improves, she can resume once daily dosing. I suggest to resume creon to see if this helps diarrhea. Refilled today. Will monitor closely. I recommend to hold chemotherapy today. Her symptoms may be related to disease progression. Will restage in the next week. Will give IVF today, 1 liter over 2 hours.   PLAN: -Hold FOLFOX today -1 liter NS over 2 hours  -Increase oral K to 1 tab BID x3 days, if stool output remains 4 or more daily, continue BID dosing; if diarrhea improves to less than 4 per day, resume once daily dosing.  -Refilled Creon  -CT CAP 11/20  -f/u next week with CT results and potential treatment    Orders Placed This Encounter  Procedures  . CT Abdomen Pelvis W Contrast    Standing Status:   Future    Standing Expiration Date:   07/03/2019    Order Specific Question:   If indicated for the ordered procedure, I authorize the administration of contrast media per Radiology protocol    Answer:   Yes    Order Specific Question:   Preferred imaging location?    Answer:   [facility]    Order Specific Question:   Is Oral Contrast requested for this exam?    Answer:   Yes, Per Radiology protocol    Order Specific Question:   Radiology Contrast Protocol - do NOT remove file path    Answer:   [email or web address]  . CT Chest W Contrast    Standing Status:   Future    Standing Expiration Date:   07/03/2019    Order Specific Question:   If indicated for the ordered procedure, I authorize the administration of contrast media per Radiology protocol    Answer:   Yes    Order Specific Question:   Preferred imaging location?    Answer:   [facility]    Order Specific Question:   Radiology Contrast Protocol - do NOT remove file path    Answer:   [email or web address]   All questions were answered. The patient knows to call the clinic with any problems, questions or concerns. No barriers to learning was  detected. I spent 20 minutes counseling the patient face to face. The total time spent in the appointment was 25 minutes and more than 50% was on counseling, review of test results, and coordination of care.      Tammy Feeling, NP 07/04/18

## 2018-07-03 ENCOUNTER — Inpatient Hospital Stay: Payer: Medicare Other

## 2018-07-03 ENCOUNTER — Inpatient Hospital Stay (HOSPITAL_BASED_OUTPATIENT_CLINIC_OR_DEPARTMENT_OTHER): Payer: Medicare Other | Admitting: Nurse Practitioner

## 2018-07-03 ENCOUNTER — Encounter: Payer: Self-pay | Admitting: Nurse Practitioner

## 2018-07-03 VITALS — BP 90/65 | HR 70 | Temp 97.7°F | Resp 17 | Ht 64.0 in | Wt 75.5 lb

## 2018-07-03 DIAGNOSIS — R630 Anorexia: Secondary | ICD-10-CM

## 2018-07-03 DIAGNOSIS — I700 Atherosclerosis of aorta: Secondary | ICD-10-CM

## 2018-07-03 DIAGNOSIS — C259 Malignant neoplasm of pancreas, unspecified: Secondary | ICD-10-CM

## 2018-07-03 DIAGNOSIS — T451X5S Adverse effect of antineoplastic and immunosuppressive drugs, sequela: Secondary | ICD-10-CM

## 2018-07-03 DIAGNOSIS — Z79899 Other long term (current) drug therapy: Secondary | ICD-10-CM | POA: Diagnosis not present

## 2018-07-03 DIAGNOSIS — C787 Secondary malignant neoplasm of liver and intrahepatic bile duct: Secondary | ICD-10-CM | POA: Diagnosis not present

## 2018-07-03 DIAGNOSIS — G620 Drug-induced polyneuropathy: Secondary | ICD-10-CM

## 2018-07-03 DIAGNOSIS — J439 Emphysema, unspecified: Secondary | ICD-10-CM

## 2018-07-03 DIAGNOSIS — Z800 Family history of malignant neoplasm of digestive organs: Secondary | ICD-10-CM

## 2018-07-03 DIAGNOSIS — D490 Neoplasm of unspecified behavior of digestive system: Secondary | ICD-10-CM

## 2018-07-03 DIAGNOSIS — C258 Malignant neoplasm of overlapping sites of pancreas: Secondary | ICD-10-CM | POA: Diagnosis not present

## 2018-07-03 DIAGNOSIS — R197 Diarrhea, unspecified: Secondary | ICD-10-CM | POA: Diagnosis not present

## 2018-07-03 DIAGNOSIS — R7989 Other specified abnormal findings of blood chemistry: Secondary | ICD-10-CM | POA: Diagnosis not present

## 2018-07-03 DIAGNOSIS — Z8042 Family history of malignant neoplasm of prostate: Secondary | ICD-10-CM

## 2018-07-03 DIAGNOSIS — Z5111 Encounter for antineoplastic chemotherapy: Secondary | ICD-10-CM | POA: Diagnosis not present

## 2018-07-03 DIAGNOSIS — R634 Abnormal weight loss: Secondary | ICD-10-CM | POA: Diagnosis not present

## 2018-07-03 LAB — COMPREHENSIVE METABOLIC PANEL
ALK PHOS: 61 U/L (ref 38–126)
ALT: 34 U/L (ref 0–44)
AST: 21 U/L (ref 15–41)
Albumin: 3.4 g/dL — ABNORMAL LOW (ref 3.5–5.0)
Anion gap: 8 (ref 5–15)
BUN: 13 mg/dL (ref 8–23)
CALCIUM: 8.4 mg/dL — AB (ref 8.9–10.3)
CO2: 18 mmol/L — AB (ref 22–32)
CREATININE: 0.88 mg/dL (ref 0.44–1.00)
Chloride: 115 mmol/L — ABNORMAL HIGH (ref 98–111)
GFR calc Af Amer: >60 mL/min (ref >60)
Glucose, Bld: 172 mg/dL — ABNORMAL HIGH (ref 70–99)
Potassium: 3.2 mmol/L — ABNORMAL LOW (ref 3.5–5.1)
SODIUM: 141 mmol/L (ref 135–145)
Total Bilirubin: 0.2 mg/dL — ABNORMAL LOW (ref 0.3–1.2)
Total Protein: 6.7 g/dL (ref 6.5–8.1)

## 2018-07-03 LAB — CBC WITH DIFFERENTIAL/PLATELET
Abs Immature Granulocytes: 0.02 10*3/uL (ref 0.00–0.07)
BASOS PCT: 0 %
Basophils Absolute: 0 10*3/uL (ref 0.0–0.1)
EOS ABS: 0.1 10*3/uL (ref 0.0–0.5)
EOS PCT: 1 %
HCT: 33.2 % — ABNORMAL LOW (ref 36.0–46.0)
HEMOGLOBIN: 11 g/dL — AB (ref 12.0–15.0)
Immature Granulocytes: 0 %
Lymphocytes Relative: 25 %
Lymphs Abs: 1.4 10*3/uL (ref 0.7–4.0)
MCH: 33.6 pg (ref 26.0–34.0)
MCHC: 33.1 g/dL (ref 30.0–36.0)
MCV: 101.5 fL — ABNORMAL HIGH (ref 80.0–100.0)
MONO ABS: 0.4 10*3/uL (ref 0.1–1.0)
Monocytes Relative: 6 %
NRBC: 0 % (ref 0.0–0.2)
Neutro Abs: 3.8 10*3/uL (ref 1.7–7.7)
Neutrophils Relative %: 68 %
PLATELETS: 111 10*3/uL — AB (ref 150–400)
RBC: 3.27 MIL/uL — AB (ref 3.87–5.11)
RDW: 14.6 % (ref 11.5–15.5)
WBC: 5.7 10*3/uL (ref 4.0–10.5)

## 2018-07-03 MED ORDER — PANCRELIPASE (LIP-PROT-AMYL) 24000-76000 UNITS PO CPEP: 1.0000 | ORAL_CAPSULE | Freq: Three times a day (TID) | ORAL | 2 refills | Status: AC

## 2018-07-03 MED ORDER — SODIUM CHLORIDE 0.9% FLUSH
10.0000 mL | Freq: Once | INTRAVENOUS | Status: AC
  Administered 2018-07-03: 10 mL
  Filled 2018-07-03: qty 10

## 2018-07-03 MED ORDER — SODIUM CHLORIDE 0.9 % IV SOLN
INTRAVENOUS | Status: DC
  Administered 2018-07-03: 10:00:00 via INTRAVENOUS
  Filled 2018-07-03 (×2): qty 250

## 2018-07-03 MED ORDER — SODIUM CHLORIDE 0.9 % IV SOLN
Freq: Once | INTRAVENOUS | Status: AC
  Filled 2018-07-03: qty 250

## 2018-07-03 MED ORDER — HEPARIN SOD (PORK) LOCK FLUSH 100 UNIT/ML IV SOLN
500.0000 [IU] | Freq: Once | INTRAVENOUS | Status: AC
  Administered 2018-07-03: 500 [IU]
  Filled 2018-07-03: qty 5

## 2018-07-03 NOTE — Patient Instructions (Signed)
Dehydration, Adult Dehydration is when there is not enough fluid or water in your body. This happens when you lose more fluids than you take in. Dehydration can range from mild to very bad. It should be treated right away to keep it from getting very bad. Symptoms of mild dehydration may include:  Thirst.  Dry lips.  Slightly dry mouth.  Dry, warm skin.  Dizziness. Symptoms of moderate dehydration may include:  Very dry mouth.  Muscle cramps.  Dark pee (urine). Pee may be the color of tea.  Your body making less pee.  Your eyes making fewer tears.  Heartbeat that is uneven or faster than normal (palpitations).  Headache.  Light-headedness, especially when you stand up from sitting.  Fainting (syncope). Symptoms of very bad dehydration may include:  Changes in skin, such as: ? Cold and clammy skin. ? Blotchy (mottled) or pale skin. ? Skin that does not quickly return to normal after being lightly pinched and let go (poor skin turgor).  Changes in body fluids, such as: ? Feeling very thirsty. ? Your eyes making fewer tears. ? Not sweating when body temperature is high, such as in hot weather. ? Your body making very little pee.  Changes in vital signs, such as: ? Weak pulse. ? Pulse that is more than 100 beats a minute when you are sitting still. ? Fast breathing. ? Low blood pressure.  Other changes, such as: ? Sunken eyes. ? Cold hands and feet. ? Confusion. ? Lack of energy (lethargy). ? Trouble waking up from sleep. ? Short-term weight loss. ? Unconsciousness. Follow these instructions at home:  If told by your doctor, drink an ORS: ? Make an ORS by using instructions on the package. ? Start by drinking small amounts, about  cup (120 mL) every 5-10 minutes. ? Slowly drink more until you have had the amount that your doctor said to have.  Drink enough clear fluid to keep your pee clear or pale yellow. If you were told to drink an ORS, finish the ORS  first, then start slowly drinking clear fluids. Drink fluids such as: ? Water. Do not drink only water by itself. Doing that can make the salt (sodium) level in your body get too low (hyponatremia). ? Ice chips. ? Fruit juice that you have added water to (diluted). ? Low-calorie sports drinks.  Avoid: ? Alcohol. ? Drinks that have a lot of sugar. These include high-calorie sports drinks, fruit juice that does not have water added, and soda. ? Caffeine. ? Foods that are greasy or have a lot of fat or sugar.  Take over-the-counter and prescription medicines only as told by your doctor.  Do not take salt tablets. Doing that can make the salt level in your body get too high (hypernatremia).  Eat foods that have minerals (electrolytes). Examples include bananas, oranges, potatoes, tomatoes, and spinach.  Keep all follow-up visits as told by your doctor. This is important. Contact a doctor if:  You have belly (abdominal) pain that: ? Gets worse. ? Stays in one area (localizes).  You have a rash.  You have a stiff neck.  You get angry or annoyed more easily than normal (irritability).  You are more sleepy than normal.  You have a harder time waking up than normal.  You feel: ? Weak. ? Dizzy. ? Very thirsty.  You have peed (urinated) only a small amount of very dark pee during 6-8 hours. Get help right away if:  You have symptoms of   very bad dehydration.  You cannot drink fluids without throwing up (vomiting).  Your symptoms get worse with treatment.  You have a fever.  You have a very bad headache.  You are throwing up or having watery poop (diarrhea) and it: ? Gets worse. ? Does not go away.  You have blood or something green (bile) in your throw-up.  You have blood in your poop (stool). This may cause poop to look black and tarry.  You have not peed in 6-8 hours.  You pass out (faint).  Your heart rate when you are sitting still is more than 100 beats a  minute.  You have trouble breathing. This information is not intended to replace advice given to you by your health care provider. Make sure you discuss any questions you have with your health care provider. Document Released: 06/03/2009 Document Revised: 02/25/2016 Document Reviewed: 10/01/2015 Elsevier Interactive Patient Education  2018 [employer]  

## 2018-07-03 NOTE — Progress Notes (Signed)
Encounter opened in error

## 2018-07-04 ENCOUNTER — Encounter: Payer: Self-pay | Admitting: Nurse Practitioner

## 2018-07-05 ENCOUNTER — Inpatient Hospital Stay: Payer: Medicare Other

## 2018-07-10 ENCOUNTER — Ambulatory Visit (HOSPITAL_COMMUNITY)
Admission: RE | Admit: 2018-07-10 | Discharge: 2018-07-10 | Disposition: A | Discharge status: Home / Self Care | Payer: Medicare Other | Source: Ambulatory Visit | Attending: Nurse Practitioner | Admitting: Nurse Practitioner

## 2018-07-10 ENCOUNTER — Encounter (HOSPITAL_COMMUNITY): Payer: Self-pay

## 2018-07-10 DIAGNOSIS — I81 Portal vein thrombosis: Secondary | ICD-10-CM | POA: Diagnosis not present

## 2018-07-10 DIAGNOSIS — C787 Secondary malignant neoplasm of liver and intrahepatic bile duct: Secondary | ICD-10-CM | POA: Diagnosis not present

## 2018-07-10 DIAGNOSIS — M899 Disorder of bone, unspecified: Secondary | ICD-10-CM | POA: Insufficient documentation

## 2018-07-10 DIAGNOSIS — C801 Malignant (primary) neoplasm, unspecified: Secondary | ICD-10-CM | POA: Insufficient documentation

## 2018-07-10 DIAGNOSIS — K838 Other specified diseases of biliary tract: Secondary | ICD-10-CM | POA: Diagnosis not present

## 2018-07-10 DIAGNOSIS — D490 Neoplasm of unspecified behavior of digestive system: Secondary | ICD-10-CM | POA: Diagnosis present

## 2018-07-10 DIAGNOSIS — K861 Other chronic pancreatitis: Secondary | ICD-10-CM | POA: Insufficient documentation

## 2018-07-10 DIAGNOSIS — K869 Disease of pancreas, unspecified: Secondary | ICD-10-CM | POA: Diagnosis not present

## 2018-07-10 DIAGNOSIS — C7889 Secondary malignant neoplasm of other digestive organs: Secondary | ICD-10-CM | POA: Diagnosis not present

## 2018-07-10 HISTORY — DX: Malignant (primary) neoplasm, unspecified: C80.1

## 2018-07-10 MED ORDER — SODIUM CHLORIDE (PF) 0.9 % IJ SOLN
INTRAMUSCULAR | Status: AC
  Filled 2018-07-10: qty 50

## 2018-07-10 MED ORDER — IOHEXOL 300 MG/ML  SOLN
75.0000 mL | Freq: Once | INTRAMUSCULAR | Status: AC | PRN
  Administered 2018-07-10: 75 mL via INTRAVENOUS

## 2018-07-16 NOTE — Progress Notes (Signed)
[place]   Telephone:(336) [phone] [phone]   Clinic Follow up Note   Patient Care Team: Wilford Corner, MD as PCP - General (Gastroenterology) Truitt Merle, MD as Consulting Physician (Hematology)  Date of Service:  07/17/2018  CHIEF COMPLAINT: F/u of Pancreatic cancer metastatic to Liver, Review recent CT scan   SUMMARY OF ONCOLOGIC HISTORY: Oncology History   Cancer Staging Pancreatic cancer metastasized to liver [facility]) Staging form: Exocrine Pancreas, AJCC 8th Edition - Clinical stage from 07/06/2017: Stage IV (cTX, cNX, pM1) - Signed by Truitt Merle, MD on 07/23/2017       Pancreatic cancer metastasized to liver ([place])   06/21/2017 Imaging    CT CAP IMPRESSION: 1. Indistinct low-attenuation mass in the uncinate process of the pancreas with dilatation of remainder of the pancreatic duct and some encasement of the SMA. These findings are highly indicative of pancreatic carcinoma. Recommend MRI to assess further. 2. Multiple low-attenuation lesions throughout the right and left lobes of liver most consistent with liver metastasis again MRI may be helpful. 3. Moderate change of abdominal aortic atherosclerosis. 4. Changes of centrilobular and paraseptal emphysema on CT of the chest. No lung lesion is seen.     07/06/2017 Initial Biopsy    Diagnosis Liver, needle/core biopsy, Right lobe - METASTATIC ADENOCARCINOMA.    07/10/2017 Initial Diagnosis    Pancreatic cancer metastasized to liver ([place])    07/25/2017 - 01/22/2018 Chemotherapy    Gemcitabine and Abraxane 3 weeks on and 1 week off starting 07/25/17 and stopped 01/22/18 due to disease progression.    10/02/2017 Imaging    CT CAP W Contrast 10/02/17 IMPRESSION: 1. Reduced size of the primary pancreatic neoplasm, which still continues to encase the superior mesenteric artery and occlude the superior mesenteric vein. The numerous metastatic liver lesions show some improvement on portal venous  phase images, with increase in the central necrosis and slight reduction in size. However, the arterial phase images, which are present today but were not available on the prior exam, demonstrate that there is a significant peripheral rind around each of these lesions and that the portal venous phase images underestimate total lesions size. No findings of metastatic disease to the chest or skeleton. 2. Severe and worsened cachexia. 3. Low-grade mesenteric edema and a small amount of pelvic ascites. 4. Aortic Atherosclerosis (ICD10-I70.0) and Emphysema (ICD10-J43.9). 5.  Prominent stool throughout the colon favors constipation.    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  The Hereditary Gene Panel offered by [facility] includes sequencing and/or deletion duplication testing of the following 47 genes: APC, ATM, AXIN2, BARD1, BMPR1A, BRCA1, BRCA2, BRIP1, CDH1, CDK4, CDKN2A (p14ARF), CDKN2A (p16INK4a), CHEK2, CTNNA1, DICER1, EPCAM (Deletion/duplication testing only), GREM1 (promoter region deletion/duplication testing only), KIT, MEN1, MLH1, MSH2, MSH3, MSH6, MUTYH, NBN, NF1, NHTL1, PALB2, PDGFRA, PMS2, POLD1, POLE, PTEN, RAD50, RAD51C, RAD51D, SDHB, SDHC, SDHD, SMAD4, SMARCA4. STK11, TP53, TSC1, TSC2, and VHL.  The following genes were evaluated for sequence changes only: SDHA and HOXB13 c.251G>A variant only. The report date is November 27, 2017.     01/08/2018 Imaging    CT CAP W Contrast 01/08/18  IMPRESSION: 1. Overall, there appears to be slight progression of disease, as evidenced by slight growth of the primary pancreatic lesion. While some of the previously noted hepatic lesions appear to have resolved or gotten smaller, others appear to be new or are slightly larger than the prior examination,  as detailed above. 2. Several unusual appearing pulmonary nodules scattered throughout the lungs  bilaterally, some of which are solid while others are more subsolid in appearance. Although these could be infectious or inflammatory in etiology, the possibility of developing metastatic disease in the lungs should be considered, and close attention on follow-up studies is recommended. 3. Aortic atherosclerosis. 4. Additional incidental findings, as above. Aortic Atherosclerosis (ICD10-I70.0).     02/04/2018 - 04/02/2018 Chemotherapy    Second line 5-fu pump and liposomal irinotecan every 2 weeks started on 02/05/2018 and due to disease progression, stopped on 04/02/18    04/12/2018 Imaging    IMPRESSION: 1. Interval increase in size and development of multiple new low-attenuation lesions throughout the liver 2. Slight interval increase in size of primary pancreatic lesion. 3. Previously described pulmonary nodules are decreased in size or resolved when compared to prior exam, potentially resolving infectious/inflammatory process. 4. Interval increase in size of sclerotic lesion within the L2 vertebral body, potentially representing osseous metastatic disease.    04/27/2018 - 07/17/2018 Chemotherapy    3rd line FOLFOX q2 weeks starting 04/27/18. Dose reduced from cycle 2. Stopped after 07/17/18 due to slight disease progression    07/10/2018 Imaging    CT CAP W Contrast 07/10/18 IMPRESSION: 1. Again seen are multifocal liver metastases. Liver lesions are stable or minimally increased in size in the interval. 2. Interval decrease in size of primary pancreatic lesion. 3. Moderate to marked intrahepatic and proximal common bile duct dilatation. There is a new cystic mass within the head of pancreas which appears to have mass effect upon the distal CBD. Given the advanced changes of chronic pancreatitis this may represent a new pseudo cysts. 4. 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 The portal vein and  splenic veins remain patent. 5. Stable to mild increase in size of L2 sclerotic lesion.      CURRENT THERAPY:  3rd line FOLFOX q2 weeks starting 04/27/18, plan to change docetaxel, gemcitabine and Xeloda in January 2020  INTERVAL HISTORY:  Tammy Adams is here for a follow up and review recent CT Scan. She presents to the infusion room today by herself. She notes she is doing better including her appetite and activities. She notes her 2nd cycle she was down with fatigue for 2 days and recovered slowly. Latest cycle was more tolerable. She notes resolution of the numbness in her feet and fingers. She notes her abdominal pain is not there and her bowels are doing well. This has been helped with Creon. She notes this is very expensive and her daughter has been paying a $1000 so she stopped taking it. She denies issues with her PAC and denies fever or chills.   REVIEW OF SYSTEMS:  Constitutional: Denies fevers, chills or abnormal weight loss Eyes: Denies blurriness of vision Ears, nose, mouth, throat, and face: Denies mucositis or sore throat Respiratory: Denies cough, dyspnea or wheezes Cardiovascular: Denies palpitation, chest discomfort or lower extremity swelling Gastrointestinal:  Denies nausea, heartburn or change in bowel habits (+) stable loose stool Skin: Denies abnormal skin rashes Lymphatics: Denies new lymphadenopathy or easy bruising Neurological:Denies numbness, tingling or new weaknesses (+) neuropathy resolved Behavioral/Psych: Mood is stable, no new changes  All other systems were reviewed with the patient and are negative.  MEDICAL HISTORY:  Past Medical History:  Diagnosis Date  . Anxiety   . Family history of colon cancer   . Family history of pancreatic  cancer   . Family history of prostate cancer   . pancreatic ca dx'd 06/2017    SURGICAL HISTORY: Past Surgical History:  Procedure Laterality Date  . IR FLUORO GUIDE PORT INSERTION RIGHT  07/24/2017  . IR US GUIDE  VASC ACCESS RIGHT  07/24/2017    I have reviewed the social history and family history with the patient and they are unchanged from previous note.  ALLERGIES:  has No Known Allergies.  MEDICATIONS:  Current Outpatient Medications  Medication Sig Dispense Refill  . diphenoxylate-atropine (LOMOTIL) 2.5-0.025 MG tablet TAKE 1 TABLET BY MOUTH 4 TIMES DAILY AS NEEDED FOR DIARRHEA OR  LOOSE  STOOLS 30 tablet 2  . dronabinol (MARINOL) 2.5 MG capsule TAKE 1 CAPSULE BY MOUTH TWICE DAILY BEFORE MEAL(S) 60 capsule 0  . LORazepam (ATIVAN) 1 MG tablet Take 1 mg 2 (two) times daily by mouth.    . mirtazapine (REMERON) 15 MG tablet Take 1 tablet (15 mg total) by mouth at bedtime. 30 tablet 2  . Multiple Vitamin (MULTIVITAMIN WITH MINERALS) TABS tablet Take 1 tablet daily by mouth. One-A-Day for Women    . Pancrelipase, Lip-Prot-Amyl, (CREON) 24000-76000 units CPEP Take 1 capsule (24,000 Units total) by mouth 3 (three) times daily before meals. 180 capsule 2  . potassium chloride SA (K-DUR,KLOR-CON) 20 MEQ tablet Take 1 tablet (20 mEq total) by mouth daily. 40 tablet 1   No current facility-administered medications for this visit.     PHYSICAL EXAMINATION: ECOG PERFORMANCE STATUS: 1 - Symptomatic but completely ambulatory  Vitals:   07/17/18 0942  BP: 91/61  Pulse: 73  Resp: 16  Temp: 97.6 F (36.4 C)  SpO2: 92%   Filed Weights   07/17/18 0942  Weight: 74 lb 11.2 oz (33.9 kg)    GENERAL:alert, no distress and comfortable SKIN: skin color, texture, turgor are normal, no rashes or significant lesions EYES: normal, Conjunctiva are pink and non-injected, sclera clear OROPHARYNX:no exudate, no erythema and lips, buccal mucosa, and tongue normal  NECK: supple, thyroid normal size, non-tender, without nodularity LYMPH:  no palpable lymphadenopathy in the cervical, axillary or inguinal LUNGS: clear to auscultation and percussion with normal breathing effort HEART: regular rate & rhythm and no  murmurs and no lower extremity edema ABDOMEN:abdomen soft, non-tender and normal bowel sounds Musculoskeletal:no cyanosis of digits and no clubbing  NEURO: alert & oriented x 3 with fluent speech, no focal motor/sensory deficits  LABORATORY DATA:  I have reviewed the data as listed CBC Latest Ref Rng & Units 07/17/2018 07/03/2018 06/19/2018  WBC 4.0 - 10.5 K/uL 7.7 5.7 7.1  Hemoglobin 12.0 - 15.0 g/dL 10.5(L) 11.0(L) 11.2(L)  Hematocrit 36.0 - 46.0 % 31.0(L) 33.2(L) 33.5(L)  Platelets 150 - 400 K/uL 126(L) 111(L) 123(L)     CMP Latest Ref Rng & Units 07/17/2018 07/03/2018 06/19/2018  Glucose 70 - 99 mg/dL 169(H) 172(H) 120(H)  BUN 8 - 23 mg/dL _0 Creatinine 0.44 - 1.00 mg/dL 0.78 0.88 0.89  Sodium 135 - 145 mmol/L 141 141 140  Potassium 3.5 - 5.1 mmol/L 3.4(L) 3.2(L) 4.1  Chloride 98 - 111 mmol/L 109 115(H) 112(H)  CO2 22 - 32 mmol/L 21(L) 18(L) 21(L)  Calcium 8.9 - 10.3 mg/dL 8.4(L) 8.4(L) 8.5(L)  Total Protein 6.5 - 8.1 g/dL 6.5 6.7 6.7  Total Bilirubin 0.3 - 1.2 mg/dL 0.4 0.2(L) 0.3  Alkaline Phos 38 - 126 U/L 267(H) 61 68  AST 15 - 41 U/L 80(H) 21 29  ALT 0 - 44  U/L 118(H) 34 40      RADIOGRAPHIC STUDIES: I have personally reviewed the radiological images as listed and agreed with the findings in the report. No results found.   ASSESSMENT & PLAN:  Tammy Adams is a 67 y.o. female with    1. 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 She has progressed through first-line chemo Gem/Abraxane, second-line liposomal-irinotecan and 5FU and is currently being treated with third-line FOLFOX since 04/27/18.  -I discussed her 07/10/18 CT CAP which showed slightly larger liver lesions but some are stable. I think her current regimen is no longer adequately controlling her disease.  -I discussed after 3rd-line treatment we no longer have standard lines of chemotherapy. Her current options are  observation with palliative care alone or 4th line chemotherapy, such as GTX with  low dose Gemcitabine and Docetaxel on day 4 and 11, Xeloda 2 weeks on and 1 week off, every 21 days.  --Chemotherapy consent: Side effects including but does not not limited to, fatigue, nausea, vomiting, diarrhea, hair loss, neuropathy, fluid retention, renal and kidney dysfunction, neutropenic fever, needed for blood transfusion, bleeding, were discussed with patient in great detail. She agrees to proceed. -The goal of therapy is palliative, to prolong her life and preserve her quality of life. -Her current performance status is fair. I suggest chemo break to let her rest and recover fully and we will start GTX in 08/2018 -Labs reviewed hg at 10.5, PLTs increased to 126K, K at 3.4, LFTs elevated. Overall adequate to proceed with FOLFOX today.  -I advised her to watch for dark urine, jaundice of eyes or skin as this is evidence of worsening liver function.  -she is going to [facility] to set up with Christmas and New Year's with her daughter. -F/u on 09/02/17, to start chemo that week.    2. Diarrhea  -Related to her pancreatic malignancy and chemo  -She felt creon made her diarrhea worse, so she stopped  -She is currently on Lomotil up to 4 times a day for diarrhea.Her greatest relief is with Creon, but is too expensive for her to continue. I will contact financial aid.  -Well controlled, nearly resolved with creon   3. Anorexia and weight loss  -Continue to follow up with Dietician as needed -She has weaned off Ativan. Currently on Marinol and Mirtazapine -Appetite increased, weight stable.    4. Smoking Cessation   -She is interested in quitting smoking, we previously encouraged her. -She is currently trying to Nicoderm CQ patch, and working on quitting.   5. Goal of care discussion  -We again discussed the incurable nature of her cancer, and the overall poor prognosis, especially if she does not have good  response to chemotherapy or progress on chemo -The patient understands the goal of care is palliative. -I recommend DNR/DNI, she will think about it   6. 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 No functional limitations or pain. Will monitor closely.  -resolved now   Plan  -restaging CT reviewed, mild disease progression -Lab reviewed, adequate for treatment, will proceed chemo today with reduced 5FU 2000 mg/m2 due to her liver dysfunction  -start chemo break after today  -F/u on 09/02/18, to start gemcitabine, docetaxel and Xeloda    No problem-specific Assessment & Plan notes found for this encounter.   No orders of the defined types were placed in this encounter.  All questions were answered.  The patient knows to call the clinic with any problems, questions or concerns. No barriers to learning was detected. I spent 20 minutes counseling the patient face to face. The total time spent in the appointment was 25 minutes and more than 50% was on counseling and review of test results     Truitt Merle, MD 07/17/2018   I, Joslyn Devon, am acting as scribe for Truitt Merle, MD.   I have reviewed the above documentation for accuracy and completeness, and I agree with the above.

## 2018-07-17 ENCOUNTER — Inpatient Hospital Stay: Payer: Medicare Other

## 2018-07-17 ENCOUNTER — Inpatient Hospital Stay: Payer: Medicare Other | Admitting: Nutrition

## 2018-07-17 ENCOUNTER — Inpatient Hospital Stay (HOSPITAL_BASED_OUTPATIENT_CLINIC_OR_DEPARTMENT_OTHER): Payer: Medicare Other | Admitting: Hematology

## 2018-07-17 ENCOUNTER — Other Ambulatory Visit: Payer: Self-pay | Admitting: Hematology

## 2018-07-17 VITALS — BP 91/61 | HR 73 | Temp 97.6°F | Resp 16 | Ht 64.0 in | Wt 74.7 lb

## 2018-07-17 DIAGNOSIS — C787 Secondary malignant neoplasm of liver and intrahepatic bile duct: Secondary | ICD-10-CM

## 2018-07-17 DIAGNOSIS — C258 Malignant neoplasm of overlapping sites of pancreas: Secondary | ICD-10-CM

## 2018-07-17 DIAGNOSIS — R5383 Other fatigue: Secondary | ICD-10-CM | POA: Diagnosis not present

## 2018-07-17 DIAGNOSIS — R630 Anorexia: Secondary | ICD-10-CM | POA: Diagnosis not present

## 2018-07-17 DIAGNOSIS — Z5111 Encounter for antineoplastic chemotherapy: Secondary | ICD-10-CM | POA: Diagnosis not present

## 2018-07-17 DIAGNOSIS — Z800 Family history of malignant neoplasm of digestive organs: Secondary | ICD-10-CM

## 2018-07-17 DIAGNOSIS — R197 Diarrhea, unspecified: Secondary | ICD-10-CM

## 2018-07-17 DIAGNOSIS — C259 Malignant neoplasm of pancreas, unspecified: Secondary | ICD-10-CM

## 2018-07-17 DIAGNOSIS — R7989 Other specified abnormal findings of blood chemistry: Secondary | ICD-10-CM | POA: Diagnosis not present

## 2018-07-17 DIAGNOSIS — Z8042 Family history of malignant neoplasm of prostate: Secondary | ICD-10-CM

## 2018-07-17 DIAGNOSIS — Z79899 Other long term (current) drug therapy: Secondary | ICD-10-CM

## 2018-07-17 DIAGNOSIS — F17210 Nicotine dependence, cigarettes, uncomplicated: Secondary | ICD-10-CM

## 2018-07-17 DIAGNOSIS — I700 Atherosclerosis of aorta: Secondary | ICD-10-CM

## 2018-07-17 DIAGNOSIS — Z7189 Other specified counseling: Secondary | ICD-10-CM

## 2018-07-17 DIAGNOSIS — J439 Emphysema, unspecified: Secondary | ICD-10-CM

## 2018-07-17 DIAGNOSIS — R634 Abnormal weight loss: Secondary | ICD-10-CM

## 2018-07-17 LAB — CBC WITH DIFFERENTIAL (CANCER CENTER ONLY)
Abs Immature Granulocytes: 0.02 K/uL (ref 0.00–0.07)
Basophils Absolute: 0 K/uL (ref 0.0–0.1)
Basophils Relative: 0 %
Eosinophils Absolute: 0 K/uL (ref 0.0–0.5)
Eosinophils Relative: 0 %
HCT: 31 % — ABNORMAL LOW (ref 36.0–46.0)
Hemoglobin: 10.5 g/dL — ABNORMAL LOW (ref 12.0–15.0)
Immature Granulocytes: 0 %
Lymphocytes Relative: 12 %
Lymphs Abs: 1 K/uL (ref 0.7–4.0)
MCH: 34.9 pg — ABNORMAL HIGH (ref 26.0–34.0)
MCHC: 33.9 g/dL (ref 30.0–36.0)
MCV: 103 fL — ABNORMAL HIGH (ref 80.0–100.0)
Monocytes Absolute: 0.3 K/uL (ref 0.1–1.0)
Monocytes Relative: 4 %
Neutro Abs: 6.4 K/uL (ref 1.7–7.7)
Neutrophils Relative %: 84 %
Platelet Count: 126 K/uL — ABNORMAL LOW (ref 150–400)
RBC: 3.01 MIL/uL — ABNORMAL LOW (ref 3.87–5.11)
RDW: 15 % (ref 11.5–15.5)
WBC Count: 7.7 K/uL (ref 4.0–10.5)
nRBC: 0 % (ref 0.0–0.2)

## 2018-07-17 LAB — CMP (CANCER CENTER ONLY)
ALT: 118 U/L — ABNORMAL HIGH (ref 0–44)
AST: 80 U/L — ABNORMAL HIGH (ref 15–41)
Albumin: 3.3 g/dL — ABNORMAL LOW (ref 3.5–5.0)
Alkaline Phosphatase: 267 U/L — ABNORMAL HIGH (ref 38–126)
Anion gap: 11 (ref 5–15)
BUN: 15 mg/dL (ref 8–23)
CO2: 21 mmol/L — ABNORMAL LOW (ref 22–32)
Calcium: 8.4 mg/dL — ABNORMAL LOW (ref 8.9–10.3)
Chloride: 109 mmol/L (ref 98–111)
Creatinine: 0.78 mg/dL (ref 0.44–1.00)
GFR, Est AFR Am: >60 mL/min
GFR, Estimated: >60 mL/min
Glucose, Bld: 169 mg/dL — ABNORMAL HIGH (ref 70–99)
Potassium: 3.4 mmol/L — ABNORMAL LOW (ref 3.5–5.1)
Sodium: 141 mmol/L (ref 135–145)
Total Bilirubin: 0.4 mg/dL (ref 0.3–1.2)
Total Protein: 6.5 g/dL (ref 6.5–8.1)

## 2018-07-17 MED ORDER — DEXAMETHASONE SODIUM PHOSPHATE 10 MG/ML IJ SOLN
10.0000 mg | Freq: Once | INTRAMUSCULAR | Status: AC
  Administered 2018-07-17: 10 mg via INTRAVENOUS

## 2018-07-17 MED ORDER — SODIUM CHLORIDE 0.9 % IV SOLN
2200.0000 mg/m2 | INTRAVENOUS | Status: DC
  Filled 2018-07-17: qty 58

## 2018-07-17 MED ORDER — PALONOSETRON HCL INJECTION 0.25 MG/5ML
INTRAVENOUS | Status: AC
  Filled 2018-07-17: qty 5

## 2018-07-17 MED ORDER — DEXAMETHASONE SODIUM PHOSPHATE 10 MG/ML IJ SOLN
INTRAMUSCULAR | Status: AC
  Filled 2018-07-17: qty 1

## 2018-07-17 MED ORDER — LEUCOVORIN CALCIUM INJECTION 350 MG
400.0000 mg/m2 | Freq: Once | INTRAVENOUS | Status: AC
  Administered 2018-07-17: 528 mg via INTRAVENOUS
  Filled 2018-07-17: qty 26.4

## 2018-07-17 MED ORDER — SODIUM CHLORIDE 0.9% FLUSH
10.0000 mL | Freq: Once | INTRAVENOUS | Status: AC
  Administered 2018-07-17: 10 mL
  Filled 2018-07-17: qty 10

## 2018-07-17 MED ORDER — SODIUM CHLORIDE 0.9 % IV SOLN
2000.0000 mg/m2 | INTRAVENOUS | Status: DC
  Administered 2018-07-17: 2500 mg via INTRAVENOUS
  Filled 2018-07-17: qty 50

## 2018-07-17 MED ORDER — DEXTROSE 5 % IV SOLN
Freq: Once | INTRAVENOUS | Status: AC
  Administered 2018-07-17: 11:00:00 via INTRAVENOUS
  Filled 2018-07-17: qty 250

## 2018-07-17 MED ORDER — OXALIPLATIN CHEMO INJECTION 100 MG/20ML
70.0000 mg/m2 | Freq: Once | INTRAVENOUS | Status: AC
  Administered 2018-07-17: 90 mg via INTRAVENOUS
  Filled 2018-07-17: qty 18

## 2018-07-17 MED ORDER — PALONOSETRON HCL INJECTION 0.25 MG/5ML
0.2500 mg | Freq: Once | INTRAVENOUS | Status: AC
  Administered 2018-07-17: 0.25 mg via INTRAVENOUS

## 2018-07-17 MED ORDER — DEXTROSE 5 % IV SOLN
Freq: Once | INTRAVENOUS | Status: AC
  Administered 2018-07-17: 12:00:00 via INTRAVENOUS
  Filled 2018-07-17: qty 250

## 2018-07-17 NOTE — Patient Instructions (Signed)
[facility] Discharge Instructions for Patients Receiving Chemotherapy  Today you received the following chemotherapy agents: Oxaliplatin, Leucovorin, and 5FU.  To help prevent nausea and vomiting after your treatment, we encourage you to take your nausea medication as directed.   If you develop nausea and vomiting that is not controlled by your nausea medication, call the clinic.   BELOW ARE SYMPTOMS THAT SHOULD BE REPORTED IMMEDIATELY:  *FEVER GREATER THAN 100.5 F  *CHILLS WITH OR WITHOUT FEVER  NAUSEA AND VOMITING THAT IS NOT CONTROLLED WITH YOUR NAUSEA MEDICATION  *UNUSUAL SHORTNESS OF BREATH  *UNUSUAL BRUISING OR BLEEDING  TENDERNESS IN MOUTH AND THROAT WITH OR WITHOUT PRESENCE OF ULCERS  *URINARY PROBLEMS  *BOWEL PROBLEMS  UNUSUAL RASH Items with * indicate a potential emergency and should be followed up as soon as possible.  Feel free to call the clinic should you have any questions or concerns. The clinic phone number is [phone].  Please show the CHEMO ALERT CARD at check-in to the Emergency Department and triage nurse.    

## 2018-07-17 NOTE — Patient Instructions (Signed)
Implanted Port Home Guide An implanted port is a type of central line that is placed under the skin. Central lines are used to provide IV access when treatment or nutrition needs to be given through a person's veins. Implanted ports are used for long-term IV access. An implanted port may be placed because:  You need IV medicine that would be irritating to the small veins in your hands or arms.  You need long-term IV medicines, such as antibiotics.  You need IV nutrition for a long period.  You need frequent blood draws for lab tests.  You need dialysis.  Implanted ports are usually placed in the chest area, but they can also be placed in the upper arm, the abdomen, or the leg. An implanted port has two main parts:  Reservoir. The reservoir is round and will appear as a small, raised area under your skin. The reservoir is the part where a needle is inserted to give medicines or draw blood.  Catheter. The catheter is a thin, flexible tube that extends from the reservoir. The catheter is placed into a large vein. Medicine that is inserted into the reservoir goes into the catheter and then into the vein.  How will I care for my incision site? Do not get the incision site wet. Bathe or shower as directed by your health care provider. How is my port accessed? Special steps must be taken to access the port:  Before the port is accessed, a numbing cream can be placed on the skin. This helps numb the skin over the port site.  Your health care provider uses a sterile technique to access the port. ? Your health care provider must put on a mask and sterile gloves. ? The skin over your port is cleaned carefully with an antiseptic and allowed to dry. ? The port is gently pinched between sterile gloves, and a needle is inserted into the port.  Only "non-coring" port needles should be used to access the port. Once the port is accessed, a blood return should be checked. This helps ensure that the port  is in the vein and is not clogged.  If your port needs to remain accessed for a constant infusion, a clear (transparent) bandage will be placed over the needle site. The bandage and needle will need to be changed every week, or as directed by your health care provider.  Keep the bandage covering the needle clean and dry. Do not get it wet. Follow your health care provider's instructions on how to take a shower or bath while the port is accessed.  If your port does not need to stay accessed, no bandage is needed over the port.  What is flushing? Flushing helps keep the port from getting clogged. Follow your health care provider's instructions on how and when to flush the port. Ports are usually flushed with saline solution or a medicine called heparin. The need for flushing will depend on how the port is used.  If the port is used for intermittent medicines or blood draws, the port will need to be flushed: ? After medicines have been given. ? After blood has been drawn. ? As part of routine maintenance.  If a constant infusion is running, the port may not need to be flushed.  How long will my port stay implanted? The port can stay in for as long as your health care provider thinks it is needed. When it is time for the port to come out, surgery will be   done to remove it. The procedure is similar to the one performed when the port was put in. When should I seek immediate medical care? When you have an implanted port, you should seek immediate medical care if:  You notice a bad smell coming from the incision site.  You have swelling, redness, or drainage at the incision site.  You have more swelling or pain at the port site or the surrounding area.  You have a fever that is not controlled with medicine.  This information is not intended to replace advice given to you by your health care provider. Make sure you discuss any questions you have with your health care provider. Document  Released: 08/07/2005 Document Revised: 01/13/2016 Document Reviewed: 04/14/2013 Elsevier Interactive Patient Education  2017 Elsevier Inc.  

## 2018-07-17 NOTE — Progress Notes (Signed)
Nutrition follow-up completed with patient being treated for metastatic pancreas cancer. Weight decreased and documented as 74.7 pounds November 27 down from 77 pounds on October 30. Patient reports she continues to feel well and she feels like she is eating better. Patient denies diarrhea but said her stools are soft. She is drinking 2-3 boost plus daily.  Her daughter continues to ship these to her home for her. Patient denies nausea and vomiting.  Nutrition diagnosis: Unintended weight loss continues.  Intervention: Patient was educated to increase oral nutrition supplements 3 times daily between meals. Encourage patient to consume calorie and protein rich foods. Teach back method used.  Monitoring, evaluation, goals: Patient will tolerate increased calories and protein to minimize further weight loss.  Next visit: To be scheduled as needed.  **Disclaimer: This note was dictated with voice recognition software. Similar sounding words can inadvertently be transcribed and this note may contain transcription errors which may not have been corrected upon publication of note.**

## 2018-07-17 NOTE — Progress Notes (Signed)
OK to treat with labs today per MD Burr Medico

## 2018-07-18 ENCOUNTER — Encounter: Payer: Self-pay | Admitting: Hematology

## 2018-07-18 NOTE — Progress Notes (Signed)
DISCONTINUE ON PATHWAY REGIMEN - Pancreatic     A cycle is every 14 days:     Liposomal irinotecan      Leucovorin      5-Fluorouracil   **Always confirm dose/schedule in your pharmacy ordering system**  REASON: Disease Progression PRIOR TREATMENT: PANOS74: Liposomal Irinotecan 70 mg/m2 + 5-Fluorouracil 2400 mg/m2 CIV + Leucovorin 400 mg/m2 q14 Days TREATMENT RESPONSE: Partial Response (PR)  START OFF PATHWAY REGIMEN - Pancreatic   [id number]:Docetaxel 75 mg/m2 D8 + Gemcitabine 900 mg/m2 D1,8 q21 Days:   A cycle is every 21 days:     Gemcitabine      Docetaxel      [id number]   **Always confirm dose/schedule in your pharmacy ordering system**  Patient Characteristics: Adenocarcinoma, Metastatic Disease, Third Line and Beyond, MSS/pMMR or MSI Unknown Histology: Adenocarcinoma Current evidence of distant metastases<= Yes AJCC T Category: TX AJCC N Category: NX AJCC M Category: M1 AJCC 8 Stage Grouping: IV Line of Therapy: Third Engineer, civil (consulting) Status: MSS/pMMR Intent of Therapy: Non-Curative / Palliative Intent, Discussed with Patient

## 2018-07-19 ENCOUNTER — Inpatient Hospital Stay: Payer: Medicare Other

## 2018-07-19 DIAGNOSIS — R630 Anorexia: Secondary | ICD-10-CM | POA: Diagnosis not present

## 2018-07-19 DIAGNOSIS — C787 Secondary malignant neoplasm of liver and intrahepatic bile duct: Principal | ICD-10-CM

## 2018-07-19 DIAGNOSIS — C258 Malignant neoplasm of overlapping sites of pancreas: Secondary | ICD-10-CM | POA: Diagnosis not present

## 2018-07-19 DIAGNOSIS — R634 Abnormal weight loss: Secondary | ICD-10-CM | POA: Diagnosis not present

## 2018-07-19 DIAGNOSIS — R7989 Other specified abnormal findings of blood chemistry: Secondary | ICD-10-CM | POA: Diagnosis not present

## 2018-07-19 DIAGNOSIS — C259 Malignant neoplasm of pancreas, unspecified: Secondary | ICD-10-CM

## 2018-07-19 DIAGNOSIS — Z5111 Encounter for antineoplastic chemotherapy: Secondary | ICD-10-CM | POA: Diagnosis not present

## 2018-07-19 MED ORDER — HEPARIN SOD (PORK) LOCK FLUSH 100 UNIT/ML IV SOLN
500.0000 [IU] | Freq: Once | INTRAVENOUS | Status: AC
  Administered 2018-07-19: 500 [IU]
  Filled 2018-07-19: qty 5

## 2018-07-19 MED ORDER — SODIUM CHLORIDE 0.9% FLUSH
10.0000 mL | Freq: Once | INTRAVENOUS | Status: AC
  Administered 2018-07-19: 10 mL
  Filled 2018-07-19: qty 10

## 2018-07-19 NOTE — Patient Instructions (Signed)

## 2018-07-21 ENCOUNTER — Other Ambulatory Visit: Payer: Self-pay | Admitting: Hematology

## 2018-07-22 ENCOUNTER — Other Ambulatory Visit: Payer: Self-pay | Admitting: Hematology

## 2018-07-22 ENCOUNTER — Telehealth: Payer: Self-pay

## 2018-07-22 ENCOUNTER — Encounter: Payer: Self-pay | Admitting: Hematology

## 2018-07-22 MED ORDER — CAPECITABINE 500 MG PO TABS: 850.0000 mg/m2 | ORAL_TABLET | Freq: Two times a day (BID) | ORAL | 2 refills | Status: DC

## 2018-07-22 NOTE — Telephone Encounter (Signed)
Prescription for Xeloda placed in Oral Chemo folder.

## 2018-07-22 NOTE — Progress Notes (Signed)
Rcvd staff msg from Cira Rue regarding copay assistance for Creon.  After researching, [employer] may have assistance for pt's w/ Medicare ins for Creon.  I completed the drs portion and got Dr. Ernestina Penna signature.  Pt requested I mail the application to her and she will mail it to [place] for processing.  Putting in the mail today.

## 2018-07-23 ENCOUNTER — Telehealth: Payer: Self-pay | Admitting: Pharmacist

## 2018-07-23 DIAGNOSIS — E43 Unspecified severe protein-calorie malnutrition: Secondary | ICD-10-CM | POA: Diagnosis not present

## 2018-07-23 DIAGNOSIS — C259 Malignant neoplasm of pancreas, unspecified: Secondary | ICD-10-CM

## 2018-07-23 DIAGNOSIS — F418 Other specified anxiety disorders: Secondary | ICD-10-CM | POA: Diagnosis not present

## 2018-07-23 DIAGNOSIS — C787 Secondary malignant neoplasm of liver and intrahepatic bile duct: Secondary | ICD-10-CM | POA: Diagnosis not present

## 2018-07-23 NOTE — Telephone Encounter (Signed)
Oral Oncology Pharmacist Encounter  Received new prescription for Xeloda (capecitabine) for the treatment of stage IV pancreatic cancer in conjunction with docetaxel and gemcitabine, planned duration until disease progression or unacceptable toxicity.  Patient has previously been treated with Gemzar plus Abraxane from 07/25/17 - 01/22/18, discontinued due to disease progression. Patient then treated with Onyvide and 5-fluorouracil 02/05/18-04/02/18, discontinued due to disease progression. Patient then transitioned to FOLFOX chemotherapy 04/27/18-07/17/18, discontinued due to slight disease progression.  Plan to change treatment now to 4th line docetaxel, gemcitabine, and capecitabine to start on 09/02/2018  Xeloda (capcitabine) will be given at ~600 mg/m2 by mouth twice daily on days 1-14 of each 21 day cycle Gemcitabine will administered at 750 mg/m2 IV given over 67min on days 4 and 11 of each 21 day cycle Docetaxel will administered at 30 mg/m2 IV given over 24min on days 4 and 11 of each 21 day cycle  Labs from 07/17/18 assessed, OK for treatment. Noted pltc= 126k, will be closely monitored during treatment  Current medication list in Epic reviewed, no DDIs with Xeloda identified.  Prescription has been e-scribed to the [facility] for benefits analysis and approval, once dose is clarified.  Oral Oncology Clinic will continue to follow for insurance authorization, copayment issues, initial counseling and start date.  Johny Drilling, PharmD, BCPS, BCOP  Started on: 07/23/2018 8:14 AM  completed on: 07/29/2018  4:01 PM  Oral Oncology Clinic [phone]

## 2018-07-29 MED ORDER — CAPECITABINE 500 MG PO TABS: ORAL_TABLET | ORAL | 2 refills | Status: AC

## 2018-08-05 NOTE — Telephone Encounter (Signed)
Oral Chemotherapy Pharmacist Encounter   I spoke with patient for overview of: Xeloda (capecitabine) for the treatment of stage IV pancreatic cancer in conjunction with docetaxel and gemcitabine (GTX regimen), planned duration until disease progression or unacceptable toxicity.   Counseled patient on administration, dosing, side effects, monitoring, drug-food interactions, safe handling, storage, and disposal.  Patient will take Xeloda 500mg  tablets, 2 tablets (1000mg ) by mouth in AM and 1 tablet (500mg ) by mouth in PM, within 30 minutes of finishing meals, on days 1-14 of each 21 day cycle.   Gemcitabine will administered at 750 mg/m2 IV given over 21min on days 4 and 11 of each 21 day cycle Docetaxel will administered at 30 mg/m2 IV given over 38min on days 4 and 11 of each 21 day cycle  Xeloda start date: 09/02/2018 Gemcitabine and docetaxel start date: 09/06/2018  Adverse effects include but are not limited to: fatigue, decreased blood counts, GI upset, diarrhea, mouth sores, and hand-foot syndrome.  Patient states her diarrhea is finally starting to subside from previous chemotherapy. He appetite is getting better every day as well.  Patient has anti-emetic on hand and knows to take it if nausea develops.   Patient will obtain anti diarrheal and alert the office of 4 or more loose stools above baseline.  Reviewed with patient importance of keeping a medication schedule and plan for any missed doses.  Ms. Inoue voiced understanding and appreciation.   All questions answered. Medication reconciliation performed and medication/allergy list updated.  Xeloda will ship from the [facility] on Thursday 08/15/18 for delivery to her home on Friday 12/27.  Patient is planning to travel to [place] on 08/18/18 and is unsure if she will be back prior to 08/31/18. This way she has her Xeloda in hand in order to start after breakfast on 09/02/18.  Copayment for 1st fill of  Xeloda ~$40, fully covered by her Medicare supplement so that the out of pocket expense to patient is $0  Patient knows to call the office with questions or concerns. Oral Oncology Clinic will continue to follow.  Johny Drilling, PharmD, BCPS, BCOP  08/05/2018   10:51 AM Oral Oncology Clinic [phone]

## 2018-08-15 ENCOUNTER — Other Ambulatory Visit: Payer: Self-pay | Admitting: Nurse Practitioner

## 2018-08-15 ENCOUNTER — Other Ambulatory Visit: Payer: Self-pay | Admitting: Hematology

## 2018-08-15 DIAGNOSIS — C787 Secondary malignant neoplasm of liver and intrahepatic bile duct: Principal | ICD-10-CM

## 2018-08-15 DIAGNOSIS — C259 Malignant neoplasm of pancreas, unspecified: Secondary | ICD-10-CM

## 2018-08-15 MED ORDER — DRONABINOL 2.5 MG PO CAPS: ORAL_CAPSULE | ORAL | 0 refills | Status: AC

## 2018-08-15 MED ORDER — DIPHENOXYLATE-ATROPINE 2.5-0.025 MG PO TABS: 1.0000 | ORAL_TABLET | Freq: Four times a day (QID) | ORAL | 2 refills | Status: AC | PRN

## 2018-08-15 MED FILL — CAPECITABINE 500 MG TABS: 500 | 21 days supply | Qty: 42 | Fill #0

## 2018-08-15 NOTE — Telephone Encounter (Signed)
Oral Oncology Patient Advocate Encounter  Confirmed with [place] that Xeloda was shipped on 12/26 to be delivered 12/27. $0 copay   [place] Patient [place] Phone [phone] Fax [phone]

## 2018-08-30 NOTE — Progress Notes (Signed)
[place]   [phone] [phone]   Clinic Follow up Note   Patient Care Team: Wilford Corner, MD as PCP - General (Gastroenterology) Truitt Merle, MD as Consulting Physician (Hematology)  Date of Service:  09/02/2018  CHIEF COMPLAINT: F/u of metastatic pancreatic cancer  SUMMARY OF ONCOLOGIC HISTORY: Oncology History   Cancer Staging Pancreatic cancer metastasized to liver [facility] Staging form: Exocrine Pancreas, AJCC 8th Edition - Clinical stage from 07/06/2017: Stage IV (cTX, cNX, pM1) - Signed by Truitt Merle, MD on 07/23/2017       Pancreatic cancer metastasized to liver ([place])   06/21/2017 Imaging    CT CAP IMPRESSION: 1. Indistinct low-attenuation mass in the uncinate process of the pancreas with dilatation of remainder of the pancreatic duct and some encasement of the SMA. These findings are highly indicative of pancreatic carcinoma. Recommend MRI to assess further. 2. Multiple low-attenuation lesions throughout the right and left lobes of liver most consistent with liver metastasis again MRI may be helpful. 3. Moderate change of abdominal aortic atherosclerosis. 4. Changes of centrilobular and paraseptal emphysema on CT of the chest. No lung lesion is seen.     07/06/2017 Initial Biopsy    Diagnosis Liver, needle/core biopsy, Right lobe - METASTATIC ADENOCARCINOMA.    07/10/2017 Initial Diagnosis    Pancreatic cancer metastasized to liver ([place])    07/25/2017 - 01/22/2018 Chemotherapy    Gemcitabine and Abraxane 3 weeks on and 1 week off starting 07/25/17 and stopped 01/22/18 due to disease progression.    10/02/2017 Imaging    CT CAP W Contrast 10/02/17 IMPRESSION: 1. Reduced size of the primary pancreatic neoplasm, which still continues to encase the superior mesenteric artery and occlude the superior mesenteric vein. The numerous metastatic liver lesions show some improvement on portal venous phase images, with increase in  the central necrosis and slight reduction in size. However, the arterial phase images, which are present today but were not available on the prior exam, demonstrate that there is a significant peripheral rind around each of these lesions and that the portal venous phase images underestimate total lesions size. No findings of metastatic disease to the chest or skeleton. 2. Severe and worsened cachexia. 3. Low-grade mesenteric edema and a small amount of pelvic ascites. 4. Aortic Atherosclerosis (ICD10-I70.0) and Emphysema (ICD10-J43.9). 5.  Prominent stool throughout the colon favors constipation.    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  The Hereditary Gene Panel offered by [facility] includes sequencing and/or deletion duplication testing of the following 47 genes: APC, ATM, AXIN2, BARD1, BMPR1A, BRCA1, BRCA2, BRIP1, CDH1, CDK4, CDKN2A (p14ARF), CDKN2A (p16INK4a), CHEK2, CTNNA1, DICER1, EPCAM (Deletion/duplication testing only), GREM1 (promoter region deletion/duplication testing only), KIT, MEN1, MLH1, MSH2, MSH3, MSH6, MUTYH, NBN, NF1, NHTL1, PALB2, PDGFRA, PMS2, POLD1, POLE, PTEN, RAD50, RAD51C, RAD51D, SDHB, SDHC, SDHD, SMAD4, SMARCA4. STK11, TP53, TSC1, TSC2, and VHL.  The following genes were evaluated for sequence changes only: SDHA and HOXB13 c.251G>A variant only. The report date is November 27, 2017.     01/08/2018 Imaging    CT CAP W Contrast 01/08/18  IMPRESSION: 1. Overall, there appears to be slight progression of disease, as evidenced by slight growth of the primary pancreatic lesion. While some of the previously noted hepatic lesions appear to have resolved or gotten smaller, others appear to be new or are slightly larger than the prior examination, as detailed above. 2. Several unusual appearing  pulmonary nodules scattered throughout the lungs bilaterally, some of which are solid  while others are more subsolid in appearance. Although these could be infectious or inflammatory in etiology, the possibility of developing metastatic disease in the lungs should be considered, and close attention on follow-up studies is recommended. 3. Aortic atherosclerosis. 4. Additional incidental findings, as above. Aortic Atherosclerosis (ICD10-I70.0).     02/04/2018 - 04/02/2018 Chemotherapy    Second line 5-fu pump and liposomal irinotecan every 2 weeks started on 02/05/2018 and due to disease progression, stopped on 04/02/18    04/12/2018 Imaging    IMPRESSION: 1. Interval increase in size and development of multiple new low-attenuation lesions throughout the liver 2. Slight interval increase in size of primary pancreatic lesion. 3. Previously described pulmonary nodules are decreased in size or resolved when compared to prior exam, potentially resolving infectious/inflammatory process. 4. Interval increase in size of sclerotic lesion within the L2 vertebral body, potentially representing osseous metastatic disease.    04/27/2018 - 07/17/2018 Chemotherapy    3rd line FOLFOX q2 weeks starting 04/27/18. Dose reduced from cycle 2. Stopped after 07/17/18 due to slight disease progression    07/10/2018 Imaging    CT CAP W Contrast 07/10/18 IMPRESSION: 1. Again seen are multifocal liver metastases. Liver lesions are stable or minimally increased in size in the interval. 2. Interval decrease in size of primary pancreatic lesion. 3. Moderate to marked intrahepatic and proximal common bile duct dilatation. There is a new cystic mass within the head of pancreas which appears to have mass effect upon the distal CBD. Given the advanced changes of chronic pancreatitis this may represent a new pseudo cysts. 4. 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 The portal vein and splenic veins remain patent. 5. Stable  to mild increase in size of L2 sclerotic lesion.      CURRENT THERAPY:  Observation  INTERVAL HISTORY:  Tammy Adams is here for a follow up of pancreatic cancer. She presents to the clinic today by herself. She notes with chemo break she feels well but weaker. She was not able to enjoy the holidays like she use to due to disease. She still able to take care of herself but will take naps as needed. She has been able to gain some weight and notes adequate appointe. She denies abdominal pain. She has a bowel movement after each meal. She continues to take Creon which has helped.      REVIEW OF SYSTEMS:   Constitutional: Denies fevers, chills (+) weight gain  Eyes: Denies blurriness of vision Ears, nose, mouth, throat, and face: Denies mucositis or sore throat Respiratory: Denies cough, dyspnea or wheezes Cardiovascular: Denies palpitation, chest discomfort or lower extremity swelling Gastrointestinal:  Denies nausea, heartburn or change in bowel habits Skin: Denies abnormal skin rashes Lymphatics: Denies new lymphadenopathy or easy bruising Neurological:Denies numbness, tingling or new weaknesses Behavioral/Psych: Mood is stable, no new changes  All other systems were reviewed with the patient and are negative.  MEDICAL HISTORY:  Past Medical History:  Diagnosis Date  . Anxiety   . Family history of colon cancer   . Family history of pancreatic cancer   . Family history of prostate cancer   . pancreatic ca dx'd 06/2017    SURGICAL HISTORY: Past Surgical History:  Procedure Laterality Date  . IR FLUORO GUIDE PORT INSERTION RIGHT  07/24/2017  . IR US GUIDE VASC ACCESS RIGHT  07/24/2017  I have reviewed the social history and family history with the patient and they are unchanged from previous note.  ALLERGIES:  has No Known Allergies.  MEDICATIONS:  Current Outpatient Medications  Medication Sig Dispense Refill  . capecitabine (XELODA) 500 MG tablet Take 2 tablets  (1062m) by mouth in AM & 1 tab (5031m in PM, immediately after food. Take for 14 days on, 7 days off, repeated every 21 d 42 tablet 2  . diphenoxylate-atropine (LOMOTIL) 2.5-0.025 MG tablet Take 1-2 tablets by mouth 4 (four) times daily as needed for diarrhea or loose stools. 30 tablet 2  . dronabinol (MARINOL) 2.5 MG capsule TAKE 1 CAPSULE BY MOUTH TWICE DAILY BEFORE MEAL(S) 60 capsule 0  . LORazepam (ATIVAN) 1 MG tablet Take 1 mg 2 (two) times daily by mouth.    . mirtazapine (REMERON) 15 MG tablet TAKE 1 TABLET BY MOUTH AT BEDTIME 30 tablet 0  . Multiple Vitamin (MULTIVITAMIN WITH MINERALS) TABS tablet Take 1 tablet daily by mouth. One-A-Day for Women    . Pancrelipase, Lip-Prot-Amyl, (CREON) 24000-76000 units CPEP Take 1 capsule (24,000 Units total) by mouth 3 (three) times daily before meals. 180 capsule 2  . potassium chloride SA (K-DUR,KLOR-CON) 20 MEQ tablet Take 1 tablet (20 mEq total) by mouth daily. 40 tablet 1   No current facility-administered medications for this visit.     PHYSICAL EXAMINATION: ECOG PERFORMANCE STATUS: 1 - Symptomatic but completely ambulatory  Vitals:   09/02/18 0925  BP: 109/70  Pulse: 72  Resp: 17  Temp: 98.1 F (36.7 C)  SpO2: 100%   Filed Weights   09/02/18 0925  Weight: 76 lb 1.6 oz (34.5 kg)    GENERAL:alert, no distress and comfortable SKIN: skin color, texture, turgor are normal, no rashes or significant lesions EYES: normal, Conjunctiva are pink and non-injected, sclera clear OROPHARYNX:no exudate, no erythema and lips, buccal mucosa, and tongue normal  NECK: supple, thyroid normal size, non-tender, without nodularity LYMPH:  no palpable lymphadenopathy in the cervical, axillary or inguinal LUNGS: clear to auscultation and percussion with normal breathing effort HEART: regular rate & rhythm and no murmurs and no lower extremity edema ABDOMEN:abdomen soft, non-tender and normal bowel sounds Musculoskeletal:no cyanosis of digits and no  clubbing  NEURO: alert & oriented x 3 with fluent speech, no focal motor/sensory deficits  LABORATORY DATA:  I have reviewed the data as listed CBC Latest Ref Rng & Units 09/02/2018 07/17/2018 07/03/2018  WBC 4.0 - 10.5 K/uL 9.3 7.7 5.7  Hemoglobin 12.0 - 15.0 g/dL 9.6(L) 10.5(L) 11.0(L)  Hematocrit 36.0 - 46.0 % 27.8(L) 31.0(L) 33.2(L)  Platelets 150 - 400 K/uL 187 126(L) 111(L)     CMP Latest Ref Rng & Units 09/02/2018 07/17/2018 07/03/2018  Glucose 70 - 99 mg/dL 76 169(H) 172(H)  BUN 8 - 23 mg/dL _0 Creatinine 0.44 - 1.00 mg/dL 0.65 0.78 0.88  Sodium 135 - 145 mmol/L 139 141 141  Potassium 3.5 - 5.1 mmol/L 4.2 3.4(L) 3.2(L)  Chloride 98 - 111 mmol/L 109 109 115(H)  CO2 22 - 32 mmol/L 24 21(L) 18(L)  Calcium 8.9 - 10.3 mg/dL 8.4(L) 8.4(L) 8.4(L)  Total Protein 6.5 - 8.1 g/dL 6.5 6.5 6.7  Total Bilirubin 0.3 - 1.2 mg/dL 6.6(HH) 0.4 0.2(L)  Alkaline Phos 38 - 126 U/L 1,072(H) 267(H) 61  AST 15 - 41 U/L 85(H) 80(H) 21  ALT 0 - 44 U/L 72(H) 118(H) 34      RADIOGRAPHIC STUDIES: I have personally reviewed the  radiological images as listed and agreed with the findings in the report. No results found.   ASSESSMENT & PLAN:  Tammy Adams is a 68 y.o. female with   1. 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 She has progressed through first-line chemo Gem/Abraxane, second-line liposomal-irinotecan and 5FU and third-line FOLFOX.  -I discussed after 3rd-line treatment we no longer have standard lines of chemotherapy. Her current options are observation with palliative care alone or 4th line chemotherapy, such as GTX with  low dose Gemcitabine and Docetaxel on day 4 and 11, Xeloda 2 weeks on and 1 week off, every 21 days.  -I again discussed her option of palliative care without treatment or proceed with 4th line chemo. Per pt request I will discussed this with her brother Dr. Coral Spikes over the phone.    -After a lengthy discussion, she would like to proceed with observation for now and will reconsider chemo later. As she is not on active treatment now, she is eligible for hospice home care, and I encouraged her to have hospice.  We also discussed goal of care.  She will think about it and discuss this with her family.  -I discussed watching for signs of disease progression such as hair loss, significant abdominal pain and weight loss. If she is concerned she should contact me.  -Unfortunately within the significant worsening liver function, I anticipate her life expectancy would be a few weeks to a few months  -F/u in 4 weeks, pt does not want to be seen sooner, she knows to call me if she develops new symptoms    2. Jaundice  -She has developed a jaundiced on the exam, and the bilirubin was 6.6 today, normal on last visit. -I am almost certain this is related to her underlying metastatic pancreatic cancer -Get a abdominal ultrasound in the next few days, to see if she is a candidate for stent placement  3. Anorexia and weight loss  -Continue to follow up with Dietician as needed -She has weaned off Ativan. Currently on Marinol and Mirtazapine -Appetite and weight continues to improve   4. Smoking Cessation  -She is currently trying to Nicoderm CQ patch, and working on quitting. I encouraged her to continue    5. Goal of care discussion  -We again discussed the incurable nature of her cancer, and the overall poor prognosis, especially she has further cancer progression now after multiple line of chemo --The patient understands the goal of care is palliative. -I recommend DNR/DNI, she will think about it, I gave her the paper of DNR today    Plan -Will proceed with observation and supportive care for now, she will think about hospice  -Lab, flush and f/u in 4 weeks -I will call her brother    No problem-specific Assessment & Plan notes found for this encounter.   No  orders of the defined types were placed in this encounter.  All questions were answered. The patient knows to call the clinic with any problems, questions or concerns. No barriers to learning was detected. I spent 20 minutes counseling the patient face to face. The total time spent in the appointment was 25 minutes and more than 50% was on counseling and review of test results     Truitt Merle, MD 09/02/2018   I, Joslyn Devon, am acting as scribe for Truitt Merle, MD.   I have reviewed the above documentation for accuracy and completeness, and I  agree with the above.

## 2018-09-02 ENCOUNTER — Inpatient Hospital Stay: Payer: Medicare Other | Attending: Hematology

## 2018-09-02 ENCOUNTER — Inpatient Hospital Stay: Payer: Medicare Other

## 2018-09-02 ENCOUNTER — Telehealth: Payer: Self-pay

## 2018-09-02 ENCOUNTER — Inpatient Hospital Stay (HOSPITAL_BASED_OUTPATIENT_CLINIC_OR_DEPARTMENT_OTHER): Payer: Medicare Other | Admitting: Hematology

## 2018-09-02 ENCOUNTER — Telehealth: Payer: Self-pay | Admitting: Hematology

## 2018-09-02 ENCOUNTER — Encounter: Payer: Self-pay | Admitting: Hematology

## 2018-09-02 VITALS — BP 109/70 | HR 72 | Temp 98.1°F | Resp 17 | Ht 64.0 in | Wt 76.1 lb

## 2018-09-02 DIAGNOSIS — C259 Malignant neoplasm of pancreas, unspecified: Secondary | ICD-10-CM | POA: Diagnosis not present

## 2018-09-02 DIAGNOSIS — Z9221 Personal history of antineoplastic chemotherapy: Secondary | ICD-10-CM | POA: Diagnosis not present

## 2018-09-02 DIAGNOSIS — R630 Anorexia: Secondary | ICD-10-CM

## 2018-09-02 DIAGNOSIS — Z79899 Other long term (current) drug therapy: Secondary | ICD-10-CM | POA: Diagnosis not present

## 2018-09-02 DIAGNOSIS — J439 Emphysema, unspecified: Secondary | ICD-10-CM | POA: Insufficient documentation

## 2018-09-02 DIAGNOSIS — I700 Atherosclerosis of aorta: Secondary | ICD-10-CM | POA: Insufficient documentation

## 2018-09-02 DIAGNOSIS — F17210 Nicotine dependence, cigarettes, uncomplicated: Secondary | ICD-10-CM | POA: Insufficient documentation

## 2018-09-02 DIAGNOSIS — R42 Dizziness and giddiness: Secondary | ICD-10-CM | POA: Insufficient documentation

## 2018-09-02 DIAGNOSIS — C787 Secondary malignant neoplasm of liver and intrahepatic bile duct: Principal | ICD-10-CM

## 2018-09-02 DIAGNOSIS — Z800 Family history of malignant neoplasm of digestive organs: Secondary | ICD-10-CM | POA: Diagnosis not present

## 2018-09-02 DIAGNOSIS — K831 Obstruction of bile duct: Secondary | ICD-10-CM | POA: Insufficient documentation

## 2018-09-02 DIAGNOSIS — F419 Anxiety disorder, unspecified: Secondary | ICD-10-CM | POA: Diagnosis not present

## 2018-09-02 DIAGNOSIS — I2510 Atherosclerotic heart disease of native coronary artery without angina pectoris: Secondary | ICD-10-CM

## 2018-09-02 DIAGNOSIS — Z452 Encounter for adjustment and management of vascular access device: Secondary | ICD-10-CM | POA: Insufficient documentation

## 2018-09-02 LAB — CBC WITH DIFFERENTIAL (CANCER CENTER ONLY)
Abs Immature Granulocytes: 0.04 10*3/uL (ref 0.00–0.07)
Basophils Absolute: 0 10*3/uL (ref 0.0–0.1)
Basophils Relative: 0 %
Eosinophils Absolute: 0 10*3/uL (ref 0.0–0.5)
Eosinophils Relative: 0 %
HCT: 27.8 % — ABNORMAL LOW (ref 36.0–46.0)
Hemoglobin: 9.6 g/dL — ABNORMAL LOW (ref 12.0–15.0)
Immature Granulocytes: 0 %
Lymphocytes Relative: 10 %
Lymphs Abs: 1 10*3/uL (ref 0.7–4.0)
MCH: 35.6 pg — ABNORMAL HIGH (ref 26.0–34.0)
MCHC: 34.5 g/dL (ref 30.0–36.0)
MCV: 103 fL — ABNORMAL HIGH (ref 80.0–100.0)
MONO ABS: 0.4 10*3/uL (ref 0.1–1.0)
Monocytes Relative: 5 %
Neutro Abs: 7.9 10*3/uL — ABNORMAL HIGH (ref 1.7–7.7)
Neutrophils Relative %: 85 %
Platelet Count: 187 10*3/uL (ref 150–400)
RBC: 2.7 MIL/uL — ABNORMAL LOW (ref 3.87–5.11)
RDW: 15 % (ref 11.5–15.5)
WBC Count: 9.3 10*3/uL (ref 4.0–10.5)
nRBC: 0 % (ref 0.0–0.2)

## 2018-09-02 LAB — CMP (CANCER CENTER ONLY)
ALK PHOS: 1072 U/L — AB (ref 38–126)
ALT: 72 U/L — ABNORMAL HIGH (ref 0–44)
AST: 85 U/L — ABNORMAL HIGH (ref 15–41)
Albumin: 2.6 g/dL — ABNORMAL LOW (ref 3.5–5.0)
Anion gap: 6 (ref 5–15)
BUN: 10 mg/dL (ref 8–23)
CO2: 24 mmol/L (ref 22–32)
Calcium: 8.4 mg/dL — ABNORMAL LOW (ref 8.9–10.3)
Chloride: 109 mmol/L (ref 98–111)
Creatinine: 0.65 mg/dL (ref 0.44–1.00)
GFR, Est AFR Am: >60 mL/min (ref >60)
GFR, Estimated: >60 mL/min (ref >60)
Glucose, Bld: 76 mg/dL (ref 70–99)
Potassium: 4.2 mmol/L (ref 3.5–5.1)
Sodium: 139 mmol/L (ref 135–145)
Total Bilirubin: 6.6 mg/dL (ref 0.3–1.2)
Total Protein: 6.5 g/dL (ref 6.5–8.1)

## 2018-09-02 MED ORDER — SODIUM CHLORIDE 0.9% FLUSH
10.0000 mL | Freq: Once | INTRAVENOUS | Status: AC
  Administered 2018-09-02: 10 mL
  Filled 2018-09-02: qty 10

## 2018-09-02 MED ORDER — HEPARIN SOD (PORK) LOCK FLUSH 100 UNIT/ML IV SOLN
500.0000 [IU] | Freq: Once | INTRAVENOUS | Status: AC
  Administered 2018-09-02: 500 [IU]
  Filled 2018-09-02: qty 5

## 2018-09-02 NOTE — Addendum Note (Signed)
Addended by: Truitt Merle on: 09/02/2018 11:51 AM   Modules accepted: Orders

## 2018-09-02 NOTE — Progress Notes (Signed)
  Oncology Nurse Navigator Documentation  Navigator Location: [facility]- (09/02/18 0932)   Navigator Encounter Type: Follow-up Appt (09/02/18 0932)      Patient will be starting oral Xeloda today and Gemcitabine and Docetaxel on 09/06/17. Plan to reach out to patient to offer support after new regimen has started. I reminded patient that she can call with questions or concerns.             Barriers/Navigation Needs: No barriers at this time;No Needs (09/02/18 0932)   Interventions: Psycho-social support (09/02/18 0932)            Acuity: Level 2 (09/02/18 0932)         Time Spent with Patient: 15 (09/02/18 0932)

## 2018-09-02 NOTE — Telephone Encounter (Signed)
Arranged for Abdominal US tomorrow 09/03/18 at 9:00 am at [facility], called patient informed her of time and location, instructed her to have nothing to eat or drink past midnight and to arrive about 15 to 20 minutes prior to scheduled time.  She verbalized an understanding and will keep this appointment.

## 2018-09-02 NOTE — Telephone Encounter (Signed)
Cancelled appointments per 01/13 los, and scheduled appointments per 01/13 los.  Patient wanted the nutrition appointment cancelled as well.

## 2018-09-02 NOTE — Telephone Encounter (Signed)
Cancelled appointments per 01/13 los, and scheduled appointments per 01/13 los.  Patient wanted the nutrition appointment cancelled as well.  Printed calendar and avs.

## 2018-09-03 ENCOUNTER — Ambulatory Visit (HOSPITAL_COMMUNITY)
Admission: RE | Admit: 2018-09-03 | Discharge: 2018-09-03 | Disposition: A | Discharge status: Home / Self Care | Payer: Medicare Other | Source: Ambulatory Visit | Attending: Hematology | Admitting: Hematology

## 2018-09-03 DIAGNOSIS — C259 Malignant neoplasm of pancreas, unspecified: Secondary | ICD-10-CM | POA: Diagnosis not present

## 2018-09-03 DIAGNOSIS — C787 Secondary malignant neoplasm of liver and intrahepatic bile duct: Secondary | ICD-10-CM | POA: Insufficient documentation

## 2018-09-06 ENCOUNTER — Ambulatory Visit: Payer: Medicare Other

## 2018-09-06 ENCOUNTER — Encounter: Payer: Medicare Other | Admitting: Nutrition

## 2018-09-09 ENCOUNTER — Other Ambulatory Visit: Payer: Self-pay | Admitting: Hematology

## 2018-09-09 ENCOUNTER — Telehealth: Payer: Self-pay

## 2018-09-09 DIAGNOSIS — C787 Secondary malignant neoplasm of liver and intrahepatic bile duct: Principal | ICD-10-CM

## 2018-09-09 DIAGNOSIS — C259 Malignant neoplasm of pancreas, unspecified: Secondary | ICD-10-CM

## 2018-09-09 NOTE — Telephone Encounter (Signed)
Left message for patient per Dr. Burr Medico she is in touch with GI about doing a procedure to help relieve the obstructive jaundice being caused by her pancreatic cancer.  We will back in touch as soon as Dr. Burr Medico hears.

## 2018-09-10 ENCOUNTER — Other Ambulatory Visit: Payer: Self-pay

## 2018-09-10 ENCOUNTER — Other Ambulatory Visit: Payer: Self-pay | Admitting: Hematology

## 2018-09-10 DIAGNOSIS — R17 Unspecified jaundice: Secondary | ICD-10-CM

## 2018-09-12 ENCOUNTER — Other Ambulatory Visit: Payer: Self-pay | Admitting: Hematology

## 2018-09-12 ENCOUNTER — Ambulatory Visit (HOSPITAL_COMMUNITY): Admission: RE | Admit: 2018-09-12 | Payer: Medicare Other | Source: Ambulatory Visit

## 2018-09-12 DIAGNOSIS — R17 Unspecified jaundice: Secondary | ICD-10-CM

## 2018-09-13 ENCOUNTER — Other Ambulatory Visit: Payer: Medicare Other

## 2018-09-13 ENCOUNTER — Ambulatory Visit: Payer: Medicare Other

## 2018-09-16 ENCOUNTER — Telehealth: Payer: Self-pay

## 2018-09-16 NOTE — Telephone Encounter (Signed)
Oral Oncology Patient Advocate Encounter  [place] contacted me because the patient has a copay of $138.62 on her Xeloda. There is a $198 deductible on medicare part B this year. I called the patient to get her income information to apply for a grant and she stated she will ask her husband what his income is and call me back tomorrow.  I will continue to update.  [place] [facility] [place] Phone [phone] Fax [phone]

## 2018-09-17 ENCOUNTER — Ambulatory Visit (HOSPITAL_COMMUNITY)
Admission: RE | Admit: 2018-09-17 | Discharge: 2018-09-17 | Disposition: A | Discharge status: Home / Self Care | Payer: Medicare Other | Source: Ambulatory Visit | Attending: Hematology | Admitting: Hematology

## 2018-09-17 DIAGNOSIS — R17 Unspecified jaundice: Secondary | ICD-10-CM

## 2018-09-17 DIAGNOSIS — C259 Malignant neoplasm of pancreas, unspecified: Secondary | ICD-10-CM | POA: Diagnosis not present

## 2018-09-17 DIAGNOSIS — R932 Abnormal findings on diagnostic imaging of liver and biliary tract: Secondary | ICD-10-CM | POA: Diagnosis not present

## 2018-09-17 DIAGNOSIS — C787 Secondary malignant neoplasm of liver and intrahepatic bile duct: Secondary | ICD-10-CM | POA: Diagnosis not present

## 2018-09-17 MED ORDER — GADOBUTROL 1 MMOL/ML IV SOLN
4.0000 mL | Freq: Once | INTRAVENOUS | Status: AC | PRN
  Administered 2018-09-17: 3 mL via INTRAVENOUS

## 2018-09-17 MED FILL — XELODA 500 MG TABLET: 500 | 21 days supply | Qty: 42 | Fill #1

## 2018-09-17 NOTE — Telephone Encounter (Signed)
Oral Oncology Patient Advocate Encounter  I was successful at securing a grant with [employer] for $4,500. This will keep the out of pocket expense for Xeloda at $0. The grant information is as follows and has been shared with [place].  Approval dates: 09/17/18-2018-10-20 This a conditional approval. The patient is mailing me her income documents to fax to cancer care to get full approval. ID: [id number] Group: [id number] BIN: [id number] PCN: [id number]  I called the patient and gave her the good news, she verbalized understanding and great appreciation.   [place] [facility] [place] Phone [phone] Fax [phone]

## 2018-09-19 ENCOUNTER — Telehealth: Payer: Self-pay | Admitting: Hematology

## 2018-09-19 ENCOUNTER — Other Ambulatory Visit: Payer: Self-pay | Admitting: Hematology

## 2018-09-19 DIAGNOSIS — C787 Secondary malignant neoplasm of liver and intrahepatic bile duct: Principal | ICD-10-CM

## 2018-09-19 DIAGNOSIS — C259 Malignant neoplasm of pancreas, unspecified: Secondary | ICD-10-CM

## 2018-09-19 NOTE — Telephone Encounter (Signed)
Scheduled appt per 1/30 sch message - pt is aware  

## 2018-09-20 ENCOUNTER — Telehealth: Payer: Self-pay

## 2018-09-20 ENCOUNTER — Other Ambulatory Visit: Payer: Self-pay

## 2018-09-20 ENCOUNTER — Inpatient Hospital Stay (HOSPITAL_BASED_OUTPATIENT_CLINIC_OR_DEPARTMENT_OTHER): Payer: Medicare Other | Admitting: Hematology

## 2018-09-20 ENCOUNTER — Encounter: Payer: Self-pay | Admitting: Hematology

## 2018-09-20 ENCOUNTER — Inpatient Hospital Stay: Payer: Medicare Other

## 2018-09-20 VITALS — BP 94/60 | HR 83 | Temp 97.6°F | Resp 18 | Ht 64.0 in | Wt 74.7 lb

## 2018-09-20 DIAGNOSIS — C259 Malignant neoplasm of pancreas, unspecified: Secondary | ICD-10-CM

## 2018-09-20 DIAGNOSIS — R630 Anorexia: Secondary | ICD-10-CM | POA: Diagnosis not present

## 2018-09-20 DIAGNOSIS — R42 Dizziness and giddiness: Secondary | ICD-10-CM

## 2018-09-20 DIAGNOSIS — K831 Obstruction of bile duct: Secondary | ICD-10-CM | POA: Diagnosis not present

## 2018-09-20 DIAGNOSIS — Z452 Encounter for adjustment and management of vascular access device: Secondary | ICD-10-CM | POA: Diagnosis not present

## 2018-09-20 DIAGNOSIS — Z800 Family history of malignant neoplasm of digestive organs: Secondary | ICD-10-CM

## 2018-09-20 DIAGNOSIS — J439 Emphysema, unspecified: Secondary | ICD-10-CM | POA: Diagnosis not present

## 2018-09-20 DIAGNOSIS — I2510 Atherosclerotic heart disease of native coronary artery without angina pectoris: Secondary | ICD-10-CM | POA: Diagnosis not present

## 2018-09-20 DIAGNOSIS — C787 Secondary malignant neoplasm of liver and intrahepatic bile duct: Principal | ICD-10-CM

## 2018-09-20 DIAGNOSIS — Z9221 Personal history of antineoplastic chemotherapy: Secondary | ICD-10-CM

## 2018-09-20 DIAGNOSIS — F419 Anxiety disorder, unspecified: Secondary | ICD-10-CM | POA: Diagnosis not present

## 2018-09-20 DIAGNOSIS — Z7189 Other specified counseling: Secondary | ICD-10-CM

## 2018-09-20 DIAGNOSIS — I700 Atherosclerosis of aorta: Secondary | ICD-10-CM

## 2018-09-20 DIAGNOSIS — F17210 Nicotine dependence, cigarettes, uncomplicated: Secondary | ICD-10-CM

## 2018-09-20 DIAGNOSIS — Z79899 Other long term (current) drug therapy: Secondary | ICD-10-CM

## 2018-09-20 LAB — CBC WITH DIFFERENTIAL (CANCER CENTER ONLY)
Abs Immature Granulocytes: 0.05 10*3/uL (ref 0.00–0.07)
Basophils Absolute: 0 10*3/uL (ref 0.0–0.1)
Basophils Relative: 0 %
Eosinophils Absolute: 0 10*3/uL (ref 0.0–0.5)
Eosinophils Relative: 0 %
HEMATOCRIT: 20.4 % — AB (ref 36.0–46.0)
Hemoglobin: 7.2 g/dL — ABNORMAL LOW (ref 12.0–15.0)
Immature Granulocytes: 1 %
LYMPHS ABS: 0.7 10*3/uL (ref 0.7–4.0)
LYMPHS PCT: 7 %
MCH: 36 pg — ABNORMAL HIGH (ref 26.0–34.0)
MCHC: 35.3 g/dL (ref 30.0–36.0)
MCV: 102 fL — ABNORMAL HIGH (ref 80.0–100.0)
Monocytes Absolute: 0.4 10*3/uL (ref 0.1–1.0)
Monocytes Relative: 4 %
Neutro Abs: 8.5 10*3/uL — ABNORMAL HIGH (ref 1.7–7.7)
Neutrophils Relative %: 88 %
Platelet Count: 190 10*3/uL (ref 150–400)
RBC: 2 MIL/uL — ABNORMAL LOW (ref 3.87–5.11)
RDW: 21.2 % — ABNORMAL HIGH (ref 11.5–15.5)
WBC Count: 9.6 10*3/uL (ref 4.0–10.5)
nRBC: 0.2 % (ref 0.0–0.2)

## 2018-09-20 LAB — CMP (CANCER CENTER ONLY)
ALT: 79 U/L — ABNORMAL HIGH (ref 0–44)
AST: 86 U/L — ABNORMAL HIGH (ref 15–41)
Albumin: 2.3 g/dL — ABNORMAL LOW (ref 3.5–5.0)
Alkaline Phosphatase: 1566 U/L — ABNORMAL HIGH (ref 38–126)
Anion gap: 10 (ref 5–15)
BUN: 16 mg/dL (ref 8–23)
CHLORIDE: 114 mmol/L — AB (ref 98–111)
CO2: 19 mmol/L — AB (ref 22–32)
Calcium: 8.6 mg/dL — ABNORMAL LOW (ref 8.9–10.3)
Creatinine: 0.92 mg/dL (ref 0.44–1.00)
GFR, Est AFR Am: >60 mL/min (ref >60)
GFR, Estimated: >60 mL/min (ref >60)
GLUCOSE: 134 mg/dL — AB (ref 70–99)
Potassium: 2.6 mmol/L — CL (ref 3.5–5.1)
SODIUM: 143 mmol/L (ref 135–145)
Total Bilirubin: 11.2 mg/dL (ref 0.3–1.2)
Total Protein: 6.8 g/dL (ref 6.5–8.1)

## 2018-09-20 MED ORDER — SODIUM CHLORIDE 0.9% FLUSH
10.0000 mL | Freq: Once | INTRAVENOUS | Status: AC
  Administered 2018-09-20: 10 mL
  Filled 2018-09-20: qty 10

## 2018-09-20 MED ORDER — HEPARIN SOD (PORK) LOCK FLUSH 100 UNIT/ML IV SOLN
500.0000 [IU] | Freq: Once | INTRAVENOUS | Status: AC
  Administered 2018-09-20: 500 [IU]
  Filled 2018-09-20: qty 5

## 2018-09-20 MED ORDER — POTASSIUM CHLORIDE CRYS ER 20 MEQ PO TBCR: 20.0000 meq | EXTENDED_RELEASE_TABLET | Freq: Two times a day (BID) | ORAL | 0 refills | Status: AC

## 2018-09-20 NOTE — Telephone Encounter (Signed)
-----   Message from Milus Banister, MD sent at 09/20/2018  5:59 AM EST ----- Regarding: ERCP for next thursday (feb 6th) Hey, Can you see about adding her on to the end of my day Thursday Feb 6th.  I imagine I'll be running earlier than the schedule shows.  Thanks  ----- Message ----- From: Irving Copas., MD Sent: 09/19/2018   3:47 PM EST To: Milus Banister, MD, Timothy Lasso, RN, # Subject: Patient for ERCP                               Krista Blue, Thank you for the update.I did review the MRI/MRCP with Dan as well.We would be happy to be of assistance for this patient.Kinda Pottle, can you look and see when our next available opening for ERCP will be and please let us know?  1 of [place] can work on our schedules if necessary to try and accommodate within the next week to week and a half.Again, I think getting a repeat set of liver tests would be helpful as well to dictate how things are at this point in time.Gabe ----- Message ----- From: Truitt Merle, MD Sent: 09/19/2018  11:03 AM EST To: Milus Banister, MD, #  Dr. Paulita Fujita did not feel comfortable to do the ERCP after he reviewed the MRI/MRCP. Could you review her scan and let me know if you can try?  Thanks much,  Krista Blue  ----- Message ----- From: Irving Copas., MD Sent: 09/09/2018   1:46 PM EST To: Milus Banister, MD, Truitt Merle, MD  Krista Blue, I think you had sent this to [place] last week. She is an [facility] GI patient. Were you able to get a hold of them about trying to get her in? I think it is reasonable to give an attempt. If they do not have availability, then I can try and add-on on Wednesday, but we always let the primary GI group have the first attempt at continuing care. Thanks. Gabe ----- Message ----- From: Truitt Merle, MD Sent: 09/09/2018  12:39 PM EST To: Milus Banister, MD, #  Linna Hoff and Chester Holstein,  Could you review her [place] to see if she is a candidate for ERCP? Could you get her in this week? She has metastatic pancreatic cancer, and  developed obstructive jaundice after off chemo for 2 months   Thanks   Krista Blue

## 2018-09-20 NOTE — Progress Notes (Signed)
[place]   [phone] [phone]   Clinic Follow up Note   Patient Care Team: Wilford Corner, MD as PCP - General (Gastroenterology) Truitt Merle, MD as Consulting Physician (Hematology)  Date of Service:  09/20/2018  CHIEF COMPLAINT: F/u of metastatic pancreatic cancer  SUMMARY OF ONCOLOGIC HISTORY: Oncology History   Cancer Staging Pancreatic cancer metastasized to liver [facility]) Staging form: Exocrine Pancreas, AJCC 8th Edition - Clinical stage from 07/06/2017: Stage IV (cTX, cNX, pM1) - Signed by Truitt Merle, MD on 07/23/2017       Pancreatic cancer metastasized to liver ([place])   06/21/2017 Imaging    CT CAP IMPRESSION: 1. Indistinct low-attenuation mass in the uncinate process of the pancreas with dilatation of remainder of the pancreatic duct and some encasement of the SMA. These findings are highly indicative of pancreatic carcinoma. Recommend MRI to assess further. 2. Multiple low-attenuation lesions throughout the right and left lobes of liver most consistent with liver metastasis again MRI may be helpful. 3. Moderate change of abdominal aortic atherosclerosis. 4. Changes of centrilobular and paraseptal emphysema on CT of the chest. No lung lesion is seen.     07/06/2017 Initial Biopsy    Diagnosis Liver, needle/core biopsy, Right lobe - METASTATIC ADENOCARCINOMA.    07/10/2017 Initial Diagnosis    Pancreatic cancer metastasized to liver ([place])    07/25/2017 - 01/22/2018 Chemotherapy    Gemcitabine and Abraxane 3 weeks on and 1 week off starting 07/25/17 and stopped 01/22/18 due to disease progression.    10/02/2017 Imaging    CT CAP W Contrast 10/02/17 IMPRESSION: 1. Reduced size of the primary pancreatic neoplasm, which still continues to encase the superior mesenteric artery and occlude the superior mesenteric vein. The numerous metastatic liver lesions show some improvement on portal venous phase images, with increase in  the central necrosis and slight reduction in size. However, the arterial phase images, which are present today but were not available on the prior exam, demonstrate that there is a significant peripheral rind around each of these lesions and that the portal venous phase images underestimate total lesions size. No findings of metastatic disease to the chest or skeleton. 2. Severe and worsened cachexia. 3. Low-grade mesenteric edema and a small amount of pelvic ascites. 4. Aortic Atherosclerosis (ICD10-I70.0) and Emphysema (ICD10-J43.9). 5.  Prominent stool throughout the colon favors constipation.    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  The Hereditary Gene Panel offered by [facility] includes sequencing and/or deletion duplication testing of the following 47 genes: APC, ATM, AXIN2, BARD1, BMPR1A, BRCA1, BRCA2, BRIP1, CDH1, CDK4, CDKN2A (p14ARF), CDKN2A (p16INK4a), CHEK2, CTNNA1, DICER1, EPCAM (Deletion/duplication testing only), GREM1 (promoter region deletion/duplication testing only), KIT, MEN1, MLH1, MSH2, MSH3, MSH6, MUTYH, NBN, NF1, NHTL1, PALB2, PDGFRA, PMS2, POLD1, POLE, PTEN, RAD50, RAD51C, RAD51D, SDHB, SDHC, SDHD, SMAD4, SMARCA4. STK11, TP53, TSC1, TSC2, and VHL.  The following genes were evaluated for sequence changes only: SDHA and HOXB13 c.251G>A variant only. The report date is November 27, 2017.     01/08/2018 Imaging    CT CAP W Contrast 01/08/18  IMPRESSION: 1. Overall, there appears to be slight progression of disease, as evidenced by slight growth of the primary pancreatic lesion. While some of the previously noted hepatic lesions appear to have resolved or gotten smaller, others appear to be new or are slightly larger than the prior examination, as detailed above. 2. Several unusual appearing  pulmonary nodules scattered throughout the lungs bilaterally, some of which are solid  while others are more subsolid in appearance. Although these could be infectious or inflammatory in etiology, the possibility of developing metastatic disease in the lungs should be considered, and close attention on follow-up studies is recommended. 3. Aortic atherosclerosis. 4. Additional incidental findings, as above. Aortic Atherosclerosis (ICD10-I70.0).     02/04/2018 - 04/02/2018 Chemotherapy    Second line 5-fu pump and liposomal irinotecan every 2 weeks started on 02/05/2018 and due to disease progression, stopped on 04/02/18    04/12/2018 Imaging    IMPRESSION: 1. Interval increase in size and development of multiple new low-attenuation lesions throughout the liver 2. Slight interval increase in size of primary pancreatic lesion. 3. Previously described pulmonary nodules are decreased in size or resolved when compared to prior exam, potentially resolving infectious/inflammatory process. 4. Interval increase in size of sclerotic lesion within the L2 vertebral body, potentially representing osseous metastatic disease.    04/27/2018 - 07/17/2018 Chemotherapy    3rd line FOLFOX q2 weeks starting 04/27/18. Dose reduced from cycle 2. Stopped after 07/17/18 due to slight disease progression    07/10/2018 Imaging    CT CAP W Contrast 07/10/18 IMPRESSION: 1. Again seen are multifocal liver metastases. Liver lesions are stable or minimally increased in size in the interval. 2. Interval decrease in size of primary pancreatic lesion. 3. Moderate to marked intrahepatic and proximal common bile duct dilatation. There is a new cystic mass within the head of pancreas which appears to have mass effect upon the distal CBD. Given the advanced changes of chronic pancreatitis this may represent a new pseudo cysts. 4. 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 The portal vein and splenic veins remain patent. 5. Stable  to mild increase in size of L2 sclerotic lesion.    09/17/2018 Imaging    MRI Abdomen 09/17/18  IMPRESSION: 1. Interval development of a large conglomeration of cystic disease in the head and body of pancreas measuring 10 x 5 x 7 cm. The common bile duct is markedly dilated and then tapers as it tracks past this new cystic involvement. Mass-effect by the cysts may be the etiology for the biliary obstruction. Distal common bile duct into the ampulla is not visualized on this study. 2. Similar appearance of liver disease. 3. Portal vein and splenic vein are patent. Patency of the superior mesenteric vein can not be confirmed on this study and the vessel may be markedly attenuated or occluded in the region of the portal splenic confluence.      CURRENT THERAPY:  Observation  INTERVAL HISTORY:  Tammy Adams is here for a follow up of pancreatic cancer. She presents to the clinic today with her husband. She notes she noticed more jaundice of her eyes. She notes her stool is hardening but still having bowel movements. She notes pain in her upper abdomen. She also notes dizziness upon standing. She notes she is eating well. Creon has been helping her diarrhea.      REVIEW OF SYSTEMS:   Constitutional: Denies fevers, chills (+) adequate eating but still losing weight  Eyes: Denies blurriness of vision Ears, nose, mouth, throat, and face: Denies mucositis or sore throat (+) Jaundice of eyes  Respiratory: Denies cough, dyspnea or wheezes Cardiovascular: Denies palpitation, chest discomfort or lower extremity swelling (+) dizziness upon standing.  Gastrointestinal:  Denies nausea, heartburn (+) Diarrhea  Skin: Denies abnormal skin rashes  Lymphatics: Denies new lymphadenopathy or easy bruising Neurological:Denies numbness, tingling or new weaknesses Behavioral/Psych: Mood is stable, no new changes  All other systems were reviewed with the patient and are negative.  MEDICAL HISTORY:  Past  Medical History:  Diagnosis Date  . Anxiety   . Family history of colon cancer   . Family history of pancreatic cancer   . Family history of prostate cancer   . pancreatic ca dx'd 06/2017    SURGICAL HISTORY: Past Surgical History:  Procedure Laterality Date  . IR FLUORO GUIDE PORT INSERTION RIGHT  07/24/2017  . IR US GUIDE VASC ACCESS RIGHT  07/24/2017    I have reviewed the social history and family history with the patient and they are unchanged from previous note.  ALLERGIES:  has No Known Allergies.  MEDICATIONS:  Current Outpatient Medications  Medication Sig Dispense Refill  . capecitabine (XELODA) 500 MG tablet Take 2 tablets (104m) by mouth in AM & 1 tab (5058m in PM, immediately after food. Take for 14 days on, 7 days off, repeated every 21 d 42 tablet 2  . diphenoxylate-atropine (LOMOTIL) 2.5-0.025 MG tablet Take 1-2 tablets by mouth 4 (four) times daily as needed for diarrhea or loose stools. 30 tablet 2  . dronabinol (MARINOL) 2.5 MG capsule TAKE 1 CAPSULE BY MOUTH TWICE DAILY BEFORE MEAL(S) 60 capsule 0  . LORazepam (ATIVAN) 1 MG tablet Take 1 mg 2 (two) times daily by mouth.    . mirtazapine (REMERON) 15 MG tablet TAKE 1 TABLET BY MOUTH AT BEDTIME 30 tablet 0  . Multiple Vitamin (MULTIVITAMIN WITH MINERALS) TABS tablet Take 1 tablet daily by mouth. One-A-Day for Women    . Pancrelipase, Lip-Prot-Amyl, (CREON) 24000-76000 units CPEP Take 1 capsule (24,000 Units total) by mouth 3 (three) times daily before meals. 180 capsule 2  . potassium chloride SA (K-DUR,KLOR-CON) 20 MEQ tablet Take 1 tablet (20 mEq total) by mouth 2 (two) times daily. Take 4 tablets (20 meq) today, then take one tablet twice daily. 64 tablet 0   No current facility-administered medications for this visit.     PHYSICAL EXAMINATION: ECOG PERFORMANCE STATUS: 2 - Symptomatic, <50% confined to bed  Vitals:   09/20/18 1045  BP: 94/60  Pulse: 83  Resp: 18  Temp: 97.6 F (36.4 C)  SpO2: 100%    Filed Weights   09/20/18 1045  Weight: 74 lb 11.2 oz (33.9 kg)    GENERAL:alert, no distress and comfortable SKIN: skin color, texture, turgor are normal, no rashes or significant lesions EYES: normal, Conjunctiva are pink and non-injected, sclera clear OROPHARYNX:no exudate, no erythema and lips, buccal mucosa, and tongue normal  NECK: supple, thyroid normal size, non-tender, without nodularity LYMPH:  no palpable lymphadenopathy in the cervical, axillary or inguinal LUNGS: clear to auscultation and percussion with normal breathing effort HEART: regular rate & rhythm and no murmurs and no lower extremity edema ABDOMEN:abdomen soft, non-tender and normal bowel sounds Musculoskeletal:no cyanosis of digits and no clubbing  NEURO: alert & oriented x 3 with fluent speech, no focal motor/sensory deficits  LABORATORY DATA:  I have reviewed the data as listed CBC Latest Ref Rng & Units 09/20/2018 09/02/2018 07/17/2018  WBC 4.0 - 10.5 K/uL 9.6 9.3 7.7  Hemoglobin 12.0 - 15.0 g/dL 7.2(L) 9.6(L) 10.5(L)  Hematocrit 36.0 - 46.0 % 20.4(L) 27.8(L) 31.0(L)  Platelets 150 - 400 K/uL 190 187 126(L)     CMP Latest Ref Rng & Units 09/20/2018 09/02/2018 07/17/2018  Glucose 70 - 99  mg/dL 134(H) 76 169(H)  BUN 8 - 23 mg/dL '16 10 15  ' Creatinine 0.44 - 1.00 mg/dL 0.92 0.65 0.78  Sodium 135 - 145 mmol/L 143 139 141  Potassium 3.5 - 5.1 mmol/L 2.6(LL) 4.2 3.4(L)  Chloride 98 - 111 mmol/L 114(H) 109 109  CO2 22 - 32 mmol/L 19(L) 24 21(L)  Calcium 8.9 - 10.3 mg/dL 8.6(L) 8.4(L) 8.4(L)  Total Protein 6.5 - 8.1 g/dL 6.8 6.5 6.5  Total Bilirubin 0.3 - 1.2 mg/dL 11.2(HH) 6.6(HH) 0.4  Alkaline Phos 38 - 126 U/L 1,566(H) 1,072(H) 267(H)  AST 15 - 41 U/L 86(H) 85(H) 80(H)  ALT 0 - 44 U/L 79(H) 72(H) 118(H)      RADIOGRAPHIC STUDIES: I have personally reviewed the radiological images as listed and agreed with the findings in the report. No results found.   ASSESSMENT & PLAN:  Lema Heinkel is a 68 y.o.  female with   1. 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 She has progressed through first-line chemo Gem/Abraxane, second-lineliposomal-irinotecan and 5FUand third-line FOLFOX.  -I discussed after 3rd-line treatment we no longer have standard lines ofchemotherapy. She decided to take a chemo break after last chemo on 07/17/2018 -We discussed her MRI abdomen from 09/17/18 showed her pancreatic mass has signifiantly grown to 10cm. Showing disease progressive from being off treatment. She is becoming symptomatic with abdominal discomfort and gas, jaundice and abnormal liver function.  -Due to her poor liver function she is no longer eligible for chemotherapy at this stage.  -I have discussed with GI Dr. Maryfrances Bunnell who is not comfortable to try ERCP on her. I have referred her to GI Dr. Ardis Hughs or Dr. Cleda Clarks for ERCP. -If stent placement is not successful, percutaneous biliary draining is another option.  Discussed pros and cons of each procedure. She agrees with trying ERCP first  -I discussed if her liver function was to improve after ERCP, and her PS also improves, we may consider Xeloda as 4th line chemo, although the response rate is low.  -Labs reviewed today, CBC show Hg decreased to 7.2, ANC at 8.5. CMP is still pending. Given her low Hg, I recommend a blood transfusion next week. She is agreeable, but wants to wait until next week  -As she is not on active treatment now, she is eligible for palliative home care, and I also discussed hospice, I explained the service to her and her husband. She declined both for now. We also discussed goal of care.    2. Obstructive jaundice  -She previously developed jaundiced, secondary to her pancreatic cancer. -Her MRI abdomen from 09/17/18 showed her pancreatic mass has grown to 10cm.  -She opted to proceed with ERCP and stent placement.  -We discussed that the high risk of  cholangitis secondary to obstruction thus, she knows to call me or go to emergency room if she has fever, abdominal pain or other new symptoms. -She has no pruritus now, I suggested her to use Benadryl if she develops skin itchiness.  3. Anorexia and weight loss -Continue to follow up with Dietician as needed -She has weaned off Ativan.Currently on Marinol and Mirtazapine -Appetite and eating adequate, but weight slowly trending down.    4. Smoking Cessation  -She is currently trying to Nicoderm CQ patch, and working on quitting. I encouraged her to continue   5. Goal of care discussion, DRN/DNI -We again discussed the incurable nature of her cancer, and the overall poor prognosis, especially  she has further cancer progression now after multiple line of chemo -The patient understands the goal of care is palliative and make treat her symptoms.  -I recommend DNR/DNI, she has agreed to DNR/DNI, but left her filled out DNR at home.  -She notes her POA is her husband Ronalee Belts.     Plan -Will continue observation and supportive care for now -Her tbil has increased to 11 today. -she is scheduled to have ERCP with Dr. Ardis Hughs on 2/6.  -She knows to call me or go to emergency room if she develops fever, abdominal pain or other new symptoms. -Lab and flush on 2/3 -4hr blood transfusion on 2/5 -I discussed with her brother Dr. Laurann Montana yesterday    No problem-specific Assessment & Plan notes found for this encounter.   No orders of the defined types were placed in this encounter.  All questions were answered. The patient knows to call the clinic with any problems, questions or concerns. No barriers to learning was detected. I spent 25 minutes counseling the patient face to face. The total time spent in the appointment was 35 minutes and more than 50% was on counseling and review of test results     Truitt Merle, MD 09/20/2018   I, Joslyn Devon, am acting as scribe for Truitt Merle, MD.    I have reviewed the above documentation for accuracy and completeness, and I agree with the above.

## 2018-09-20 NOTE — Telephone Encounter (Signed)
-----   Message from Truitt Merle, MD sent at 09/19/2018  4:15 PM EST ----- Regarding: RE: Patient for ERCP Thanks much Chester Holstein and Dan   I will get her in for lab and f/u with me tomorrow  Krista Blue  ----- Message ----- From: Irving Copas., MD Sent: 09/19/2018   3:47 PM EST To: Milus Banister, MD, Timothy Lasso, RN, # Subject: Patient for ERCP                               Krista Blue, Thank you for the update.I did review the MRI/MRCP with Dan as well.We would be happy to be of assistance for this patient.Charlesia Canaday, can you look and see when our next available opening for ERCP will be and please let us know?  1 of [place] can work on our schedules if necessary to try and accommodate within the next week to week and a half.Again, I think getting a repeat set of liver tests would be helpful as well to dictate how things are at this point in time.Gabe ----- Message ----- From: Truitt Merle, MD Sent: 09/19/2018  11:03 AM EST To: Milus Banister, MD, #  Dr. Paulita Fujita did not feel comfortable to do the ERCP after he reviewed the MRI/MRCP. Could you review her scan and let me know if you can try?  Thanks much,  Krista Blue  ----- Message ----- From: Irving Copas., MD Sent: 09/09/2018   1:46 PM EST To: Milus Banister, MD, Truitt Merle, MD  Krista Blue, I think you had sent this to [place] last week. She is an [facility] patient. Were you able to get a hold of them about trying to get her in? I think it is reasonable to give an attempt. If they do not have availability, then I can try and add-on on Wednesday, but we always let the primary GI group have the first attempt at continuing care. Thanks. Gabe ----- Message ----- From: Truitt Merle, MD Sent: 09/09/2018  12:39 PM EST To: Milus Banister, MD, #  Linna Hoff and Chester Holstein,  Could you review her [place] to see if she is a candidate for ERCP? Could you get her in this week? She has metastatic pancreatic cancer, and developed obstructive jaundice after off chemo for 2 months   Thanks    Krista Blue

## 2018-09-20 NOTE — Telephone Encounter (Signed)
Printed avs and calender of upcoming appointment. Per 1/31 los 

## 2018-09-20 NOTE — Telephone Encounter (Signed)
Tammy Merle, MD  Mansouraty, Telford Nab., MD; Milus Banister, MD; Timothy Lasso, RN        Her TBIL is 11 today, no signs of infection or pruritis now. She agrees with ERCP, or percutaneous drainage if ERCP unsuccessful. Hope you can get her in next week, ASAP, thanks much. I told her to go to ED if she became symptomatic.

## 2018-09-20 NOTE — Telephone Encounter (Signed)
ERCP scheduled, pt instructed and medications reviewed.  Patient instructions mailed to home.  Patient to call with any questions or concerns.  

## 2018-09-23 ENCOUNTER — Telehealth: Payer: Self-pay

## 2018-09-23 ENCOUNTER — Telehealth: Payer: Self-pay | Admitting: Hematology

## 2018-09-23 ENCOUNTER — Other Ambulatory Visit: Payer: Self-pay

## 2018-09-23 ENCOUNTER — Inpatient Hospital Stay: Payer: Medicare Other

## 2018-09-23 ENCOUNTER — Inpatient Hospital Stay: Payer: Medicare Other | Attending: Hematology

## 2018-09-23 ENCOUNTER — Inpatient Hospital Stay (HOSPITAL_COMMUNITY)
Admission: EM | Admit: 2018-09-23 | Discharge: 2018-09-28 | DRG: 435 | Disposition: A | Discharge status: Hospice | Payer: Medicare Other | Attending: Internal Medicine | Admitting: Internal Medicine

## 2018-09-23 ENCOUNTER — Encounter (HOSPITAL_COMMUNITY): Payer: Self-pay | Admitting: Emergency Medicine

## 2018-09-23 DIAGNOSIS — Z801 Family history of malignant neoplasm of trachea, bronchus and lung: Secondary | ICD-10-CM

## 2018-09-23 DIAGNOSIS — Z681 Body mass index (BMI) 19 or less, adult: Secondary | ICD-10-CM | POA: Diagnosis not present

## 2018-09-23 DIAGNOSIS — R531 Weakness: Secondary | ICD-10-CM | POA: Diagnosis not present

## 2018-09-23 DIAGNOSIS — D649 Anemia, unspecified: Secondary | ICD-10-CM | POA: Diagnosis present

## 2018-09-23 DIAGNOSIS — Z515 Encounter for palliative care: Secondary | ICD-10-CM | POA: Diagnosis present

## 2018-09-23 DIAGNOSIS — C259 Malignant neoplasm of pancreas, unspecified: Secondary | ICD-10-CM

## 2018-09-23 DIAGNOSIS — I959 Hypotension, unspecified: Secondary | ICD-10-CM | POA: Diagnosis present

## 2018-09-23 DIAGNOSIS — C787 Secondary malignant neoplasm of liver and intrahepatic bile duct: Principal | ICD-10-CM

## 2018-09-23 DIAGNOSIS — F17210 Nicotine dependence, cigarettes, uncomplicated: Secondary | ICD-10-CM | POA: Diagnosis present

## 2018-09-23 DIAGNOSIS — Z9221 Personal history of antineoplastic chemotherapy: Secondary | ICD-10-CM

## 2018-09-23 DIAGNOSIS — Z66 Do not resuscitate: Secondary | ICD-10-CM | POA: Diagnosis present

## 2018-09-23 DIAGNOSIS — R627 Adult failure to thrive: Secondary | ICD-10-CM | POA: Diagnosis present

## 2018-09-23 DIAGNOSIS — R17 Unspecified jaundice: Secondary | ICD-10-CM

## 2018-09-23 DIAGNOSIS — D630 Anemia in neoplastic disease: Secondary | ICD-10-CM | POA: Diagnosis present

## 2018-09-23 DIAGNOSIS — D508 Other iron deficiency anemias: Secondary | ICD-10-CM | POA: Diagnosis not present

## 2018-09-23 DIAGNOSIS — K8689 Other specified diseases of pancreas: Secondary | ICD-10-CM

## 2018-09-23 DIAGNOSIS — R109 Unspecified abdominal pain: Secondary | ICD-10-CM | POA: Diagnosis not present

## 2018-09-23 DIAGNOSIS — E876 Hypokalemia: Secondary | ICD-10-CM | POA: Diagnosis present

## 2018-09-23 DIAGNOSIS — E43 Unspecified severe protein-calorie malnutrition: Secondary | ICD-10-CM | POA: Diagnosis present

## 2018-09-23 DIAGNOSIS — R42 Dizziness and giddiness: Secondary | ICD-10-CM | POA: Diagnosis not present

## 2018-09-23 DIAGNOSIS — K831 Obstruction of bile duct: Secondary | ICD-10-CM | POA: Diagnosis present

## 2018-09-23 DIAGNOSIS — Z539 Procedure and treatment not carried out, unspecified reason: Secondary | ICD-10-CM | POA: Diagnosis present

## 2018-09-23 DIAGNOSIS — Z800 Family history of malignant neoplasm of digestive organs: Secondary | ICD-10-CM

## 2018-09-23 DIAGNOSIS — F419 Anxiety disorder, unspecified: Secondary | ICD-10-CM | POA: Diagnosis present

## 2018-09-23 DIAGNOSIS — N19 Unspecified kidney failure: Secondary | ICD-10-CM

## 2018-09-23 LAB — CMP (CANCER CENTER ONLY)
ALBUMIN: 2.2 g/dL — AB (ref 3.5–5.0)
ALT: 78 U/L — ABNORMAL HIGH (ref 0–44)
AST: 85 U/L — ABNORMAL HIGH (ref 15–41)
Alkaline Phosphatase: 1382 U/L — ABNORMAL HIGH (ref 38–126)
Anion gap: 6 (ref 5–15)
BUN: 28 mg/dL — ABNORMAL HIGH (ref 8–23)
CO2: 19 mmol/L — AB (ref 22–32)
Calcium: 8.4 mg/dL — ABNORMAL LOW (ref 8.9–10.3)
Chloride: 119 mmol/L — ABNORMAL HIGH (ref 98–111)
Creatinine: 0.93 mg/dL (ref 0.44–1.00)
GFR, Est AFR Am: >60 mL/min (ref >60)
GFR, Estimated: >60 mL/min (ref >60)
GLUCOSE: 79 mg/dL (ref 70–99)
Potassium: 3.5 mmol/L (ref 3.5–5.1)
Sodium: 144 mmol/L (ref 135–145)
Total Bilirubin: 9.8 mg/dL (ref 0.3–1.2)
Total Protein: 6.6 g/dL (ref 6.5–8.1)

## 2018-09-23 LAB — CBC WITH DIFFERENTIAL (CANCER CENTER ONLY)
Abs Immature Granulocytes: 0.04 10*3/uL (ref 0.00–0.07)
Basophils Absolute: 0 10*3/uL (ref 0.0–0.1)
Basophils Relative: 0 %
Eosinophils Absolute: 0 10*3/uL (ref 0.0–0.5)
Eosinophils Relative: 0 %
HCT: 19.8 % — ABNORMAL LOW (ref 36.0–46.0)
Hemoglobin: 7 g/dL — ABNORMAL LOW (ref 12.0–15.0)
IMMATURE GRANULOCYTES: 0 %
Lymphocytes Relative: 6 %
Lymphs Abs: 0.6 10*3/uL — ABNORMAL LOW (ref 0.7–4.0)
MCH: 36.5 pg — ABNORMAL HIGH (ref 26.0–34.0)
MCHC: 35.4 g/dL (ref 30.0–36.0)
MCV: 103.1 fL — ABNORMAL HIGH (ref 80.0–100.0)
MONOS PCT: 4 %
Monocytes Absolute: 0.4 10*3/uL (ref 0.1–1.0)
Neutro Abs: 9 10*3/uL — ABNORMAL HIGH (ref 1.7–7.7)
Neutrophils Relative %: 90 %
Platelet Count: 192 10*3/uL (ref 150–400)
RBC: 1.92 MIL/uL — ABNORMAL LOW (ref 3.87–5.11)
RDW: 23.6 % — ABNORMAL HIGH (ref 11.5–15.5)
WBC Count: 10.1 10*3/uL (ref 4.0–10.5)
nRBC: 0 % (ref 0.0–0.2)

## 2018-09-23 LAB — PREPARE RBC (CROSSMATCH)

## 2018-09-23 MED ORDER — FUROSEMIDE 10 MG/ML IJ SOLN
20.0000 mg | Freq: Once | INTRAMUSCULAR | Status: DC
  Filled 2018-09-23: qty 2

## 2018-09-23 MED ORDER — SODIUM CHLORIDE 0.9% IV SOLUTION: Freq: Once | INTRAVENOUS | Status: DC

## 2018-09-23 MED ORDER — PANCRELIPASE (LIP-PROT-AMYL) 12000-38000 UNITS PO CPEP
36000.0000 [IU] | ORAL_CAPSULE | Freq: Three times a day (TID) | ORAL | Status: DC
  Administered 2018-09-24 – 2018-09-28 (×13): 36000 [IU] via ORAL
  Filled 2018-09-23: qty 3
  Filled 2018-09-23: qty 1
  Filled 2018-09-23 (×3): qty 3
  Filled 2018-09-23: qty 1
  Filled 2018-09-23: qty 3
  Filled 2018-09-23 (×3): qty 1
  Filled 2018-09-23: qty 3
  Filled 2018-09-23: qty 1
  Filled 2018-09-23: qty 3
  Filled 2018-09-23 (×2): qty 1

## 2018-09-23 MED ORDER — DIPHENHYDRAMINE HCL 25 MG PO CAPS
25.0000 mg | ORAL_CAPSULE | Freq: Once | ORAL | Status: AC
  Administered 2018-09-23: 25 mg via ORAL
  Filled 2018-09-23: qty 1

## 2018-09-23 MED ORDER — MIRTAZAPINE 15 MG PO TABS
15.0000 mg | ORAL_TABLET | Freq: Every day | ORAL | Status: DC
  Administered 2018-09-23 – 2018-09-27 (×5): 15 mg via ORAL
  Filled 2018-09-23 (×3): qty 1
  Filled 2018-09-23: qty 2
  Filled 2018-09-23 (×2): qty 1

## 2018-09-23 MED ORDER — SODIUM CHLORIDE 0.9 % IV SOLN: Freq: Once | INTRAVENOUS | Status: DC

## 2018-09-23 MED ORDER — SODIUM CHLORIDE 0.9% FLUSH
10.0000 mL | Freq: Once | INTRAVENOUS | Status: AC
  Administered 2018-09-23: 10 mL
  Filled 2018-09-23: qty 10

## 2018-09-23 MED ORDER — SODIUM CHLORIDE 0.9 % IV SOLN
10.0000 mL/h | Freq: Once | INTRAVENOUS | Status: AC
  Administered 2018-09-23: 10 mL/h via INTRAVENOUS

## 2018-09-23 MED ORDER — ENSURE ENLIVE PO LIQD
237.0000 mL | Freq: Two times a day (BID) | ORAL | Status: DC
  Administered 2018-09-24 – 2018-09-25 (×3): 237 mL via ORAL

## 2018-09-23 MED ORDER — SODIUM CHLORIDE 0.9 % IV BOLUS
500.0000 mL | Freq: Once | INTRAVENOUS | Status: AC
  Administered 2018-09-23: 500 mL via INTRAVENOUS

## 2018-09-23 MED ORDER — HEPARIN SOD (PORK) LOCK FLUSH 100 UNIT/ML IV SOLN
500.0000 [IU] | Freq: Once | INTRAVENOUS | Status: AC
  Administered 2018-09-23: 500 [IU]
  Filled 2018-09-23: qty 5

## 2018-09-23 NOTE — Telephone Encounter (Signed)
Left voice message for patient to let her know Dr. Burr Medico does want her to get 2 units of blood this Wednesday at 7:30 am.  Informed her she will need to come back either this afternoon or tomorrow to get a Type & Screen done.  Asked that she call back and let me know when she plans to come.

## 2018-09-23 NOTE — ED Notes (Signed)
Hospitialist at bedside.  

## 2018-09-23 NOTE — Telephone Encounter (Signed)
Per 01/31 los.  Appts were already scheduled and the patient treatment is in the book for approval.

## 2018-09-23 NOTE — ED Provider Notes (Signed)
[facility] DEPT Provider Note   CSN: [id number] Arrival date & time: 09/23/18  1508     History   Chief Complaint Chief Complaint  Patient presents with  . Weakness  . Diarrhea    HPI Roxann Vierra is a 68 y.o. female with a hx of pancreatic cancer w/ mets to the liver followed by oncologist Dr. Burr Medico who presents to the ED for generalized weakness over the past 24-48 hours.  Patient states that she feels generally weak and fatigued.  States this sensation is worse when she is up and doing activities or goes from sitting to standing.  With transition and positions she does become lightheaded/dizzy and feels as if she might pass out.  No syncope events have occurred.  No other specific alleviating or aggravating factors.  She is currently undergoing treatment for pancreatic cancer with oncologist Dr. Annamaria Boots, was called to come back to the office today after blood draw for a transfusion, patient felt she was too weak to come in.  Her brother is a physician and spoke with Dr. Ernestina Penna office, it was recommended that she come to the emergency department for evaluation and possible admission.  Patient's husband at bedside states that Dr. Fritz Pickerel office would like to be called. Patient reports some continued abdominal pain and diarrhea, no acute change. States some trouble swallowing, but feels she is eating and drinking. Denies fever, URI sxs, chest pain, dyspnea, vomiting, hematochezia, or melena.   HPI  Past Medical History:  Diagnosis Date  . Anxiety   . Family history of colon cancer   . Family history of pancreatic cancer   . Family history of prostate cancer   . pancreatic ca dx'd 06/2017    Patient Active Problem List   Diagnosis Date Noted  . Genetic testing 11/28/2017  . Family history of pancreatic cancer   . Family history of prostate cancer   . Family history of colon cancer   . Goals of care, counseling/discussion 07/23/2017  . Pancreatic cancer  metastasized to liver [facility]) 07/10/2017    Past Surgical History:  Procedure Laterality Date  . IR FLUORO GUIDE PORT INSERTION RIGHT  07/24/2017  . IR US GUIDE VASC ACCESS RIGHT  07/24/2017     OB History   No obstetric history on file.      Home Medications    Prior to Admission medications   Medication Sig Start Date End Date Taking? Authorizing Provider  capecitabine (XELODA) 500 MG tablet Take 2 tablets (103m) by mouth in AM & 1 tab (50102m in PM, immediately after food. Take for 14 days on, 7 days off, repeated every 21 d Patient taking differently: Take 500-1,000 mg by mouth See admin instructions. Take 2 tablets (100037mby mouth in AM & 1 tab (500m66mn PM, immediately after food. Take for 14 days on, 7 days off, repeated every 21 d 07/29/18   FengTruitt Merle  diphenoxylate-atropine (LOMOTIL) 2.5-0.025 MG tablet Take 1-2 tablets by mouth 4 (four) times daily as needed for diarrhea or loose stools. 08/15/18   FengTruitt Merle  dronabinol (MARINOL) 2.5 MG capsule TAKE 1 CAPSULE BY MOUTH TWICE DAILY BEFORE MEAL(S) Patient taking differently: Take 2.5 mg by mouth 2 (two) times daily before a meal. TAKE 1 CAPSULE BY MOUTH TWICE DAILY BEFORE MEAL(S) 08/15/18   FengTruitt Merle  LORazepam (ATIVAN) 1 MG tablet Take 1 mg by mouth at bedtime.     [provider]  mirtazapine (REMERON) 15 MG  tablet TAKE 1 TABLET BY MOUTH AT BEDTIME Patient taking differently: Take 15 mg by mouth at bedtime.  09/19/18   Truitt Merle, MD  Multiple Vitamin (MULTIVITAMIN WITH MINERALS) TABS tablet Take 1 tablet daily by mouth. One-A-Day for Women    [provider]  Pancrelipase, Lip-Prot-Amyl, (CREON) 24000-76000 units CPEP Take 1 capsule (24,000 Units total) by mouth 3 (three) times daily before meals. 07/03/18   Alla Feeling, NP  potassium chloride SA (K-DUR,KLOR-CON) 20 MEQ tablet Take 1 tablet (20 mEq total) by mouth 2 (two) times daily. Take 4 tablets (20 meq) today, then take one tablet twice  daily. Patient taking differently: Take 20 mEq by mouth daily.  09/20/18   Truitt Merle, MD  prochlorperazine (COMPAZINE) 10 MG tablet Take 1 tablet (10 mg total) by mouth every 6 (six) hours as needed (Nausea or vomiting). 07/23/17 02/04/18  Truitt Merle, MD    Family History Family History  Problem Relation Age of Onset  . Prostate cancer Father        d. 52  . Pancreatic cancer Maternal Aunt 70  . Colon cancer Maternal Grandmother        dx in her 14s  . Prostate cancer Maternal Uncle   . Lung cancer Cousin   . Cancer Cousin        unknown type  . Cancer Cousin 6       daughter of cousin with lung cancer    Social History Social History   Tobacco Use  . Smoking status: Current Every Day Smoker    Packs/day: 0.50    Years: 50.00    Pack years: 25.00  . Smokeless tobacco: Never Used  Substance Use Topics  . Alcohol use: No    Frequency: Never  . Drug use: Yes    Types: Marijuana     Allergies   Patient has no known allergies.   Review of Systems Review of Systems  Constitutional: Positive for fatigue. Negative for chills and fever.  HENT: Positive for trouble swallowing (mild, but able). Negative for congestion, ear pain and sore throat.   Respiratory: Negative for cough and shortness of breath.   Cardiovascular: Negative for chest pain.  Gastrointestinal: Positive for abdominal pain and diarrhea. Negative for anal bleeding, blood in stool, constipation, nausea and vomiting.  Genitourinary: Negative for dysuria.  Neurological: Positive for weakness (generalized) and light-headedness (w/ position transitions. ). Negative for syncope.  All other systems reviewed and are negative.  Physical Exam Updated Vital Signs BP (!) 95/54 (BP Location: Left Arm)   Pulse 78   Temp 97.9 F (36.6 C) (Oral)   Resp 18   SpO2 100%   Physical Exam Vitals signs and nursing note reviewed.  Constitutional:      General: She is not in acute distress.    Appearance: She is  well-developed. She is not toxic-appearing.  HENT:     Head: Normocephalic and atraumatic.  Eyes:     General:        Right eye: No discharge.        Left eye: No discharge.     Extraocular Movements: Extraocular movements intact.     Pupils: Pupils are equal, round, and reactive to light.     Comments: Jaundice noted Some conjunctival pallor.   Neck:     Musculoskeletal: Neck supple.  Cardiovascular:     Rate and Rhythm: Normal rate and regular rhythm.  Pulmonary:     Effort: Pulmonary effort is normal. No  respiratory distress.     Breath sounds: Normal breath sounds. No wheezing, rhonchi or rales.  Abdominal:     Palpations: Abdomen is soft. There is mass.     Tenderness: There is abdominal tenderness (mild R sided). There is no guarding or rebound.  Skin:    General: Skin is warm and dry.     Findings: No rash.  Neurological:     General: No focal deficit present.     Mental Status: She is alert and oriented to person, place, and time.     Comments: Clear speech. CN III -XII Grossly intact.   Psychiatric:        Behavior: Behavior normal.      ED Treatments / Results  Labs (all labs ordered are listed, but only abnormal results are displayed) Labs Reviewed  CBC  COMPREHENSIVE METABOLIC PANEL  TYPE AND SCREEN  PREPARE RBC (CROSSMATCH)  ABO/RH    EKG None  Radiology No results found.  Procedures Procedures (including critical care time)  CRITICAL CARE Performed by: Kennith Maes   Total critical care time: 35  minutes  Critical care time was exclusive of separately billable procedures and treating other patients.  Critical care was necessary to treat or prevent imminent or life-threatening deterioration.  Critical care was time spent personally by me on the following activities: development of treatment plan with patient and/or surrogate as well as nursing, discussions with consultants, evaluation of patient's response to treatment, examination  of patient, obtaining history from patient or surrogate, ordering and performing treatments and interventions, ordering and review of laboratory studies, ordering and review of radiographic studies, pulse oximetry and re-evaluation of patient's condition.   Medications Ordered in ED Medications  sodium chloride 0.9 % bolus 500 mL (500 mLs Intravenous New Bag/Given 09/23/18 1705)  0.9 %  sodium chloride infusion ( Intravenous Rate/Dose Change 09/23/18 1801)  0.9 %  sodium chloride infusion (0 mL/hr Intravenous Stopped 09/23/18 1800)     Initial Impression / Assessment and Plan / ED Course  I have reviewed the triage vital signs and the nursing notes.  Pertinent labs & imaging results that were available during my care of the patient were reviewed by me and considered in my medical decision making (see chart for details).   Patient with hx of pancreatic cancer w/ mets to the liver followed by Dr. Burr Medico presents to the ED for generalized weakness. Nontoxic appearing, pressures are soft but fairly consistent with prior on record. She has a known R sided abdominal mass which is evident on exam, does not seem to have an acute surgical abdomen acutely. Orthostatics obtained positive from laying to sitting, standing deferred secondary to soft pressures.   Outpatient labs reviewed from earlier today:  CMP: Fairly consistent w/ baseline including elevated LFTs, alk phos, & T bili.  CBC: Anemia w/ hgb of 7.0 this is decreased from 9.6 a few weeks prior. No leukocytosis.   16:40: CONSULT: Discussed with oncologist Dr. Burr Medico- plan for type & screen, transfusion of 2 units of PRBCs, and admission for observation.   Discussed with patient & her husband at bedside who are in agreement.   17:51: CONSULT: Discussed case with hospitalist Dr. [name] who accepts admission. Requesting initiating of 75 cc/hr maintenance saline which has been ordered. Also requesting blood transfusion be ordered in addition to PRBCs and  to discuss with Dr. Alvino Chapel regarding how to place additional order.    Reviewed orders w/ Dr. Alvino Chapel who felt them to be correct,  no adjustment recommended at this time, will defer to admitting team.   Final Clinical Impressions(s) / ED Diagnoses   Final diagnoses:  Weakness  Anemia, unspecified type    ED Discharge Orders    None       Leafy Kindle 09/23/18 2327    Davonna Belling, MD 09/24/18 2347

## 2018-09-23 NOTE — ED Triage Notes (Addendum)
Pt was seen at Cancer center earlier and had blood work done. Was called at home and told they didn't get enough blood and needed to come back. Pt was too weak to go back to get more blood work. Family states that Dr Burr Medico said to call and they will place orders on what they want done on patient.  Pt has pancreatic cancer and had diarrhea since diagnosed over year ago.  Saw in patient notes that patient scheduled to have blood transfusion on Wed and was asked to come back to Cancer center for type and screen to be done.

## 2018-09-23 NOTE — H&P (Signed)
Tammy Adams QQV:[id number] DOB: 1950/12/18 DOA: 09/23/2018  PCP: Wilford Corner, MD  In [facility] Chief Complaint:  Weakness dizziness    HPI:  68 year old African-American female Known history of metastatic pancreatic cancer diagnosed 2018 with mets to liver clinical stage IV cTX cNX pM1-metastatic adenocarcinoma status post gemcitabine Abraxane stopped 01/22/2018 due to disease progression-Trial a second line therapy and third line as well  She has been dizzy and weak Recent MRI 09/17/18 showed abd mass ~ 10cm now in size her case was discussed with gastroenterology and she was becoming symptomatic so they were thinking of placing a stent and Dr. Winona Legato was considering this Continued on this Xeloda was going to be tried  At the 1/31 visit she did not want to be transfused--she opted to proceed with stent placement and at that time she was recommended to be DNR DNI  Essentially she is just been overall weak since that time has had no fever no chills no dark stool no nausea no vomiting no other issues She came to emergency room at the request of her family and her brother-in-law who apparently is a physician   ED Course: Patient was given IV fluid bolus typed and screened labs were done and she is feeling better at this time  Review of Systems:   - Chest pain - dark stool - x2- vomit - pruritus - confusion Has not really walked in the past couple months has lost 3 to 4 pounds in the past month and has continued to lose weight since diagnosis Past Medical History:  Diagnosis Date  . Anxiety   . Family history of colon cancer   . Family history of pancreatic cancer   . Family history of prostate cancer   . pancreatic ca dx'd 06/2017    Past Surgical History:  Procedure Laterality Date  . IR FLUORO GUIDE PORT INSERTION RIGHT  07/24/2017  . IR US GUIDE VASC ACCESS RIGHT  07/24/2017     reports that she has been smoking. She has a 25.00 pack-year smoking  history. She has never used smokeless tobacco. She reports current drug use. Drug: Marijuana. She reports that she does not drink alcohol. Mobility: Can hardly walk now needs to be helped up and about previously was able to take care of herself  No Known Allergies  Family History  Problem Relation Age of Onset  . Prostate cancer Father        d. 73  . Pancreatic cancer Maternal Aunt 70  . Colon cancer Maternal Grandmother        dx in her 48s  . Prostate cancer Maternal Uncle   . Lung cancer Cousin   . Cancer Cousin        unknown type  . Cancer Cousin 60       daughter of cousin with lung cancer     Prior to Admission medications   Medication Sig Start Date End Date Taking? Authorizing Provider  diphenoxylate-atropine (LOMOTIL) 2.5-0.025 MG tablet Take 1-2 tablets by mouth 4 (four) times daily as needed for diarrhea or loose stools. 08/15/18  Yes Truitt Merle, MD  dronabinol (MARINOL) 2.5 MG capsule TAKE 1 CAPSULE BY MOUTH TWICE DAILY BEFORE MEAL(S) Patient taking differently: Take 2.5 mg by mouth 2 (two) times daily before a meal. TAKE 1 CAPSULE BY MOUTH TWICE DAILY BEFORE MEAL(S) 08/15/18  Yes Truitt Merle, MD  LORazepam (ATIVAN) 1 MG tablet Take 1 mg by mouth at bedtime.  Yes [provider]  mirtazapine (REMERON) 15 MG tablet TAKE 1 TABLET BY MOUTH AT BEDTIME Patient taking differently: Take 15 mg by mouth at bedtime.  09/19/18  Yes Truitt Merle, MD  Multiple Vitamin (MULTIVITAMIN WITH MINERALS) TABS tablet Take 1 tablet daily by mouth. One-A-Day for Women   Yes [provider]  Pancrelipase, Lip-Prot-Amyl, (CREON) 24000-76000 units CPEP Take 1 capsule (24,000 Units total) by mouth 3 (three) times daily before meals. 07/03/18  Yes Alla Feeling, NP  potassium chloride SA (K-DUR,KLOR-CON) 20 MEQ tablet Take 1 tablet (20 mEq total) by mouth 2 (two) times daily. Take 4 tablets (20 meq) today, then take one tablet twice daily. Patient taking differently: Take 20 mEq by  mouth daily.  09/20/18  Yes Truitt Merle, MD  capecitabine (XELODA) 500 MG tablet Take 2 tablets (1029m) by mouth in AM & 1 tab (5050m in PM, immediately after food. Take for 14 days on, 7 days off, repeated every 21 d Patient not taking: Reported on 09/23/2018 07/29/18   FeTruitt MerleMD  prochlorperazine (COMPAZINE) 10 MG tablet Take 1 tablet (10 mg total) by mouth every 6 (six) hours as needed (Nausea or vomiting). 07/23/17 02/04/18  FeTruitt MerleMD    Physical Exam:  Vitals:   09/23/18 1619 09/23/18 1730  BP: 97/63 (!) 92/59  Pulse: 72 66  Resp: 17 18  Temp:    SpO2: 100% 100%     Cachectic African-American female looking ill  Icteric no pallor  Sunken cheeks  Throat clear  S1-S2 no murmur tachycardic  Abdomen scaphoid with swollen liver and mass in right upper quadrant  No asterixis  Power 5/5 able to raise limbs  I have personally reviewed following labs and imaging studies  Labs:   Hemoglobin 7.0 down from 9.6 previously alk phos 1382  BUN/creatinine elevated above baseline 16/0 0.9-20 8/0.9  White count 10  Bili level 9.8  Imaging studies:   None  Medical tests:   EKG independently reviewed: No  Test discussed with performing physician:  No  Decision to obtain old records:   No  Review and summation of old records:   Now  Active Problems:   Anemia   Assessment/Plan Metastatic pancreatic cancer with probable obstructive component and bile ducts We will ask Dr. faAnnamaria Bootso consolidate goals of care-we will ask for her to be seen tomorrow by them Not a candidate for any chemo at this stage GI can be consulted non-emergently I have asked family to discuss carefully between the mass patient herself says she does not want anything more done and "I just want to go home" would strongly recommend clarification of goals prior to other procedures Symptomatic anemia transfused 2 units PRBC at this time repeat labs in a.m. Situational hypotension and anorexia  malnutrition with BMI less than 12 Very poor candidate for any aggressive management from my perspective    Severity of Illness: The appropriate patient status for this patient is OBSERVATION. Observation status is judged to be reasonable and necessary in order to provide the required intensity of service to ensure the patient's safety. The patient's presenting symptoms, physical exam findings, and initial radiographic and laboratory data in the context of their medical condition is felt to place them at decreased risk for further clinical deterioration. Furthermore, it is anticipated that the patient will be medically stable for discharge from the hospital within 2 midnights of admission. The following factors support the patient status of observation.   " The patient's  presenting symptoms include anemia. " The physical exam findings include weakness. " The initial radiographic and laboratory data are concerning but somewhat reassuring.      SCD DNR observation MedSurg Time spent: 65 minutes  [name], MD  [facility] Direct contact: [phone] --Via amion app OR  --[email or web address]; password [email or web address]  7PM-7AM contact night coverage as above  09/23/2018, 6:04 PM

## 2018-09-23 NOTE — ED Notes (Signed)
ED TO INPATIENT HANDOFF REPORT  Name/Age/Gender Tammy Adams 68 y.o. female  Code Status   Home/SNF/Other Home  Chief Complaint Oncology Pt./Weakness/Referred by Dr.   Deirdre Pippins of Care/Admitting Diagnosis ED Disposition    ED Disposition Condition [facility] Area: [place] [100102]  Level of Care: Med-Surg [16]  Diagnosis: Anemia [426834]  Admitting Physician: Nita Sells [phone]  Attending Physician: Nita Sells [[id number]]  PT Class (Do Not Modify): Observation [104]  PT Acc Code (Do Not Modify): Observation [10022]       Medical History Past Medical History:  Diagnosis Date  . Anxiety   . Family history of colon cancer   . Family history of pancreatic cancer   . Family history of prostate cancer   . pancreatic ca dx'd 06/2017    Allergies No Known Allergies  IV Location/Drains/Wounds Patient Lines/Drains/Airways Status   Active Line/Drains/Airways    Name:   Placement date:   Placement time:   Site:   Days:   Implanted Port 07/24/17 Right Chest   07/24/17    1538    Chest   426          Labs/Imaging Results for orders placed or performed in visit on 09/23/18 (from the past 48 hour(s))  CMP ([facility] only)     Status: Abnormal   Collection Time: 09/23/18  8:39 AM  Result Value Ref Range   Sodium 144 135 - 145 mmol/L   Potassium 3.5 3.5 - 5.1 mmol/L   Chloride 119 (H) 98 - 111 mmol/L   CO2 19 (L) 22 - 32 mmol/L   Glucose, Bld 79 70 - 99 mg/dL   BUN 28 (H) 8 - 23 mg/dL   Creatinine 0.93 0.44 - 1.00 mg/dL   Calcium 8.4 (L) 8.9 - 10.3 mg/dL   Total Protein 6.6 6.5 - 8.1 g/dL   Albumin 2.2 (L) 3.5 - 5.0 g/dL   AST 85 (H) 15 - 41 U/L   ALT 78 (H) 0 - 44 U/L   Alkaline Phosphatase 1,382 (H) 38 - 126 U/L   Total Bilirubin 9.8 (HH) 0.3 - 1.2 mg/dL    Comment: CRITICAL RESULT CALLED TO, READ BACK BY AND VERIFIED WITH: RN SHERIL ANDRESON    GFR, Est Non Af Am >60 >60 mL/min   GFR, Est AFR Am >60 >60  mL/min   Anion gap 6 5 - 15    Comment: Performed at [facility], [address], [place], [place] [address]  CBC with Differential ([facility] Only)     Status: Abnormal   Collection Time: 09/23/18  8:39 AM  Result Value Ref Range   WBC Count 10.1 4.0 - 10.5 K/uL   RBC 1.92 (L) 3.87 - 5.11 MIL/uL   Hemoglobin 7.0 (L) 12.0 - 15.0 g/dL   HCT 19.8 (L) 36.0 - 46.0 %   MCV 103.1 (H) 80.0 - 100.0 fL   MCH 36.5 (H) 26.0 - 34.0 pg   MCHC 35.4 30.0 - 36.0 g/dL   RDW 23.6 (H) 11.5 - 15.5 %   Platelet Count 192 150 - 400 K/uL   nRBC 0.0 0.0 - 0.2 %   Neutrophils Relative % 90 %   Neutro Abs 9.0 (H) 1.7 - 7.7 K/uL   Lymphocytes Relative 6 %   Lymphs Abs 0.6 (L) 0.7 - 4.0 K/uL   Monocytes Relative 4 %   Monocytes Absolute 0.4 0.1 - 1.0 K/uL   Eosinophils Relative 0 %  Eosinophils Absolute 0.0 0.0 - 0.5 K/uL   Basophils Relative 0 %   Basophils Absolute 0.0 0.0 - 0.1 K/uL   Immature Granulocytes 0 %   Abs Immature Granulocytes 0.04 0.00 - 0.07 K/uL    Comment: Performed at [facility], [place] [address], [place], [place] [address]   No results found.  Pending Labs Unresulted Labs (From admission, onward)    Start     Ordered   09/23/18 1645  Type and screen [place]  Once,   STAT    Comments:  [place]    09/23/18 1645   09/23/18 1645  Prepare RBC  (Adult Blood Administration - PRBC)  Once,   R    Question Answer Comment  # of Units 2 units   Transfusion Indications Symptomatic Anemia   If emergent release call blood bank Not emergent release      09/23/18 1645          Vitals/Pain Today's Vitals   09/23/18 1527 09/23/18 1619 09/23/18 1730 09/23/18 1800  BP:  97/63 (!) 92/59 (!) 96/59  Pulse:  72 66 68  Resp:  17 18   Temp:      TempSrc:      SpO2:  100% 100% 99%  PainSc: 0-No pain 0-No pain      Isolation Precautions No active isolations  Medications Medications   0.9 %  sodium chloride infusion ( Intravenous Transfusing/Transfer 09/23/18 1814)  0.9 %  sodium chloride infusion (0 mL/hr Intravenous Stopped 09/23/18 1800)  sodium chloride 0.9 % bolus 500 mL (0 mLs Intravenous Stopped 09/23/18 1814)    Mobility walks

## 2018-09-24 ENCOUNTER — Other Ambulatory Visit: Payer: Self-pay

## 2018-09-24 ENCOUNTER — Other Ambulatory Visit: Payer: Medicare Other

## 2018-09-24 ENCOUNTER — Observation Stay (HOSPITAL_COMMUNITY): Payer: Medicare Other

## 2018-09-24 DIAGNOSIS — Z515 Encounter for palliative care: Secondary | ICD-10-CM | POA: Diagnosis present

## 2018-09-24 DIAGNOSIS — Z9221 Personal history of antineoplastic chemotherapy: Secondary | ICD-10-CM | POA: Diagnosis not present

## 2018-09-24 DIAGNOSIS — Z800 Family history of malignant neoplasm of digestive organs: Secondary | ICD-10-CM | POA: Diagnosis not present

## 2018-09-24 DIAGNOSIS — C259 Malignant neoplasm of pancreas, unspecified: Secondary | ICD-10-CM

## 2018-09-24 DIAGNOSIS — R531 Weakness: Secondary | ICD-10-CM | POA: Diagnosis not present

## 2018-09-24 DIAGNOSIS — D508 Other iron deficiency anemias: Secondary | ICD-10-CM | POA: Diagnosis not present

## 2018-09-24 DIAGNOSIS — Z66 Do not resuscitate: Secondary | ICD-10-CM | POA: Diagnosis present

## 2018-09-24 DIAGNOSIS — I959 Hypotension, unspecified: Secondary | ICD-10-CM | POA: Diagnosis present

## 2018-09-24 DIAGNOSIS — R630 Anorexia: Secondary | ICD-10-CM | POA: Diagnosis not present

## 2018-09-24 DIAGNOSIS — D649 Anemia, unspecified: Secondary | ICD-10-CM | POA: Diagnosis not present

## 2018-09-24 DIAGNOSIS — Z7189 Other specified counseling: Secondary | ICD-10-CM | POA: Diagnosis not present

## 2018-09-24 DIAGNOSIS — E876 Hypokalemia: Secondary | ICD-10-CM | POA: Diagnosis present

## 2018-09-24 DIAGNOSIS — Z681 Body mass index (BMI) 19 or less, adult: Secondary | ICD-10-CM | POA: Diagnosis not present

## 2018-09-24 DIAGNOSIS — Z539 Procedure and treatment not carried out, unspecified reason: Secondary | ICD-10-CM | POA: Diagnosis present

## 2018-09-24 DIAGNOSIS — R64 Cachexia: Secondary | ICD-10-CM | POA: Diagnosis not present

## 2018-09-24 DIAGNOSIS — M2550 Pain in unspecified joint: Secondary | ICD-10-CM | POA: Diagnosis not present

## 2018-09-24 DIAGNOSIS — E43 Unspecified severe protein-calorie malnutrition: Secondary | ICD-10-CM | POA: Diagnosis present

## 2018-09-24 DIAGNOSIS — Z7401 Bed confinement status: Secondary | ICD-10-CM | POA: Diagnosis not present

## 2018-09-24 DIAGNOSIS — F419 Anxiety disorder, unspecified: Secondary | ICD-10-CM | POA: Diagnosis present

## 2018-09-24 DIAGNOSIS — R945 Abnormal results of liver function studies: Secondary | ICD-10-CM | POA: Diagnosis not present

## 2018-09-24 DIAGNOSIS — F17210 Nicotine dependence, cigarettes, uncomplicated: Secondary | ICD-10-CM | POA: Diagnosis present

## 2018-09-24 DIAGNOSIS — C787 Secondary malignant neoplasm of liver and intrahepatic bile duct: Principal | ICD-10-CM

## 2018-09-24 DIAGNOSIS — N19 Unspecified kidney failure: Secondary | ICD-10-CM | POA: Diagnosis not present

## 2018-09-24 DIAGNOSIS — D630 Anemia in neoplastic disease: Secondary | ICD-10-CM | POA: Diagnosis present

## 2018-09-24 DIAGNOSIS — R627 Adult failure to thrive: Secondary | ICD-10-CM | POA: Diagnosis present

## 2018-09-24 DIAGNOSIS — D638 Anemia in other chronic diseases classified elsewhere: Secondary | ICD-10-CM | POA: Diagnosis not present

## 2018-09-24 DIAGNOSIS — Z801 Family history of malignant neoplasm of trachea, bronchus and lung: Secondary | ICD-10-CM | POA: Diagnosis not present

## 2018-09-24 DIAGNOSIS — K831 Obstruction of bile duct: Secondary | ICD-10-CM | POA: Diagnosis present

## 2018-09-24 LAB — COMPREHENSIVE METABOLIC PANEL
ALT: 71 U/L — AB (ref 0–44)
AST: 82 U/L — ABNORMAL HIGH (ref 15–41)
Albumin: 2.2 g/dL — ABNORMAL LOW (ref 3.5–5.0)
Alkaline Phosphatase: 1082 U/L — ABNORMAL HIGH (ref 38–126)
Anion gap: 6 (ref 5–15)
BUN: 34 mg/dL — ABNORMAL HIGH (ref 8–23)
CO2: 20 mmol/L — ABNORMAL LOW (ref 22–32)
Calcium: 7.8 mg/dL — ABNORMAL LOW (ref 8.9–10.3)
Chloride: 116 mmol/L — ABNORMAL HIGH (ref 98–111)
Creatinine, Ser: 0.75 mg/dL (ref 0.44–1.00)
GFR calc Af Amer: >60 mL/min (ref >60)
GFR calc non Af Amer: >60 mL/min (ref >60)
GLUCOSE: 80 mg/dL (ref 70–99)
Potassium: 3.2 mmol/L — ABNORMAL LOW (ref 3.5–5.1)
Sodium: 142 mmol/L (ref 135–145)
Total Bilirubin: 8.6 mg/dL — ABNORMAL HIGH (ref 0.3–1.2)
Total Protein: 6.4 g/dL — ABNORMAL LOW (ref 6.5–8.1)

## 2018-09-24 LAB — BPAM RBC
Blood Product Expiration Date: 202002262359
Blood Product Expiration Date: 202002262359
ISSUE DATE / TIME: 202002032000
ISSUE DATE / TIME: 202002032347
Unit Type and Rh: 6200
Unit Type and Rh: 6200

## 2018-09-24 LAB — TYPE AND SCREEN
ABO/RH(D): AB POS
ANTIBODY SCREEN: NEGATIVE
Unit division: 0
Unit division: 0

## 2018-09-24 LAB — CBC
HCT: 30.9 % — ABNORMAL LOW (ref 36.0–46.0)
Hemoglobin: 10.5 g/dL — ABNORMAL LOW (ref 12.0–15.0)
MCH: 32.2 pg (ref 26.0–34.0)
MCHC: 34 g/dL (ref 30.0–36.0)
MCV: 94.8 fL (ref 80.0–100.0)
Platelets: 155 10*3/uL (ref 150–400)
RBC: 3.26 MIL/uL — AB (ref 3.87–5.11)
RDW: 20.7 % — ABNORMAL HIGH (ref 11.5–15.5)
WBC: 7.6 10*3/uL (ref 4.0–10.5)
nRBC: 0 % (ref 0.0–0.2)

## 2018-09-24 LAB — ABO/RH: ABO/RH(D): AB POS

## 2018-09-24 MED ORDER — LORAZEPAM 1 MG PO TABS
1.0000 mg | ORAL_TABLET | Freq: Every day | ORAL | Status: DC
  Administered 2018-09-24 – 2018-09-27 (×4): 1 mg via ORAL
  Filled 2018-09-24 (×4): qty 1

## 2018-09-24 MED ORDER — POTASSIUM CHLORIDE 2 MEQ/ML IV SOLN
INTRAVENOUS | Status: DC
  Administered 2018-09-24: 13:00:00 via INTRAVENOUS
  Filled 2018-09-24 (×3): qty 1000

## 2018-09-24 NOTE — Progress Notes (Signed)
[facility] PROGRESS NOTE  Tammy Adams VQM:[id number] DOB: November 09, 1950 DOA: 09/23/2018 PCP: Wilford Corner, MD   Narrative: at 09/23/2018 6:04 PM .   HPI  Tammy Adams DTO:[id number] DOB: 08-22-1950 DOA: 09/23/2018  PCP: Wilford Corner, MD  In [facility] Chief Complaint:  Weakness dizziness    HPI:  68 year old African-American female Known history of metastatic pancreatic cancer diagnosed 2018 with mets to liver clinical stage IV cTX cNX pM1-metastatic adenocarcinoma status post gemcitabine Abraxane stopped 01/22/2018 due to disease progression-Trial a second line therapy and third line as well Still smoker  She has been dizzy and weak Recent MRI 09/17/18 showed abd mass ~ 10cm now in size her case was discussed with gastroenterology and she was becoming symptomatic so they were thinking of placing a stent and Dr. Winona Legato was considering this Continued on this Xeloda was going to be tried  At the 1/31 visit she did not want to be transfused--she opted to proceed with stent placement and at that time she was recommended to be DNR DNI   A & Plan anemia likely related to malignancy  No reports of bleeding-tol diet.   transfused x 2 this admit.  recheck  labs am aki  Mild increase bun--start saline 50 cc/h.  Get urine sodium and urine creast--would also ge tRenal [place] as patient does have abd mass that could impinge on ureters Cystic mass in pancreas Unclear plan--might need in house work up per GI if felt necessary to be done as per Oncology--await input from Oncologist Stage iv pancreatic cancer failing 3 lines chemo  defe rto oncology Severe malnutirtion  2/2 cancer      [name], MD  [facility] [email or web address] 7PM-7AM contact night coverage as above 09/24/2018, 10:37 AM  LOS: 0 days   Consultants:  oncology  Procedures:    Antimicrobials:    Interval history/Subjective: Awake alert pleasant feels much  stornger Nurse reports eatign well drinking boost No pain no fever no sob   Objective:  Vitals:  Vitals:   09/24/18 0656 09/24/18 0658  BP: 95/64 98/69  Pulse: 63 62  Resp:    Temp:    SpO2:      Exam:   eomi ncat frail bitemporal wasting No pallor--some ict cta b abd slight distension, mass felt No le edema Neuro intac tno focal deficit   I have personally reviewed the following:  DATA   Labs:  Bun/creat up from 28/0.9--->34/0.7, baseline 2 days prior 16/0.9  Imaging studies:  None ye Scheduled Meds: . sodium chloride   Intravenous Once  . feeding supplement (ENSURE ENLIVE)  237 mL Oral BID BM  . furosemide  20 mg Intravenous Once  . lipase/protease/amylase  36,000 Units Oral TID AC  . mirtazapine  15 mg Oral QHS   Continuous Infusions: . sodium chloride 75 mL/hr at 09/23/18 1801    Active Problems:   Anemia   LOS: 0 days

## 2018-09-25 ENCOUNTER — Other Ambulatory Visit: Payer: Medicare Other

## 2018-09-25 ENCOUNTER — Ambulatory Visit: Payer: Medicare Other | Admitting: Hematology

## 2018-09-25 ENCOUNTER — Inpatient Hospital Stay: Payer: Medicare Other

## 2018-09-25 DIAGNOSIS — E43 Unspecified severe protein-calorie malnutrition: Secondary | ICD-10-CM

## 2018-09-25 DIAGNOSIS — D508 Other iron deficiency anemias: Secondary | ICD-10-CM

## 2018-09-25 DIAGNOSIS — R64 Cachexia: Secondary | ICD-10-CM

## 2018-09-25 DIAGNOSIS — C259 Malignant neoplasm of pancreas, unspecified: Principal | ICD-10-CM

## 2018-09-25 DIAGNOSIS — C787 Secondary malignant neoplasm of liver and intrahepatic bile duct: Secondary | ICD-10-CM

## 2018-09-25 DIAGNOSIS — R945 Abnormal results of liver function studies: Secondary | ICD-10-CM

## 2018-09-25 DIAGNOSIS — K831 Obstruction of bile duct: Secondary | ICD-10-CM

## 2018-09-25 DIAGNOSIS — D638 Anemia in other chronic diseases classified elsewhere: Secondary | ICD-10-CM

## 2018-09-25 LAB — PROTIME-INR
INR: 2.37
Prothrombin Time: 25.5 seconds — ABNORMAL HIGH (ref 11.4–15.2)

## 2018-09-25 MED ORDER — MAGNESIUM OXIDE 400 (241.3 MG) MG PO TABS
800.0000 mg | ORAL_TABLET | Freq: Once | ORAL | Status: AC
  Administered 2018-09-25: 800 mg via ORAL
  Filled 2018-09-25: qty 2

## 2018-09-25 MED ORDER — VITAMIN K1 10 MG/ML IJ SOLN
10.0000 mg | Freq: Every day | INTRAMUSCULAR | Status: AC
  Administered 2018-09-25 – 2018-09-26 (×2): 10 mg via SUBCUTANEOUS
  Filled 2018-09-25 (×3): qty 1

## 2018-09-25 MED ORDER — SODIUM CHLORIDE 0.9% IV SOLUTION
Freq: Once | INTRAVENOUS | Status: AC
  Administered 2018-09-25: 15:00:00 via INTRAVENOUS

## 2018-09-25 MED ORDER — LACTATED RINGERS IV SOLN
INTRAVENOUS | Status: AC
  Administered 2018-09-25 (×2): via INTRAVENOUS

## 2018-09-25 MED ORDER — BOOST PLUS PO LIQD
237.0000 mL | Freq: Three times a day (TID) | ORAL | Status: DC
  Administered 2018-09-25 – 2018-09-28 (×7): 237 mL via ORAL
  Filled 2018-09-25 (×13): qty 237

## 2018-09-25 MED ORDER — POTASSIUM CHLORIDE CRYS ER 20 MEQ PO TBCR
40.0000 meq | EXTENDED_RELEASE_TABLET | Freq: Once | ORAL | Status: AC
  Administered 2018-09-25: 40 meq via ORAL
  Filled 2018-09-25: qty 2

## 2018-09-25 NOTE — Consult Note (Addendum)
[facility] Consult Note   History Tammy Adams MRN # [id number]  Date of Admission: 09/23/2018 Date of Consultation: 09/25/2018 Referring physician: Dr. Reesa Chew, Jeanella Flattery, MD Primary Care Provider: Via, Lennette Bihari, MD Primary Gastroenterologist: Dr. Owens Loffler   Reason for Consultation/Chief Complaint: Obstructive jaundice from pancreatic cancer  Subjective  HPI:  This is a very pleasant 68 year old woman with extensively metastatic pancreatic cancer under the care of Dr. Burr Medico.  The patient has progressive disease despite chemotherapy regimens.  She was under the care of [facility], and oncology consulted him as an outpatient recently when the patient had worsening obstructive jaundice from a large pancreatic head mass.  She also has innumerable metastatic liver lesions.  Dr. Paulita Fujita did not feel that ERCP would be feasible.  Dr. Burr Medico then discussed the case with Drs. Ardis Hughs and [place], who agreed to make an attempt at ERCP, with the case currently scheduled as an outpatient tomorrow at 1 PM.  However, yesterday the patient felt progressively weak came to the ED, where she was found to be more anemic than usual and has now received 2 units of PRBCs.  Feels better afterwards, but is still globally weak with a poor appetite and ongoing severe weight loss.  She denies dysphagia or odynophagia.  She has not had vomiting, but appetite is poor.  She has upper abdominal dull fairly constant discomfort and fullness.  ROS:  Constitutional: Weight loss, no fever Eyes:   Icterus ENT:   None Cardiovascular:   No chest pain Respiratory:   No dyspnea, but does very little activity due to generalized fatigue and weakness Musculoskeletal:   None Urinary:   Urinary retention Vascular:   None Neuro:   No focal weakness Skin:   None Heme/Lymph:   No swelling or adenopathy Psychiatric:   None  All other systems are negative except as noted above in the HPI  Past  Medical History Past Medical History:  Diagnosis Date  . Anxiety   . Family history of colon cancer   . Family history of pancreatic cancer   . Family history of prostate cancer   . pancreatic ca dx'd 06/2017    Past Surgical History Past Surgical History:  Procedure Laterality Date  . IR FLUORO GUIDE PORT INSERTION RIGHT  07/24/2017  . IR US GUIDE VASC ACCESS RIGHT  07/24/2017    Family History Family History  Problem Relation Age of Onset  . Prostate cancer Father        d. 25  . Pancreatic cancer Maternal Aunt 70  . Colon cancer Maternal Grandmother        dx in her 63s  . Prostate cancer Maternal Uncle   . Lung cancer Cousin   . Cancer Cousin        unknown type  . Cancer Cousin 50       daughter of cousin with lung cancer    Social History Social History   Socioeconomic History  . Marital status: Married    Spouse name: Not on file  . Number of children: Not on file  . Years of education: Not on file  . Highest education level: Not on file  Occupational History  . Not on file  Social Needs  . Financial resource strain: Not on file  . Food insecurity:    Worry: Not on file    Inability: Not on file  . Transportation needs:    Medical: Not on file    Non-medical:  Not on file  Tobacco Use  . Smoking status: Current Every Day Smoker    Packs/day: 0.50    Years: 50.00    Pack years: 25.00  . Smokeless tobacco: Never Used  Substance and Sexual Activity  . Alcohol use: No    Frequency: Never  . Drug use: Yes    Types: Marijuana  . Sexual activity: Not on file  Lifestyle  . Physical activity:    Days per week: Not on file    Minutes per session: Not on file  . Stress: Not on file  Relationships  . Social connections:    Talks on phone: Not on file    Gets together: Not on file    Attends religious service: Not on file    Active member of club or organization: Not on file    Attends meetings of clubs or organizations: Not on file    Relationship  status: Not on file  Other Topics Concern  . Not on file  Social History Narrative  . Not on file   She lives at home with her husband, who is on dialysis  Allergies No Known Allergies  Outpatient Meds Home medications from the H+P and/or nursing med reconciliation reviewed.  Inpatient med list reviewed  _____________________________________________________________________ Objective   Exam:  Current vital signs  Patient Vitals for the past 8 hrs:  BP Temp Temp src Pulse Resp SpO2  09/25/18 0530 97/69 98.6 F (37 C) Oral 68 15 100 %    Intake/Output Summary (Last 24 hours) at 09/25/2018 0940 Last data filed at 09/25/2018 0200 Gross per 24 hour  Intake 2941.44 ml  Output -  Net 2941.44 ml    Physical Exam:    General: this is a frail, cachectic patient in no acute distress.  She is generally weak, but awake and conversational with normal mental status.  She is able to sit up in bed and eat breakfast by herself.  Eyes: sclera icteric, no redness  ENT: oral mucosa moist without lesions, no cervical or supraclavicular lymphadenopathy, false dentition good repair  CV: RRR without murmur, S1/S2, no JVD,, no peripheral edema  Resp: clear to auscultation bilaterally, normal RR and effort noted  GI: soft, no tenderness, with active bowel sounds.  She has massive firm hepatomegaly.  No bulging flanks distention or obvious ascites.  Skin; warm and dry, no rash black skin no appreciable jaundice  Neuro: awake, alert and oriented x 3. Normal gross motor function and fluent speech.  Labs:  Recent Labs  Lab 09/20/18 1029 09/23/18 0839 09/24/18 0610  WBC 9.6 10.1 7.6  HGB 7.2* 7.0* 10.5*  HCT 20.4* 19.8* 30.9*  PLT 190 192 155   Recent Labs  Lab 09/24/18 0610  NA 142  K 3.2*  CL 116*  CO2 20*  BUN 34*  ALBUMIN 2.2*  ALKPHOS 1,082*  ALT 71*  AST 82*  GLUCOSE 80   No results for input(s): INR in the last 168 hours. Last INR was in December  2018  Radiologic studies:  MRI abdomen shows large pancreatic lesion with extrahepatic biliary ductal dilatation and innumerable liver metastasis. Report in chart, and I personally reviewed the images.  @ASSESSMENTPLANBEGIN @ Impression:  Advanced, metastatic pancreatic cancer no longer responding to chemotherapy, now with worsening extrahepatic biliary obstruction based on imaging. Cachexia advanced malignancy Anemia of chronic disease and malignancy.  It was acutely worse and now improved after 2 units PRBCs last evening. Elevated LFTs from obstructive jaundice and pancreatic cancer Massive  hepatomegaly from tumor metastasis.  Plan:  Dr. Ardis Hughs will attempt an ERCP to access the bile duct and place a metal biliary stent if technically feasible. Full liquid diet starting at supper tonight, then n.p.o. after midnight to ensure complete gastric emptying prior to procedure. ERCP was described in detail including risks and benefits of the procedure.  She is agreeable.  Dr. Ardis Hughs will review these with her tomorrow since he will be the physician performing the procedure.  The benefits and risks of the planned procedure were described in detail with the patient or (when appropriate) their health care proxy.  Risks were outlined as including, but not limited to, bleeding, infection, perforation, adverse medication reaction leading to cardiac or pulmonary decompensation, or pancreatitis (if ERCP).  The limitation of incomplete mucosal visualization was also discussed.  No guarantees or warranties were given. Patient at increased risk for cardiopulmonary complications of procedure due to medical comorbidities. DNR status will have to be reversed for the procedure.  PT/INR today to see if there is any coagulopathy from the significant metastatic hepatic tumor burden.  No subQ heparin or Lovenox.  SCDs for DVT prophylaxis.   Thank you for the courtesy of this consult.  Please contact me with any  questions or concerns.  Nelida Meuse III Office: [phone]

## 2018-09-25 NOTE — Progress Notes (Signed)
PROGRESS NOTE    Tammy Adams  JEH:[id number] DOB: Sep 18, 1950 DOA: 09/23/2018 PCP: Dineen Kid, MD   Brief Narrative:  68 year old with a history of extensively metastatic pancreatic cancer stage IV came to the hospital for evaluation of progressive weakness and dizziness.  Recent MRI had shown increase in size of abdominal mass outpatient and case was discussed with gastroenterology and there were initial plans to get outpatient stent placement/ERCP but in the meantime patient worsened therefore came to the hospital.   Assessment & Plan:   Principal Problem:   Pancreatic cancer metastasized to liver [facility]) Active Problems:   Anemia  Stage IV metastatic pancreatic adenocarcinoma with obstructive jaundice -Chemotherapy plans per oncology. -ERCP planned for tomorrow for possible biliary stent placement -Liquid diet today, n.p.o. past midnight. -Supportive care.  Monitor LFTs. -Patient would benefit from hospice care.  She has poor prognosis given her advanced malignancy, advanced age and poor nutritional status.  Anorexia with failure to thrive Severe protein calorie malnutrition -Encourage oral diet. Nutrition supplements added   DVT prophylaxis: SCDs Code Status: DNR Family Communication: None at bedside Disposition Plan: Maintain inpatient stay until the ERCP  Consultants:   Gastroenterology  Oncology  Procedures:   None  Antimicrobials:   None   Subjective: Patient does not have any new complaints this morning.  See sitting up in the bed eating her breakfast.  Review of Systems Otherwise negative except as per HPI, including: General: Denies fever, chills, night sweats or unintended weight loss. Resp: Denies cough, wheezing, shortness of breath. Cardiac: Denies chest pain, palpitations, orthopnea, paroxysmal nocturnal dyspnea. GI: Denies abdominal pain, nausea, vomiting, diarrhea or constipation GU: Denies dysuria, frequency, hesitancy or incontinence MS:  Denies muscle aches, joint pain or swelling Neuro: Denies headache, neurologic deficits (focal weakness, numbness, tingling), abnormal gait Psych: Denies anxiety, depression, SI/HI/AVH Skin: Denies new rashes or lesions ID: Denies sick contacts, exotic exposures, travel  Objective: Vitals:   09/24/18 0658 09/24/18 1255 09/24/18 2028 09/25/18 0530  BP: 98/69 106/70 97/64 97/69   Pulse: 62 (!) 56 61 68  Resp:  16 17 15   Temp:  98.3 F (36.8 C) 98.9 F (37.2 C) 98.6 F (37 C)  TempSrc:  Oral Oral Oral  SpO2:  100% 100% 100%  Weight:      Height:        Intake/Output Summary (Last 24 hours) at 09/25/2018 1213 Last data filed at 09/25/2018 1036 Gross per 24 hour  Intake 3061.44 ml  Output -  Net 3061.44 ml   Filed Weights   09/23/18 1834  Weight: 37.4 kg    Examination:  General exam: Appears elderly, cachectic, frail with bilateral temporal wasting Respiratory system: Clear to auscultation. Respiratory effort normal. Cardiovascular system: S1 & S2 heard, RRR. No JVD, murmurs, rubs, gallops or clicks. No pedal edema. Gastrointestinal system: Abdomen is nondistended, soft and nontender. No organomegaly or masses felt. Normal bowel sounds heard. Central nervous system: Alert and oriented. No focal neurological deficits. Extremities: Symmetric 4 x 5 power. Skin: No rashes, lesions or ulcers Psychiatry: Judgement and insight appear normal. Mood & affect appropriate.     Data Reviewed:   CBC: Recent Labs  Lab 09/20/18 1029 09/23/18 0839 09/24/18 0610  WBC 9.6 10.1 7.6  NEUTROABS 8.5* 9.0*  --   HGB 7.2* 7.0* 10.5*  HCT 20.4* 19.8* 30.9*  MCV 102.0* 103.1* 94.8  PLT 190 192 378   Basic Metabolic Panel: Recent Labs  Lab 09/20/18 1029 09/23/18 0839 09/24/18 0610  NA 143 144  142  K 2.6* 3.5 3.2*  CL 114* 119* 116*  CO2 19* 19* 20*  GLUCOSE 134* 79 80  BUN 16 28* 34*  CREATININE 0.92 0.93 0.75  CALCIUM 8.6* 8.4* 7.8*   GFR: Estimated Creatinine Clearance:  39.2 mL/min (by C-G formula based on SCr of 0.75 mg/dL). Liver Function Tests: Recent Labs  Lab 09/20/18 1029 09/23/18 0839 09/24/18 0610  AST 86* 85* 82*  ALT 79* 78* 71*  ALKPHOS 1,566* 1,382* 1,082*  BILITOT 11.2* 9.8* 8.6*  PROT 6.8 6.6 6.4*  ALBUMIN 2.3* 2.2* 2.2*   No results for input(s): LIPASE, AMYLASE in the last 168 hours. No results for input(s): AMMONIA in the last 168 hours. Coagulation Profile: Recent Labs  Lab 09/25/18 1039  INR 2.37   Cardiac Enzymes: No results for input(s): CKTOTAL, CKMB, CKMBINDEX, TROPONINI in the last 168 hours. BNP (last 3 results) No results for input(s): PROBNP in the last 8760 hours. HbA1C: No results for input(s): HGBA1C in the last 72 hours. CBG: No results for input(s): GLUCAP in the last 168 hours. Lipid Profile: No results for input(s): CHOL, HDL, LDLCALC, TRIG, CHOLHDL, LDLDIRECT in the last 72 hours. Thyroid Function Tests: No results for input(s): TSH, T4TOTAL, FREET4, T3FREE, THYROIDAB in the last 72 hours. Anemia Panel: No results for input(s): VITAMINB12, FOLATE, FERRITIN, TIBC, IRON, RETICCTPCT in the last 72 hours. Sepsis Labs: No results for input(s): PROCALCITON, LATICACIDVEN in the last 168 hours.  No results found for this or any previous visit (from the past 240 hour(s)).       Radiology Studies: US Renal  Result Date: 09/24/2018 CLINICAL DATA:  Renal failure. EXAM: RENAL / URINARY TRACT ULTRASOUND COMPLETE COMPARISON:  MRI of 09/17/2018. FINDINGS: Right Kidney: Renal measurements: 10.14.0 x 5.4 cm = volume: 1011.8 mL. Increased echogenicity. 2.3 cm simple cyst. 1.8 cm simple cyst. No hydronephrosis visualized. Left Kidney: Renal measurements: 9.5 x 4.5 x 5.1 cm = volume: 1012.4 mL. Increased echogenicity. 20.7 cm simple cyst. No hydronephrosis. Visualized. Bladder: Small amount of debris noted the bladder. Bladder wall thickening can not be excluded. Bladder pathology can not be excluded. Mild ascites noted.  IMPRESSION: 1. Increased renal echogenicity consistent with chronic medical renal disease. Simple bilateral renal cysts noted as above. 2. Small amount of debris noted the bladder. Bladder wall thickening can not be excluded. Better pathology can not be excluded. 3.  Mild ascites. Electronically Signed   By: Marcello Moores  Register   On: 09/24/2018 12:47        Scheduled Meds: . sodium chloride   Intravenous Once  . furosemide  20 mg Intravenous Once  . lactose free nutrition  237 mL Oral TID WC  . lipase/protease/amylase  36,000 Units Oral TID AC  . LORazepam  1 mg Oral QHS  . mirtazapine  15 mg Oral QHS   Continuous Infusions: . sodium chloride 75 mL/hr at 09/23/18 1801  . lactated ringers 75 mL/hr at 09/25/18 1054     LOS: 1 day   Time spent= 35 mins    Tammy Adams Arsenio Loader, MD [facility]  If 7PM-7AM, please contact night-coverage [email or web address] 09/25/2018, 12:13 PM

## 2018-09-25 NOTE — Progress Notes (Signed)
INR 2.3.  I ordered vitamin K now.  Also FFP 2 units now with repeat INR early tomorrow AM.

## 2018-09-25 NOTE — H&P (View-Only) (Signed)
[facility] Consult Note   History Tammy Adams MRN # [id number]  Date of Admission: 09/23/2018 Date of Consultation: 09/25/2018 Referring physician: Dr. Reesa Chew, Jeanella Flattery, MD Primary Care Provider: Via, Lennette Bihari, MD Primary Gastroenterologist: Dr. Owens Loffler   Reason for Consultation/Chief Complaint: Obstructive jaundice from pancreatic cancer  Subjective  HPI:  This is a very pleasant 68 year old woman with extensively metastatic pancreatic cancer under the care of Dr. Burr Medico.  The patient has progressive disease despite chemotherapy regimens.  She was under the care of [facility], and oncology consulted him as an outpatient recently when the patient had worsening obstructive jaundice from a large pancreatic head mass.  She also has innumerable metastatic liver lesions.  Dr. Paulita Fujita did not feel that ERCP would be feasible.  Dr. Burr Medico then discussed the case with Drs. Ardis Hughs and [place], who agreed to make an attempt at ERCP, with the case currently scheduled as an outpatient tomorrow at 1 PM.  However, yesterday the patient felt progressively weak came to the ED, where she was found to be more anemic than usual and has now received 2 units of PRBCs.  Feels better afterwards, but is still globally weak with a poor appetite and ongoing severe weight loss.  She denies dysphagia or odynophagia.  She has not had vomiting, but appetite is poor.  She has upper abdominal dull fairly constant discomfort and fullness.  ROS:  Constitutional: Weight loss, no fever Eyes:   Icterus ENT:   None Cardiovascular:   No chest pain Respiratory:   No dyspnea, but does very little activity due to generalized fatigue and weakness Musculoskeletal:   None Urinary:   Urinary retention Vascular:   None Neuro:   No focal weakness Skin:   None Heme/Lymph:   No swelling or adenopathy Psychiatric:   None  All other systems are negative except as noted above in the HPI  Past  Medical History Past Medical History:  Diagnosis Date  . Anxiety   . Family history of colon cancer   . Family history of pancreatic cancer   . Family history of prostate cancer   . pancreatic ca dx'd 06/2017    Past Surgical History Past Surgical History:  Procedure Laterality Date  . IR FLUORO GUIDE PORT INSERTION RIGHT  07/24/2017  . IR US GUIDE VASC ACCESS RIGHT  07/24/2017    Family History Family History  Problem Relation Age of Onset  . Prostate cancer Father        d. 23  . Pancreatic cancer Maternal Aunt 70  . Colon cancer Maternal Grandmother        dx in her 1s  . Prostate cancer Maternal Uncle   . Lung cancer Cousin   . Cancer Cousin        unknown type  . Cancer Cousin 41       daughter of cousin with lung cancer    Social History Social History   Socioeconomic History  . Marital status: Married    Spouse name: Not on file  . Number of children: Not on file  . Years of education: Not on file  . Highest education level: Not on file  Occupational History  . Not on file  Social Needs  . Financial resource strain: Not on file  . Food insecurity:    Worry: Not on file    Inability: Not on file  . Transportation needs:    Medical: Not on file    Non-medical:  Not on file  Tobacco Use  . Smoking status: Current Every Day Smoker    Packs/day: 0.50    Years: 50.00    Pack years: 25.00  . Smokeless tobacco: Never Used  Substance and Sexual Activity  . Alcohol use: No    Frequency: Never  . Drug use: Yes    Types: Marijuana  . Sexual activity: Not on file  Lifestyle  . Physical activity:    Days per week: Not on file    Minutes per session: Not on file  . Stress: Not on file  Relationships  . Social connections:    Talks on phone: Not on file    Gets together: Not on file    Attends religious service: Not on file    Active member of club or organization: Not on file    Attends meetings of clubs or organizations: Not on file    Relationship  status: Not on file  Other Topics Concern  . Not on file  Social History Narrative  . Not on file   She lives at home with her husband, who is on dialysis  Allergies No Known Allergies  Outpatient Meds Home medications from the H+P and/or nursing med reconciliation reviewed.  Inpatient med list reviewed  _____________________________________________________________________ Objective   Exam:  Current vital signs  Patient Vitals for the past 8 hrs:  BP Temp Temp src Pulse Resp SpO2  09/25/18 0530 97/69 98.6 F (37 C) Oral 68 15 100 %    Intake/Output Summary (Last 24 hours) at 09/25/2018 0940 Last data filed at 09/25/2018 0200 Gross per 24 hour  Intake 2941.44 ml  Output -  Net 2941.44 ml    Physical Exam:    General: this is a frail, cachectic patient in no acute distress.  She is generally weak, but awake and conversational with normal mental status.  She is able to sit up in bed and eat breakfast by herself.  Eyes: sclera icteric, no redness  ENT: oral mucosa moist without lesions, no cervical or supraclavicular lymphadenopathy, false dentition good repair  CV: RRR without murmur, S1/S2, no JVD,, no peripheral edema  Resp: clear to auscultation bilaterally, normal RR and effort noted  GI: soft, no tenderness, with active bowel sounds.  She has massive firm hepatomegaly.  No bulging flanks distention or obvious ascites.  Skin; warm and dry, no rash black skin no appreciable jaundice  Neuro: awake, alert and oriented x 3. Normal gross motor function and fluent speech.  Labs:  Recent Labs  Lab 09/20/18 1029 09/23/18 0839 09/24/18 0610  WBC 9.6 10.1 7.6  HGB 7.2* 7.0* 10.5*  HCT 20.4* 19.8* 30.9*  PLT 190 192 155   Recent Labs  Lab 09/24/18 0610  NA 142  K 3.2*  CL 116*  CO2 20*  BUN 34*  ALBUMIN 2.2*  ALKPHOS 1,082*  ALT 71*  AST 82*  GLUCOSE 80   No results for input(s): INR in the last 168 hours. Last INR was in December  2018  Radiologic studies:  MRI abdomen shows large pancreatic lesion with extrahepatic biliary ductal dilatation and innumerable liver metastasis. Report in chart, and I personally reviewed the images.  @ASSESSMENTPLANBEGIN @ Impression:  Advanced, metastatic pancreatic cancer no longer responding to chemotherapy, now with worsening extrahepatic biliary obstruction based on imaging. Cachexia advanced malignancy Anemia of chronic disease and malignancy.  It was acutely worse and now improved after 2 units PRBCs last evening. Elevated LFTs from obstructive jaundice and pancreatic cancer Massive  hepatomegaly from tumor metastasis.  Plan:  Dr. Ardis Hughs will attempt an ERCP to access the bile duct and place a metal biliary stent if technically feasible. Full liquid diet starting at supper tonight, then n.p.o. after midnight to ensure complete gastric emptying prior to procedure. ERCP was described in detail including risks and benefits of the procedure.  She is agreeable.  Dr. Ardis Hughs will review these with her tomorrow since he will be the physician performing the procedure.  The benefits and risks of the planned procedure were described in detail with the patient or (when appropriate) their health care proxy.  Risks were outlined as including, but not limited to, bleeding, infection, perforation, adverse medication reaction leading to cardiac or pulmonary decompensation, or pancreatitis (if ERCP).  The limitation of incomplete mucosal visualization was also discussed.  No guarantees or warranties were given. Patient at increased risk for cardiopulmonary complications of procedure due to medical comorbidities. DNR status will have to be reversed for the procedure.  PT/INR today to see if there is any coagulopathy from the significant metastatic hepatic tumor burden.  No subQ heparin or Lovenox.  SCDs for DVT prophylaxis.   Thank you for the courtesy of this consult.  Please contact me with any  questions or concerns.  Nelida Meuse III Office: [phone]

## 2018-09-25 NOTE — Progress Notes (Signed)
Tammy Adams   DOB:[birth year]   WC#:[id number]   [id number]  Oncology follow up   Subjective: pt is well-known to me, was admitted yesterday for worsening fatigue. She received blood transfusion and IVF, feels better today.    Objective:  Vitals:   10/04/2018 2028 09/25/18 0530  BP: 97/64 97/69  Pulse: 61 68  Resp: 17 15  Temp: 98.9 F (37.2 C) 98.6 F (37 C)  SpO2: 100% 100%    Body mass index is 14.15 kg/m.  Intake/Output Summary (Last 24 hours) at 09/25/2018 0721 Last data filed at 09/25/2018 0200 Gross per 24 hour  Intake 3181.44 ml  Output -  Net 3181.44 ml     Sclerae icteric  Oropharynx clear  No peripheral adenopathy  Lungs clear -- no rales or rhonchi  Heart regular rate and rhythm  Abdomen benign  MSK no focal spinal tenderness, no peripheral edema  Neuro nonfocal    CBG (last 3)  No results for input(s): GLUCAP in the last 72 hours.   Labs:  Lab Results  Component Value Date   WBC 7.6 10/04/18   HGB 10.5 (L) 2018-10-04   HCT 30.9 (L) Oct 04, 2018   MCV 94.8 10-04-2018   PLT 155 10-04-18   NEUTROABS 9.0 (H) 09/23/2018    Urine Studies No results for input(s): UHGB, CRYS in the last 72 hours.  Invalid input(s): UACOL, UAPR, USPG, UPH, UTP, UGL, UKET, UBIL, UNIT, UROB, ULEU, UEPI, UWBC, [place], [place], [place], [place], [place]  Basic Metabolic Panel: Recent Labs  Lab 09/20/18 1029 09/23/18 0839 2018-10-04 0610  NA 143 144 142  K 2.6* 3.5 3.2*  CL 114* 119* 116*  CO2 19* 19* 20*  GLUCOSE 134* 79 80  BUN 16 28* 34*  CREATININE 0.92 0.93 0.75  CALCIUM 8.6* 8.4* 7.8*   GFR Estimated Creatinine Clearance: 39.2 mL/min (by C-G formula based on SCr of 0.75 mg/dL). Liver Function Tests: Recent Labs  Lab 09/20/18 1029 09/23/18 0839 04-Oct-2018 0610  AST 86* 85* 82*  ALT 79* 78* 71*  ALKPHOS 1,566* 1,382* 1,082*  BILITOT 11.2* 9.8* 8.6*  PROT 6.8 6.6 6.4*  ALBUMIN 2.3* 2.2* 2.2*   No results for input(s): LIPASE, AMYLASE in the last 168 hours. No  results for input(s): AMMONIA in the last 168 hours. Coagulation profile No results for input(s): INR, PROTIME in the last 168 hours.  CBC: Recent Labs  Lab 09/20/18 1029 09/23/18 0839 10-04-2018 0610  WBC 9.6 10.1 7.6  NEUTROABS 8.5* 9.0*  --   HGB 7.2* 7.0* 10.5*  HCT 20.4* 19.8* 30.9*  MCV 102.0* 103.1* 94.8  PLT 190 192 155   Cardiac Enzymes: No results for input(s): CKTOTAL, CKMB, CKMBINDEX, TROPONINI in the last 168 hours. BNP: Invalid input(s): POCBNP CBG: No results for input(s): GLUCAP in the last 168 hours. D-Dimer No results for input(s): DDIMER in the last 72 hours. Hgb A1c No results for input(s): HGBA1C in the last 72 hours. Lipid Profile No results for input(s): CHOL, HDL, LDLCALC, TRIG, CHOLHDL, LDLDIRECT in the last 72 hours. Thyroid function studies No results for input(s): TSH, T4TOTAL, T3FREE, THYROIDAB in the last 72 hours.  Invalid input(s): FREET3 Anemia work up No results for input(s): VITAMINB12, FOLATE, FERRITIN, TIBC, IRON, RETICCTPCT in the last 72 hours. Microbiology No results found for this or any previous visit (from the past 240 hour(s)).    Studies:  US Renal  Result Date: 2018/10/04 CLINICAL DATA:  Renal failure. EXAM: RENAL / URINARY TRACT ULTRASOUND COMPLETE COMPARISON:  MRI  of 09/17/2018. FINDINGS: Right Kidney: Renal measurements: 10.14.0 x 5.4 cm = volume: 1011.8 mL. Increased echogenicity. 2.3 cm simple cyst. 1.8 cm simple cyst. No hydronephrosis visualized. Left Kidney: Renal measurements: 9.5 x 4.5 x 5.1 cm = volume: 1012.4 mL. Increased echogenicity. 20.7 cm simple cyst. No hydronephrosis. Visualized. Bladder: Small amount of debris noted the bladder. Bladder wall thickening can not be excluded. Bladder pathology can not be excluded. Mild ascites noted. IMPRESSION: 1. Increased renal echogenicity consistent with chronic medical renal disease. Simple bilateral renal cysts noted as above. 2. Small amount of debris noted the bladder.  Bladder wall thickening can not be excluded. Better pathology can not be excluded. 3.  Mild ascites. Electronically Signed   By: Marcello Moores  Register   On: 09/24/2018 12:47    Assessment: 68 y.o. female   1. Anemia, secondary to underline malignancy, no overt bleeding  2. Obstructive jaundice secondary to pancreatic mass  3. Metastatic pancreatic cancer, stage IV, s/p multiple line chemo, off treatment now  4. Anorexia and weight loss  5. DNR/DNI    Plan:  -she clinically improved after blood transfusion and IVF, still very weak, no pain -we again discussed the role of ERCP for her jaundice. She understands it's palliative, to prevent jaundice related pruritis and risk of infection. She agrees to proceed, which is scheduled for this Thursday 2/6 -will keep her in [facility] for procedure on 2/6, I informed GI Dr. Ardis Hughs  -we also discussed discharge plan. Unfortunately she has had multiple line chemo, no more standard therapy available. We discussed palliative home care vs hospice. Pt declined, although her family encouraged her. I will f/u her in my clinic next week. -I also spoke with her brother Dr. Laurann Montana who was medical director for hospice. He is coming to see her this weekend, he is open to hospice and may discuss with pt.   Truitt Merle, MD 09/24/2018

## 2018-09-25 NOTE — Progress Notes (Signed)
Initial Nutrition Assessment  DOCUMENTATION CODES:   Severe malnutrition in context of chronic illness, Underweight  INTERVENTION:   -Provide Boost Plus TID- Each supplement provides 360kcal and 14g protein.   -Provide Magic cup BID with meals, each supplement provides 290 kcal and 9 grams of protein -Provide Multivitamin with minerals daily  NUTRITION DIAGNOSIS:   Severe Malnutrition related to cancer and cancer related treatments, chronic illness, catabolic illness as evidenced by severe fat depletion, severe muscle depletion.  GOAL:   Patient will meet greater than or equal to 90% of their needs  MONITOR:   PO intake, Supplement acceptance, Labs, Weight trends, I & O's  REASON FOR ASSESSMENT:   Malnutrition Screening Tool    ASSESSMENT:   68 year old with a history of extensively metastatic pancreatic cancer stage IV. Admitted for evaluation of progressive weakness and dizziness.    Patient has been eating well, PO 100%. Pt states she has been eating better PTA. Pt now on full liquids and will be NPO after midnight for ERCP tomorrow. Pt denies issues with swallowing or chewing. Pt has been drinking 3 Boosts daily at home. Will order Boost Plus supplements for this admission and pt is amenable to trying Magic Cups as well.   Per weight records, pt's weight has increased since 07/17/18. Still meets criteria for underweight status.   Medications: Creon capsule TID, Remeron tablet daily, K-DUR tablet once, Lactated Ringers infusion at 75 ml/hr Labs reviewed: Low K   NUTRITION - FOCUSED PHYSICAL EXAM:    Most Recent Value  Orbital Region  Moderate depletion  Upper Arm Region  Severe depletion  Thoracic and Lumbar Region  Unable to assess  Buccal Region  Severe depletion  Temple Region  Severe depletion  Clavicle Bone Region  Severe depletion  Clavicle and Acromion Bone Region  Severe depletion  Scapular Bone Region  Severe depletion  Dorsal Hand  Severe depletion   Patellar Region  Unable to assess  Anterior Thigh Region  Unable to assess  Posterior Calf Region  Unable to assess  Edema (RD Assessment)  None       Diet Order:   Diet Order            Diet NPO time specified  Diet effective midnight        Diet full liquid Room service appropriate? Yes; Fluid consistency: Thin  Diet effective now        Diet regular Room service appropriate? Yes; Fluid consistency: Thin  Diet effective now              EDUCATION NEEDS:   Education needs have been addressed  Skin:  Skin Assessment: Reviewed RN Assessment  Last BM:  2/4  Height:   Ht Readings from Last 1 Encounters:  09/23/18 5\' 4"  (1.626 m)    Weight:   Wt Readings from Last 1 Encounters:  09/23/18 37.4 kg    Ideal Body Weight:  54.5 kg  BMI:  Body mass index is 14.15 kg/m.  Estimated Nutritional Needs:   Kcal:  1650-1850  Protein:  85-95g  Fluid:  1.9L/day   Clayton Bibles, MS, RD, LDN [place] [facility] Pager: [phone] After Hours Pager: [phone]

## 2018-09-26 ENCOUNTER — Inpatient Hospital Stay (HOSPITAL_COMMUNITY): Payer: Medicare Other | Admitting: Anesthesiology

## 2018-09-26 ENCOUNTER — Encounter (HOSPITAL_COMMUNITY): Payer: Self-pay

## 2018-09-26 ENCOUNTER — Inpatient Hospital Stay (HOSPITAL_COMMUNITY): Payer: Medicare Other

## 2018-09-26 ENCOUNTER — Encounter (HOSPITAL_COMMUNITY)
Admission: EM | Disposition: A | Discharge status: Hospice | Payer: Self-pay | Source: Home / Self Care | Attending: Internal Medicine

## 2018-09-26 ENCOUNTER — Ambulatory Visit (HOSPITAL_COMMUNITY): Admission: RE | Admit: 2018-09-26 | Payer: Medicare Other | Source: Ambulatory Visit | Admitting: Gastroenterology

## 2018-09-26 DIAGNOSIS — K831 Obstruction of bile duct: Secondary | ICD-10-CM

## 2018-09-26 HISTORY — PX: ENDOSCOPIC RETROGRADE CHOLANGIOPANCREATOGRAPHY (ERCP) WITH PROPOFOL: SHX5810

## 2018-09-26 LAB — CBC
HCT: 29.4 % — ABNORMAL LOW (ref 36.0–46.0)
Hemoglobin: 9.8 g/dL — ABNORMAL LOW (ref 12.0–15.0)
MCH: 33 pg (ref 26.0–34.0)
MCHC: 33.3 g/dL (ref 30.0–36.0)
MCV: 99 fL (ref 80.0–100.0)
Platelets: 125 10*3/uL — ABNORMAL LOW (ref 150–400)
RBC: 2.97 MIL/uL — ABNORMAL LOW (ref 3.87–5.11)
RDW: 21.9 % — ABNORMAL HIGH (ref 11.5–15.5)
WBC: 6.9 10*3/uL (ref 4.0–10.5)
nRBC: 0 % (ref 0.0–0.2)

## 2018-09-26 LAB — BPAM FFP
Blood Product Expiration Date: 202002102359
Blood Product Expiration Date: 202002102359
ISSUE DATE / TIME: 202002051659
ISSUE DATE / TIME: 202002051530
UNIT TYPE AND RH: 8400
Unit Type and Rh: 8400

## 2018-09-26 LAB — COMPREHENSIVE METABOLIC PANEL
ALT: 58 U/L — ABNORMAL HIGH (ref 0–44)
AST: 64 U/L — AB (ref 15–41)
Albumin: 2.1 g/dL — ABNORMAL LOW (ref 3.5–5.0)
Alkaline Phosphatase: 950 U/L — ABNORMAL HIGH (ref 38–126)
Anion gap: 4 — ABNORMAL LOW (ref 5–15)
BUN: 16 mg/dL (ref 8–23)
CO2: 21 mmol/L — ABNORMAL LOW (ref 22–32)
Calcium: 7.7 mg/dL — ABNORMAL LOW (ref 8.9–10.3)
Chloride: 115 mmol/L — ABNORMAL HIGH (ref 98–111)
Creatinine, Ser: 0.56 mg/dL (ref 0.44–1.00)
GFR calc Af Amer: >60 mL/min (ref >60)
GFR calc non Af Amer: >60 mL/min (ref >60)
Glucose, Bld: 98 mg/dL (ref 70–99)
Potassium: 3.1 mmol/L — ABNORMAL LOW (ref 3.5–5.1)
Sodium: 140 mmol/L (ref 135–145)
Total Bilirubin: 7 mg/dL — ABNORMAL HIGH (ref 0.3–1.2)
Total Protein: 5.9 g/dL — ABNORMAL LOW (ref 6.5–8.1)

## 2018-09-26 LAB — PROTIME-INR
INR: 1.39
Prothrombin Time: 16.9 seconds — ABNORMAL HIGH (ref 11.4–15.2)

## 2018-09-26 LAB — PREPARE FRESH FROZEN PLASMA

## 2018-09-26 LAB — MAGNESIUM: Magnesium: 2 mg/dL (ref 1.7–2.4)

## 2018-09-26 SURGERY — ENDOSCOPIC RETROGRADE CHOLANGIOPANCREATOGRAPHY (ERCP) WITH PROPOFOL
Anesthesia: Monitor Anesthesia Care

## 2018-09-26 MED ORDER — POTASSIUM CHLORIDE 10 MEQ/100ML IV SOLN
INTRAVENOUS | Status: AC
  Administered 2018-09-26: 10 meq
  Filled 2018-09-26: qty 100

## 2018-09-26 MED ORDER — ROCURONIUM BROMIDE 50 MG/5ML IV SOSY
PREFILLED_SYRINGE | INTRAVENOUS | Status: DC | PRN
  Administered 2018-09-26: 30 mg via INTRAVENOUS

## 2018-09-26 MED ORDER — CEFAZOLIN SODIUM-DEXTROSE 2-4 GM/100ML-% IV SOLN
2.0000 g | INTRAVENOUS | Status: AC
  Filled 2018-09-26: qty 100

## 2018-09-26 MED ORDER — LIDOCAINE 2% (20 MG/ML) 5 ML SYRINGE
INTRAMUSCULAR | Status: DC | PRN
  Administered 2018-09-26: 40 mg via INTRAVENOUS

## 2018-09-26 MED ORDER — POTASSIUM CHLORIDE 10 MEQ/100ML IV SOLN
10.0000 meq | INTRAVENOUS | Status: AC
  Administered 2018-09-26 (×2): 10 meq via INTRAVENOUS
  Filled 2018-09-26 (×2): qty 100

## 2018-09-26 MED ORDER — INDOMETHACIN 50 MG RE SUPP
RECTAL | Status: DC | PRN
  Administered 2018-09-26: 100 mg via RECTAL

## 2018-09-26 MED ORDER — PHENYLEPHRINE 40 MCG/ML (10ML) SYRINGE FOR IV PUSH (FOR BLOOD PRESSURE SUPPORT)
PREFILLED_SYRINGE | INTRAVENOUS | Status: DC | PRN
  Administered 2018-09-26: 80 ug via INTRAVENOUS

## 2018-09-26 MED ORDER — PROPOFOL 10 MG/ML IV BOLUS
INTRAVENOUS | Status: DC | PRN
  Administered 2018-09-26: 70 mg via INTRAVENOUS

## 2018-09-26 MED ORDER — CIPROFLOXACIN IN D5W 400 MG/200ML IV SOLN
INTRAVENOUS | Status: AC
  Filled 2018-09-26: qty 200

## 2018-09-26 MED ORDER — LACTATED RINGERS IV SOLN
INTRAVENOUS | Status: DC | PRN
  Administered 2018-09-26: 11:00:00 via INTRAVENOUS

## 2018-09-26 MED ORDER — CIPROFLOXACIN IN D5W 200 MG/100ML IV SOLN
200.0000 mg | Freq: Once | INTRAVENOUS | Status: AC
  Administered 2018-09-26: 200 mg via INTRAVENOUS
  Filled 2018-09-26: qty 100

## 2018-09-26 MED ORDER — ONDANSETRON HCL 4 MG/2ML IJ SOLN
INTRAMUSCULAR | Status: DC | PRN
  Administered 2018-09-26: 4 mg via INTRAVENOUS

## 2018-09-26 MED ORDER — SUGAMMADEX SODIUM 200 MG/2ML IV SOLN
INTRAVENOUS | Status: DC | PRN
  Administered 2018-09-26: 150 mg via INTRAVENOUS

## 2018-09-26 MED ORDER — FENTANYL CITRATE (PF) 100 MCG/2ML IJ SOLN
INTRAMUSCULAR | Status: DC | PRN
  Administered 2018-09-26: 50 ug via INTRAVENOUS

## 2018-09-26 MED ORDER — ESMOLOL HCL 100 MG/10ML IV SOLN
INTRAVENOUS | Status: DC | PRN
  Administered 2018-09-26: 20 mg via INTRAVENOUS

## 2018-09-26 MED ORDER — INDOMETHACIN 50 MG RE SUPP
RECTAL | Status: AC
  Filled 2018-09-26: qty 2

## 2018-09-26 MED ORDER — GLUCAGON HCL RDNA (DIAGNOSTIC) 1 MG IJ SOLR
INTRAMUSCULAR | Status: AC
  Filled 2018-09-26: qty 1

## 2018-09-26 NOTE — Progress Notes (Addendum)
Patient ID: Tammy Adams, female   DOB: 1951/03/15, 68 y.o.   MRN: [id number]     Progress Note   Subjective  Comfortable, denies any pain. Says she is very hungry . Understands she is having procedure  INR 1.39  Tbili 7  K=3.1   Objective   Vital signs in last 24 hours: Temp:  [97.8 F (36.6 C)-98.3 F (36.8 C)] 98 F (36.7 C) (02/06 0529) Pulse Rate:  [59-70] 59 (02/06 0529) Resp:  [16-20] 17 (02/06 0529) BP: (100-120)/(64-72) 100/64 (02/06 0529) SpO2:  [99 %-100 %] 100 % (02/06 0529) Last BM Date: 09/25/18 General:     Chronically ill appearing AA female, cachectic  in NAD Heart:  Regular rate and rhythm; no murmurs Lungs: Respirations even and unlabored, lungs CTA bilaterally Abdomen:  Soft,no focal tenderness, +ascites, and hepatomegaly , and full mass effect left upper abdomen, Bs+ Extremities:  Without edema. Neurologic:  Alert and oriented,  grossly normal neurologically. Psych:  Cooperative. Normal mood and affect.  Intake/Output from previous day: 02/05 0701 - 02/06 0700 In: 3653.3 [P.O.:360; I.V.:2867.5; Blood:425.8] Out: -  Intake/Output this shift: No intake/output data recorded.  Lab Results: Recent Labs    09/24/18 0610 09/26/18 0507  WBC 7.6 6.9  HGB 10.5* 9.8*  HCT 30.9* 29.4*  PLT 155 125*   BMET Recent Labs    09/24/18 0610 09/26/18 0507  NA 142 140  K 3.2* 3.1*  CL 116* 115*  CO2 20* 21*  GLUCOSE 80 98  BUN 34* 16  CREATININE 0.75 0.56  CALCIUM 7.8* 7.7*   LFT Recent Labs    09/26/18 0507  PROT 5.9*  ALBUMIN 2.1*  AST 64*  ALT 58*  ALKPHOS 950*  BILITOT 7.0*   PT/INR Recent Labs    09/25/18 1039 09/26/18 0507  LABPROT 25.5* 16.9*  INR 2.37 1.39    Studies/Results: US Renal  Result Date: 09/24/2018 CLINICAL DATA:  Renal failure. EXAM: RENAL / URINARY TRACT ULTRASOUND COMPLETE COMPARISON:  MRI of 09/17/2018. FINDINGS: Right Kidney: Renal measurements: 10.14.0 x 5.4 cm = volume: 1011.8 mL. Increased echogenicity. 2.3  cm simple cyst. 1.8 cm simple cyst. No hydronephrosis visualized. Left Kidney: Renal measurements: 9.5 x 4.5 x 5.1 cm = volume: 1012.4 mL. Increased echogenicity. 20.7 cm simple cyst. No hydronephrosis. Visualized. Bladder: Small amount of debris noted the bladder. Bladder wall thickening can not be excluded. Bladder pathology can not be excluded. Mild ascites noted. IMPRESSION: 1. Increased renal echogenicity consistent with chronic medical renal disease. Simple bilateral renal cysts noted as above. 2. Small amount of debris noted the bladder. Bladder wall thickening can not be excluded. Better pathology can not be excluded. 3.  Mild ascites. Electronically Signed   By: Marcello Moores  Register   On: 09/24/2018 12:47       Assessment / Plan:     #1 68 yo AA female with  Advanced metastatic pancreatic cancer  with bilary obstruction.  Pt scheduled for  ERCP /stent placement today per Dr Ardis Hughs  Coagulopathy has been corrected  Antibiotics to be given pre-procedure   #2 hypokalemia - needs  Corrected- runs have been ordered #3 anemia - secondary to # 1           Principal Problem:   Pancreatic cancer metastasized to liver [facility]) Active Problems:   Anemia   Protein-calorie malnutrition, severe     LOS: 2 days   Tammy Adams  09/26/2018, 9:18 AM

## 2018-09-26 NOTE — Anesthesia Procedure Notes (Signed)
Procedure Name: Intubation Date/Time: 09/26/2018 11:52 AM Performed by: Deliah Boston, CRNA Pre-anesthesia Checklist: Patient identified, Emergency Drugs available, Suction available and Patient being monitored Patient Re-evaluated:Patient Re-evaluated prior to induction Oxygen Delivery Method: Circle system utilized Preoxygenation: Pre-oxygenation with 100% oxygen Induction Type: IV induction Ventilation: Mask ventilation without difficulty Laryngoscope Size: Mac and 3 Grade View: Grade I Tube type: Oral Tube size: 7.0 mm Number of attempts: 1 Airway Equipment and Method: Stylet and Oral airway Placement Confirmation: ETT inserted through vocal cords under direct vision,  positive ETCO2 and breath sounds checked- equal and bilateral Secured at: 19 cm Tube secured with: Tape Dental Injury: Teeth and Oropharynx as per pre-operative assessment

## 2018-09-26 NOTE — Consult Note (Signed)
Chief Complaint: Patient was seen in consultation today for obstructive jaundice.  Referring Physician(s): Reesa Chew, Ankit Chirag  Supervising Physician: Arne Cleveland  Patient Status: [facility] - In-pt  History of Present Illness: Tammy Adams is a 68 y.o. female with a past medical history of pancreatic cancer and anxiety. She was unfortunately diagnosed with pancreatic cancer 06/2017. Her cancer is managed by Dr. Burr Medico. She presented to [facility] ED 09/23/2018 with complaint of generalized weakness. She was found to have obstructive jaundice secondary to increased size of her pancreatic mass and was admitted for further management. She underwent ERCP in OR today with Dr. Ardis Hughs for possible biliary stent which was unfortunately unsuccessful.  IR requested by Dr. Reesa Chew for possible image-guided percutaneous biliary stent placement. Patient awake and alert sitting in bed. Accompanied by husband and female friend. Complains of abdominal pain, stable since admission. Denies fever, chills, chest pain, dyspnea, dizziness, or headache.   Past Medical History:  Diagnosis Date  . Anxiety   . Family history of colon cancer   . Family history of pancreatic cancer   . Family history of prostate cancer   . pancreatic ca dx'd 06/2017    Past Surgical History:  Procedure Laterality Date  . IR FLUORO GUIDE PORT INSERTION RIGHT  07/24/2017  . IR US GUIDE VASC ACCESS RIGHT  07/24/2017    Allergies: Patient has no known allergies.  Medications: Prior to Admission medications   Medication Sig Start Date End Date Taking? Authorizing Provider  diphenoxylate-atropine (LOMOTIL) 2.5-0.025 MG tablet Take 1-2 tablets by mouth 4 (four) times daily as needed for diarrhea or loose stools. 08/15/18  Yes Truitt Merle, MD  dronabinol (MARINOL) 2.5 MG capsule TAKE 1 CAPSULE BY MOUTH TWICE DAILY BEFORE MEAL(S) Patient taking differently: Take 2.5 mg by mouth 2 (two) times daily before a meal. TAKE 1 CAPSULE BY MOUTH TWICE  DAILY BEFORE MEAL(S) 08/15/18  Yes Truitt Merle, MD  LORazepam (ATIVAN) 1 MG tablet Take 1 mg by mouth at bedtime.    Yes [provider]  mirtazapine (REMERON) 15 MG tablet TAKE 1 TABLET BY MOUTH AT BEDTIME Patient taking differently: Take 15 mg by mouth at bedtime.  09/19/18  Yes Truitt Merle, MD  Multiple Vitamin (MULTIVITAMIN WITH MINERALS) TABS tablet Take 1 tablet daily by mouth. One-A-Day for Women   Yes [provider]  Pancrelipase, Lip-Prot-Amyl, (CREON) 24000-76000 units CPEP Take 1 capsule (24,000 Units total) by mouth 3 (three) times daily before meals. 07/03/18  Yes Alla Feeling, NP  potassium chloride SA (K-DUR,KLOR-CON) 20 MEQ tablet Take 1 tablet (20 mEq total) by mouth 2 (two) times daily. Take 4 tablets (20 meq) today, then take one tablet twice daily. Patient taking differently: Take 20 mEq by mouth daily.  09/20/18  Yes Truitt Merle, MD  capecitabine (XELODA) 500 MG tablet Take 2 tablets (1000mg ) by mouth in AM & 1 tab (500mg ) in PM, immediately after food. Take for 14 days on, 7 days off, repeated every 21 d Patient not taking: Reported on 09/23/2018 07/29/18   Truitt Merle, MD  prochlorperazine (COMPAZINE) 10 MG tablet Take 1 tablet (10 mg total) by mouth every 6 (six) hours as needed (Nausea or vomiting). 07/23/17 02/04/18  Truitt Merle, MD     Family History  Problem Relation Age of Onset  . Prostate cancer Father        d. 62  . Pancreatic cancer Maternal Aunt 70  . Colon cancer Maternal Grandmother  dx in her 50s  . Prostate cancer Maternal Uncle   . Lung cancer Cousin   . Cancer Cousin        unknown type  . Cancer Cousin 65       daughter of cousin with lung cancer    Social History   Socioeconomic History  . Marital status: Married    Spouse name: Not on file  . Number of children: Not on file  . Years of education: Not on file  . Highest education level: Not on file  Occupational History  . Not on file  Social Needs  . Financial resource  strain: Not on file  . Food insecurity:    Worry: Not on file    Inability: Not on file  . Transportation needs:    Medical: Not on file    Non-medical: Not on file  Tobacco Use  . Smoking status: Current Every Day Smoker    Packs/day: 0.50    Years: 50.00    Pack years: 25.00  . Smokeless tobacco: Never Used  Substance and Sexual Activity  . Alcohol use: No    Frequency: Never  . Drug use: Yes    Types: Marijuana  . Sexual activity: Not on file  Lifestyle  . Physical activity:    Days per week: Not on file    Minutes per session: Not on file  . Stress: Not on file  Relationships  . Social connections:    Talks on phone: Not on file    Gets together: Not on file    Attends religious service: Not on file    Active member of club or organization: Not on file    Attends meetings of clubs or organizations: Not on file    Relationship status: Not on file  Other Topics Concern  . Not on file  Social History Narrative  . Not on file     Review of Systems: A 12 point ROS discussed and pertinent positives are indicated in the HPI above.  All other systems are negative.  Review of Systems  Constitutional: Negative for chills and fever.  Respiratory: Negative for shortness of breath and wheezing.   Cardiovascular: Negative for chest pain and palpitations.  Gastrointestinal: Positive for abdominal pain.  Neurological: Negative for dizziness and headaches.  Psychiatric/Behavioral: Negative for behavioral problems and confusion.    Vital Signs: BP 136/61   Pulse (!) 45   Temp 98 F (36.7 C) (Oral)   Resp 13   Ht 5\' 4"  (1.626 m)   Wt 82 lb (37.2 kg)   SpO2 99%   BMI 14.08 kg/m   Physical Exam Vitals signs and nursing note reviewed.  Constitutional:      General: She is not in acute distress.    Appearance: Normal appearance.  Cardiovascular:     Rate and Rhythm: Normal rate and regular rhythm.     Heart sounds: Normal heart sounds. No murmur.  Pulmonary:      Effort: Pulmonary effort is normal. No respiratory distress.     Breath sounds: Normal breath sounds. No wheezing.  Abdominal:     Tenderness: There is abdominal tenderness. There is no guarding.  Skin:    General: Skin is warm and dry.  Neurological:     Mental Status: She is alert and oriented to person, place, and time.  Psychiatric:        Mood and Affect: Mood normal.        Behavior: Behavior normal.  Thought Content: Thought content normal.        Judgment: Judgment normal.      MD Evaluation Airway: WNL Heart: WNL Abdomen: WNL Chest/ Lungs: WNL ASA  Classification: 3 Mallampati/Airway Score: Two   Imaging: US Abdomen Complete  Result Date: 09/03/2018 CLINICAL DATA:  Pancreatic cancer metastasized to the liver. EXAM: ABDOMEN ULTRASOUND COMPLETE COMPARISON:  CT scan of July 10, 2018. FINDINGS: Gallbladder: Distended gallbladder is noted with sludge, but no cholelithiasis. No gallbladder wall thickening or pericholecystic fluid is noted. No sonographic Murphy's sign is noted. Common bile duct: Diameter: 17 mm which is consistent with significant dilatation. Liver: 1 cm predominately solid abnormality is seen in right hepatic lobe peripherally concerning for possible metastatic lesion. Severe intrahepatic biliary dilatation is noted as well. Multiple cysts of varying sizes are seen throughout hepatic parenchyma. Portal vein is patent on color Doppler imaging with normal direction of blood flow towards the liver. IVC: No abnormality visualized. Pancreas: Multiple cystic lesions are seen in the pancreas with the largest measuring 4.4 cm. Complex abnormality seen in the pancreatic head which may represent malignancy. Multiple stones are noted in pancreatic duct. Spleen: Size and appearance within normal limits. Right Kidney: Length: 9 cm. Increased echogenicity of renal parenchyma is noted. 2.7 cm cyst is noted in upper pole. 1.9 cm complex cyst is seen in lower pole. No mass  or hydronephrosis visualized. Left Kidney: Length: 9.3 cm. Increased echogenicity of left renal parenchyma is noted. Multiple simple cysts are noted. No mass or hydronephrosis visualized. Abdominal aorta: No aneurysm visualized. Other findings: None. IMPRESSION: Distended gallbladder with sludge, but no cholelithiasis. Severe intrahepatic and extrahepatic biliary dilatation is noted, consistent with history of pancreatic neoplasm. Multiple cystic lesions are noted in the pancreatic head with the largest measuring 4.4 cm. Complex abnormality is noted in pancreatic head which may represent malignancy. Multiple stones are noted in the pancreatic duct. Multiple cystic lesions are seen throughout the liver which may represent metastatic disease. Probable 1 cm predominantly solid right hepatic abnormality is noted concerning for metastatic disease. Increased echogenicity of renal parenchyma is noted bilaterally consistent with medical renal disease. Electronically Signed   By: Marijo Conception, M.D.   On: 09/03/2018 14:10   US Renal  Result Date: 09/24/2018 CLINICAL DATA:  Renal failure. EXAM: RENAL / URINARY TRACT ULTRASOUND COMPLETE COMPARISON:  MRI of 09/17/2018. FINDINGS: Right Kidney: Renal measurements: 10.14.0 x 5.4 cm = volume: 1011.8 mL. Increased echogenicity. 2.3 cm simple cyst. 1.8 cm simple cyst. No hydronephrosis visualized. Left Kidney: Renal measurements: 9.5 x 4.5 x 5.1 cm = volume: 1012.4 mL. Increased echogenicity. 20.7 cm simple cyst. No hydronephrosis. Visualized. Bladder: Small amount of debris noted the bladder. Bladder wall thickening can not be excluded. Bladder pathology can not be excluded. Mild ascites noted. IMPRESSION: 1. Increased renal echogenicity consistent with chronic medical renal disease. Simple bilateral renal cysts noted as above. 2. Small amount of debris noted the bladder. Bladder wall thickening can not be excluded. Better pathology can not be excluded. 3.  Mild ascites.  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 Jaundice. EXAM: MRI ABDOMEN WITHOUT AND WITH CONTRAST (INCLUDING MRCP) TECHNIQUE: Multiplanar multisequence MR imaging of the abdomen was performed both before and after the administration of intravenous contrast. Heavily T2-weighted images of the biliary and pancreatic ducts were obtained, and three-dimensional MRCP images  were rendered by post processing. CONTRAST:  3 cc Gadavist COMPARISON:  CT scan 07/10/2018 FINDINGS: Lower chest: Unremarkable. Hepatobiliary: Innumerable cystic lesions are identified in the liver, as before. Index posterior right lesion measured previously at 18 mm is 19 mm today. There is fairly marked intra and extrahepatic biliary duct dilatation, progressive in the interval. Common duct measures up to 17 mm diameter and appears compressed by new cystic disease in the head and body of pancreas. Pancreas: Marked progression of cystic disease in the head and body of pancreas with a cystic conglomeration in this region now measuring 10.2 x 4.6 x 6.8 cm. There is massive dilatation of the pancreatic duct in the tail of pancreas with overlying parenchymal atrophy. Spleen:  No splenomegaly. No focal mass lesion. Adrenals/Urinary Tract: No adrenal nodule or mass. Right renal cysts again noted. Tiny cysts are noted in the left kidney Stomach/Bowel: Stomach is unremarkable. No gastric wall thickening. No evidence of outlet obstruction. Cystic disease in the pancreas generates marked mass-effect on the body and antrum of the stomach. Duodenum is normally positioned as is the ligament of Treitz. No small bowel or colonic dilatation within the abdomen. Vascular/Lymphatic: No abdominal aortic aneurysm. No discrete lymphadenopathy evident although there is diffuse abnormal enhancing soft tissue in the central mesentery  around the SMA. Portal vein is attenuated but appears patent. Patency of the SMV can not be confirmed. Splenic vein is patent. Other:  Cachexia with diffuse soft tissue edema. Musculoskeletal: No abnormal marrow enhancement within the visualized bony anatomy. IMPRESSION: 1. Interval development of a large conglomeration of cystic disease in the head and body of pancreas measuring 10 x 5 x 7 cm. The common bile duct is markedly dilated and then tapers as it tracks past this new cystic involvement. Mass-effect by the cysts may be the etiology for the biliary obstruction. Distal common bile duct into the ampulla is not visualized on this study. 2. Similar appearance of liver disease. 3. Portal vein and splenic vein are patent. Patency of the superior mesenteric vein can not be confirmed on this study and the vessel may be markedly attenuated or occluded in the region of the portal splenic confluence. Electronically Signed   By: Misty Stanley M.D.   On: 09/17/2018 14:59   Dg C-arm 1-60 Min-no Report  Result Date: 09/26/2018 Fluoroscopy was utilized by the requesting physician.  No radiographic interpretation.   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 Jaundice. EXAM: MRI ABDOMEN WITHOUT AND WITH CONTRAST (INCLUDING MRCP) TECHNIQUE: Multiplanar multisequence MR imaging of the abdomen was performed both before and after the administration of intravenous contrast. Heavily T2-weighted images of the biliary and pancreatic ducts were obtained, and three-dimensional MRCP images were rendered by post processing. CONTRAST:  3 cc Gadavist COMPARISON:  CT scan 07/10/2018 FINDINGS: Lower chest: Unremarkable. Hepatobiliary: Innumerable cystic lesions are identified in the liver, as before. Index posterior right lesion measured previously at 18 mm is 19 mm today. There is fairly marked intra and extrahepatic biliary duct dilatation, progressive in  the interval. Common duct measures up to 17 mm diameter and appears compressed by new cystic disease in the head and body of pancreas. Pancreas: Marked progression of cystic disease in the head and body of pancreas with a cystic conglomeration in this region now measuring 10.2 x 4.6 x 6.8 cm. There is massive dilatation of the pancreatic duct in the tail of pancreas with overlying parenchymal atrophy.  Spleen:  No splenomegaly. No focal mass lesion. Adrenals/Urinary Tract: No adrenal nodule or mass. Right renal cysts again noted. Tiny cysts are noted in the left kidney Stomach/Bowel: Stomach is unremarkable. No gastric wall thickening. No evidence of outlet obstruction. Cystic disease in the pancreas generates marked mass-effect on the body and antrum of the stomach. Duodenum is normally positioned as is the ligament of Treitz. No small bowel or colonic dilatation within the abdomen. Vascular/Lymphatic: No abdominal aortic aneurysm. No discrete lymphadenopathy evident although there is diffuse abnormal enhancing soft tissue in the central mesentery around the SMA. Portal vein is attenuated but appears patent. Patency of the SMV can not be confirmed. Splenic vein is patent. Other:  Cachexia with diffuse soft tissue edema. Musculoskeletal: No abnormal marrow enhancement within the visualized bony anatomy. IMPRESSION: 1. Interval development of a large conglomeration of cystic disease in the head and body of pancreas measuring 10 x 5 x 7 cm. The common bile duct is markedly dilated and then tapers as it tracks past this new cystic involvement. Mass-effect by the cysts may be the etiology for the biliary obstruction. Distal common bile duct into the ampulla is not visualized on this study. 2. Similar appearance of liver disease. 3. Portal vein and splenic vein are patent. Patency of the superior mesenteric vein can not be confirmed on this study and the vessel may be markedly attenuated or occluded in the region of the  portal splenic confluence. Electronically Signed   By: Misty Stanley M.D.   On: 09/17/2018 14:59    Labs:  CBC: Recent Labs    09/20/18 1029 09/23/18 0839 09/24/18 0610 09/26/18 0507  WBC 9.6 10.1 7.6 6.9  HGB 7.2* 7.0* 10.5* 9.8*  HCT 20.4* 19.8* 30.9* 29.4*  PLT 190 192 155 125*    COAGS: Recent Labs    09/25/18 1039 09/26/18 0507  INR 2.37 1.39    BMP: Recent Labs    09/20/18 1029 09/23/18 0839 09/24/18 0610 09/26/18 0507  NA 143 144 142 140  K 2.6* 3.5 3.2* 3.1*  CL 114* 119* 116* 115*  CO2 19* 19* 20* 21*  GLUCOSE 134* 79 80 98  BUN 16 28* 34* 16  CALCIUM 8.6* 8.4* 7.8* 7.7*  CREATININE 0.92 0.93 0.75 0.56  GFRNONAA >60 >60 >60 >60  GFRAA >60 >60 >60 >60    LIVER FUNCTION TESTS: Recent Labs    09/20/18 1029 09/23/18 0839 09/24/18 0610 09/26/18 0507  BILITOT 11.2* 9.8* 8.6* 7.0*  AST 86* 85* 82* 64*  ALT 79* 78* 71* 58*  ALKPHOS 1,566* 1,382* 1,082* 950*  PROT 6.8 6.6 6.4* 5.9*  ALBUMIN 2.3* 2.2* 2.2* 2.1*    TUMOR MARKERS: No results for input(s): AFPTM, CEA, CA199, CHROMGRNA in the last 8760 hours.  Assessment and Plan:  Obstructive jaundice. Plan for image-guided percutaneous biliary drain placement tentatively for tomorrow 09/27/2018 with Dr. Laurence Ferrari. Patient will be NPO at midnight. Afebrile and WBCs WNL. She does not take blood thinners. INR 1.39 seconds today.  Consent obtained from patient's husband/POA.  Risks and benefits of biliary drain placement discussed with the patient including, but not limited to bleeding, infection which may lead to sepsis or even death and damage to adjacent structures. This interventional procedure involves the use of X-rays and because of the nature of the planned procedure, it is possible that we will have prolonged use of X-ray fluoroscopy. Potential radiation risks to you include (but are not limited to) the following: - A slightly elevated risk  for cancer  several years later in life. This  risk is typically less than 0.5% percent. This risk is low in comparison to the normal incidence of human cancer, which is 33% for women and 50% for men according to the [employer]. - Radiation induced injury can include skin redness, resembling a rash, tissue breakdown / ulcers and hair loss (which can be temporary or permanent).  The likelihood of either of these occurring depends on the difficulty of the procedure and whether you are sensitive to radiation due to previous procedures, disease, or genetic conditions.  IF your procedure requires a prolonged use of radiation, you will be notified and given written instructions for further action.  It is your responsibility to monitor the irradiated area for the 2 weeks following the procedure and to notify your physician if you are concerned that you have suffered a radiation induced injury.   All of the patient's questions were answered, patient is agreeable to proceed. Consent signed and in chart.   Thank you for this interesting consult.  I greatly enjoyed meeting Tocara Jean and look forward to participating in their care.  A copy of this report was sent to the requesting provider on this date.  Electronically Signed: Earley Abide, PA-C 09/26/2018, 4:14 PM   I spent a total of 40 Minutes in face to face in clinical consultation, greater than 50% of which was counseling/coordinating care for obstructive jaundice.

## 2018-09-26 NOTE — Progress Notes (Signed)
Patient having loose watery stools x3 this morning. Dr Tamela Gammon notified ,orders received to monitor  patient and not to collect any specimen.

## 2018-09-26 NOTE — Progress Notes (Signed)
PROGRESS NOTE    Tammy Adams  URK:[id number] DOB: Jan 01, 1951 DOA: 09/23/2018 PCP: Dineen Kid, MD   Brief Narrative:  68 year old with a history of extensively metastatic pancreatic cancer stage IV came to the hospital for evaluation of progressive weakness and dizziness.  Recent MRI had shown increase in size of abdominal mass outpatient and case was discussed with gastroenterology and there were initial plans to get outpatient stent placement/ERCP but in the meantime patient worsened therefore came to the hospital.  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 -Difficult cannulation on ERCP today, case will be discussed by IR -I have consulted palliative care to help establish long-term care goals.  Patient overall has poor prognosis given advanced malignancy, advanced age and poor nutritional status -Diet as tolerated -Supportive care.  Monitor LFTs.  Anorexia with failure to thrive Severe protein calorie malnutrition Anemia of chronic disease -Encourage oral diet. Nutrition supplements added   DVT prophylaxis: SCDs Code Status: DNR Family Communication: Daughter at bedside Disposition Plan: Maintain inpatient stay  Consultants:   Gastroenterology  Oncology  Procedures:   None  Antimicrobials:   None   Subjective: Patient was seen and evaluated by me prior to her ERCP this morning.  She did not have any complaints except she was stating she was hungry.  Daughter was at the bedside.  Review of Systems Otherwise negative except as per HPI, including: General = no  fevers, chills, dizziness, malaise, fatigue HEENT/EYES = negative for pain, redness, loss of vision, double vision, blurred vision, loss of hearing, sore throat, hoarseness, dysphagia Cardiovascular= negative for chest pain, palpitation, murmurs, lower extremity swelling Respiratory/lungs= negative for shortness of breath, cough, hemoptysis, wheezing, mucus production Gastrointestinal= negative for nausea, vomiting,, abdominal pain, melena, hematemesis Genitourinary= negative for Dysuria, Hematuria, Change in Urinary Frequency MSK = Negative for arthralgia, myalgias, Back Pain, Joint swelling  Neurology= Negative for headache, seizures, numbness, tingling  Psychiatry= Negative for anxiety, depression, suicidal and homocidal ideation Allergy/Immunology= Medication/Food allergy as listed  Skin= Negative for Rash, lesions, ulcers, itching   Objective: Vitals:   09/26/18 1320 09/26/18 1325 09/26/18 1330 09/26/18 1340  BP: 120/60  (!) 126/37 123/60  Pulse: (!) 59 (!) 57 (!) 45 (!) 41  Resp: 13 12 11 11   Temp:      TempSrc:      SpO2: 100% 100% 100% 100%  Weight:      Height:        Intake/Output Summary (Last 24 hours) at 09/26/2018 1349 Last data filed at 09/26/2018 1304 Gross per 24 hour  Intake 4093.31 ml  Output -  Net 4093.31 ml   Filed Weights   09/23/18 1834 09/26/18 1037  Weight: 37.4 kg 37.2 kg    Examination:  Constitutional: NAD, calm, comfortable, elderly frail-appearing with bilateral temporal wasting Eyes: PERRL, lids and conjunctivae normal ENMT: Mucous membranes are moist. Posterior pharynx clear of any exudate or lesions.Normal dentition.  Neck: normal, supple, no masses, no thyromegaly Respiratory: clear to auscultation bilaterally, no wheezing, no crackles. Normal respiratory effort. No accessory muscle use.  Cardiovascular: Regular rate and rhythm, no murmurs / rubs /  gallops. No extremity edema. 2+ pedal pulses. No carotid bruits.  Abdomen: no tenderness, no  masses palpated. No hepatosplenomegaly. Bowel sounds positive.  Musculoskeletal: no clubbing / cyanosis. No joint deformity upper and lower extremities. Good ROM, no contractures. Normal muscle tone.  Skin: no rashes, lesions, ulcers. No induration Neurologic: CN 2-12 grossly intact. Sensation intact, DTR normal. Strength 4/5 in all 4.  Psychiatric: Normal judgment and insight. Alert and oriented x 3. Normal mood.      Data Reviewed:   CBC: Recent Labs  Lab 09/20/18 1029 09/23/18 0839 09/24/18 0610 09/26/18 0507  WBC 9.6 10.1 7.6 6.9  NEUTROABS 8.5* 9.0*  --   --   HGB 7.2* 7.0* 10.5* 9.8*  HCT 20.4* 19.8* 30.9* 29.4*  MCV 102.0* 103.1* 94.8 99.0  PLT 190 192 155 778*   Basic Metabolic Panel: Recent Labs  Lab 09/20/18 1029 09/23/18 0839 09/24/18 0610 09/26/18 0507  NA 143 144 142 140  K 2.6* 3.5 3.2* 3.1*  CL 114* 119* 116* 115*  CO2 19* 19* 20* 21*  GLUCOSE 134* 79 80 98  BUN 16 28* 34* 16  CREATININE 0.92 0.93 0.75 0.56  CALCIUM 8.6* 8.4* 7.8* 7.7*  MG  --   --   --  2.0   GFR: Estimated Creatinine Clearance: 39 mL/min (by C-G formula based on SCr of 0.56 mg/dL). Liver Function Tests: Recent Labs  Lab 09/20/18 1029 09/23/18 0839 09/24/18 0610 09/26/18 0507  AST 86* 85* 82* 64*  ALT 79* 78* 71* 58*  ALKPHOS 1,566* 1,382* 1,082* 950*  BILITOT 11.2* 9.8* 8.6* 7.0*  PROT 6.8 6.6 6.4* 5.9*  ALBUMIN 2.3* 2.2* 2.2* 2.1*   No results for input(s): LIPASE, AMYLASE in the last 168 hours. No results for input(s): AMMONIA in the last 168 hours. Coagulation Profile: Recent Labs  Lab 09/25/18 1039 09/26/18 0507  INR 2.37 1.39   Cardiac Enzymes: No results for input(s): CKTOTAL, CKMB, CKMBINDEX, TROPONINI in the last 168 hours. BNP (last 3 results) No results for input(s): PROBNP in the last 8760 hours. HbA1C: No results for input(s): HGBA1C in the last 72 hours. CBG: No results for input(s): GLUCAP in the last 168 hours. Lipid Profile: No results for  input(s): CHOL, HDL, LDLCALC, TRIG, CHOLHDL, LDLDIRECT in the last 72 hours. Thyroid Function Tests: No results for input(s): TSH, T4TOTAL, FREET4, T3FREE, THYROIDAB in the last 72 hours. Anemia Panel: No results for input(s): VITAMINB12, FOLATE, FERRITIN, TIBC, IRON, RETICCTPCT in the last 72 hours. Sepsis Labs: No results for input(s): PROCALCITON, LATICACIDVEN in the last 168 hours.  No results found for this or any previous visit (from the past 240 hour(s)).       Radiology Studies: Dg C-arm 1-60 Min-no Report  Result Date: 09/26/2018 Fluoroscopy was utilized by the requesting physician.  No radiographic interpretation.        Scheduled Meds: . [MAR Hold] sodium chloride   Intravenous Once  . [MAR Hold] furosemide  20 mg Intravenous Once  . [MAR Hold] lactose free nutrition  237 mL Oral TID WC  . [MAR Hold] lipase/protease/amylase  36,000 Units Oral TID AC  . [MAR Hold] LORazepam  1 mg Oral QHS  . [MAR Hold] mirtazapine  15 mg Oral QHS  . [MAR Hold] phytonadione  10 mg Subcutaneous Daily   Continuous Infusions: . [MAR Hold] sodium chloride 75 mL/hr at 09/23/18 1801     LOS: 2 days   Time spent= 35 mins    Dorthy Hustead Arsenio Loader, MD [facility]  If 7PM-7AM, please contact night-coverage [email or web address] 09/26/2018, 1:49 PM

## 2018-09-26 NOTE — Progress Notes (Signed)
Tammy Adams   DOB:[birth year]   QZ#:[id number]   [id number]  Oncology follow up   Subjective: Pt underwent ERCP this afternoon, unfortunately stent placement was not successful by Dr. Ardis Hughs.  I saw patient and her husband around 5 PM today, she was still drowsy from the procedure.  She denies any significant pain.  Objective:  Vitals:   09/26/18 1410 09/26/18 1418  BP: 136/61   Pulse: (!) 40 (!) 45  Resp: 11 13  Temp:    SpO2: 100% 99%    Body mass index is 14.08 kg/m.  Intake/Output Summary (Last 24 hours) at 09/26/2018 1857 Last data filed at 09/26/2018 1839 Gross per 24 hour  Intake 3442.48 ml  Output 2 ml  Net 3440.48 ml     Sclerae icteric  Oropharynx clear  No peripheral adenopathy  Lungs clear -- no rales or rhonchi  Heart regular rate and rhythm  Abdomen benign  MSK no focal spinal tenderness, no peripheral edema  Neuro nonfocal    CBG (last 3)  No results for input(s): GLUCAP in the last 72 hours.   Labs:  Lab Results  Component Value Date   WBC 6.9 09/26/2018   HGB 9.8 (L) 09/26/2018   HCT 29.4 (L) 09/26/2018   MCV 99.0 09/26/2018   PLT 125 (L) 09/26/2018   NEUTROABS 9.0 (H) 09/23/2018    Urine Studies No results for input(s): UHGB, CRYS in the last 72 hours.  Invalid input(s): UACOL, UAPR, USPG, UPH, UTP, UGL, UKET, UBIL, UNIT, UROB, [place], UEPI, UWBC, [place]ie Panning [place], [place], [place]  Basic Metabolic Panel: Recent Labs  Lab 09/20/18 1029 09/23/18 0839 09/24/18 0610 09/26/18 0507  NA 143 144 142 140  K 2.6* 3.5 3.2* 3.1*  CL 114* 119* 116* 115*  CO2 19* 19* 20* 21*  GLUCOSE 134* 79 80 98  BUN 16 28* 34* 16  CREATININE 0.92 0.93 0.75 0.56  CALCIUM 8.6* 8.4* 7.8* 7.7*  MG  --   --   --  2.0   GFR Estimated Creatinine Clearance: 39 mL/min (by C-G formula based on SCr of 0.56 mg/dL). Liver Function Tests: Recent Labs  Lab 09/20/18 1029 09/23/18 0839 09/24/18 0610 09/26/18 0507  AST 86* 85* 82* 64*  ALT 79* 78* 71* 58*  ALKPHOS  1,566* 1,382* 1,082* 950*  BILITOT 11.2* 9.8* 8.6* 7.0*  PROT 6.8 6.6 6.4* 5.9*  ALBUMIN 2.3* 2.2* 2.2* 2.1*   No results for input(s): LIPASE, AMYLASE in the last 168 hours. No results for input(s): AMMONIA in the last 168 hours. Coagulation profile Recent Labs  Lab 09/25/18 1039 09/26/18 0507  INR 2.37 1.39    CBC: Recent Labs  Lab 09/20/18 1029 09/23/18 0839 09/24/18 0610 09/26/18 0507  WBC 9.6 10.1 7.6 6.9  NEUTROABS 8.5* 9.0*  --   --   HGB 7.2* 7.0* 10.5* 9.8*  HCT 20.4* 19.8* 30.9* 29.4*  MCV 102.0* 103.1* 94.8 99.0  PLT 190 192 155 125*   Cardiac Enzymes: No results for input(s): CKTOTAL, CKMB, CKMBINDEX, TROPONINI in the last 168 hours. BNP: Invalid input(s): POCBNP CBG: No results for input(s): GLUCAP in the last 168 hours. D-Dimer No results for input(s): DDIMER in the last 72 hours. Hgb A1c No results for input(s): HGBA1C in the last 72 hours. Lipid Profile No results for input(s): CHOL, HDL, LDLCALC, TRIG, CHOLHDL, LDLDIRECT in the last 72 hours. Thyroid function studies No results for input(s): TSH, T4TOTAL, T3FREE, THYROIDAB in the last 72 hours.  Invalid input(s): FREET3 Anemia work  up No results for input(s): VITAMINB12, FOLATE, FERRITIN, TIBC, IRON, RETICCTPCT in the last 72 hours. Microbiology No results found for this or any previous visit (from the past 240 hour(s)).    Studies:  Dg C-arm 1-60 Min-no Report  Result Date: 09/26/2018 Fluoroscopy was utilized by the requesting physician.  No radiographic interpretation.    Assessment: 68 y.o. female   1. Anemia, secondary to underline malignancy, no overt bleeding  2. Obstructive jaundice secondary to pancreatic mass  3. Metastatic pancreatic cancer, stage IV, s/p multiple line chemo, off treatment now  4. Anorexia and weight loss  5. DNR/DNI    Plan:  -ERCP was not successful.  I discussed the option of percutaneous biliary drainage by IR.  The potential benefit and possible  complications were discussed with patient and her husband, especially the risk of dehydration due to high output, infection, leakage, and some discomfort, and the management of the drain bag.  -She has had multiple lines of chemotherapy, no more standard chemotherapy is available.  I feel that the benefit of PTC is limited, and I do not strongly recommend it. It certainly will improve her hyperbilirubinemia, but I am not sure if will improve her symptoms dramatically, or prolong her life. -Discussed the option of comfort care alone and hospice, patient will think about it.  She was still quite drowsy after procedure, husband was open to hospice, but does not feel safe for her to go home with hospice and lives alone at home when he goes out for dialysis. -I spoke to patient's brother Dr. Laurann Montana on the phone, he is coming tomorrow night, and would like discuss PTC and hospice with pt in person. He feels pt may need short term SNF. -After a lengthy discussion, pt and her husband agree to hold PTC for now, until discuss with her brother tomorrow night and Saturday. -will ask PT to see pt tomorrow to see if she can go to SNF -I will f/u tomorrow   Truitt Merle, MD 09/24/2018

## 2018-09-26 NOTE — Interval H&P Note (Signed)
History and Physical Interval Note:  09/26/2018 11:13 AM  Tammy Adams  has presented today for surgery, with the diagnosis of pancreatic cancer  The various methods of treatment have been discussed with the patient and family. After consideration of risks, benefits and other options for treatment, the patient has consented to  Procedure(s): ENDOSCOPIC RETROGRADE CHOLANGIOPANCREATOGRAPHY (ERCP) WITH PROPOFOL (N/A) as a surgical intervention .  The patient's history has been reviewed, patient examined, no change in status, stable for surgery.  I have reviewed the patient's chart and labs.  Questions were answered to the patient's satisfaction.     Milus Banister

## 2018-09-26 NOTE — Op Note (Addendum)
[facility] Patient Name: Tammy Adams Procedure Date: 09/26/2018 MRN: [id number] Attending MD: Milus Banister , MD Date of Birth: July 22, 1951 CSN: [id number] Age: 68 Admit Type: Inpatient Procedure:                ERCP Indications:              Malignant stricture of the common bile duct Providers:                Milus Banister, MD, Burtis Junes, RN, Cherylynn Ridges,                            Technician, Heide Scales, CRNA Referring MD:             Truitt Merle, MD Medicines:                General Anesthesia, Indomethacin 100 mg PR, Cipro                            333 mg IV Complications:            No immediate complications. Estimated blood loss:                            None Estimated Blood Loss:     Estimated blood loss: none. Procedure:                Pre-Anesthesia Assessment:                           - Prior to the procedure, a History and Physical                            was performed, and patient medications and                            allergies were reviewed. The patient's tolerance of                            previous anesthesia was also reviewed. The risks                            and benefits of the procedure and the sedation                            options and risks were discussed with the patient.                            All questions were answered, and informed consent                            was obtained. Prior Anticoagulants: The patient has                            taken no previous anticoagulant or antiplatelet  agents. ASA Grade Assessment: IV - A patient with                            severe systemic disease that is a constant threat                            to life. After reviewing the risks and benefits,                            the patient was deemed in satisfactory condition to                            undergo the procedure.                           After obtaining informed consent, the  scope was                            passed under direct vision. Throughout the                            procedure, the patient's blood pressure, pulse, and                            oxygen saturations were monitored continuously. The                            TJF-Q180V ([id number] Olympus duodenoscope was                            introduced through the mouth, and used to inject                            contrast into and used to cannulate the bile duct.                            The ERCP was accomplished without difficulty. The                            patient tolerated the procedure well. Scope In: Scope Out: Findings:      The scout film was normal. The duodenoscope was advanced to the region       of the major papilla. There was anatomic deformity of the duodenum,       periampulla. The periampullary duodenum was bulging inward, I suspect       from the large cystic tumor in the head of pancreas. I was only able to       approach the ampulla from a "long"position. I was able to cannulate the       pancreatic duct with the .035 hydrawire within a 25 [employer] however I       could not cannulate the bile duct. After 45-50 minutes attempt, the       procedure was aborted. The pancreatic duct was cannulated with the wire       but never  injected with dye. Impression:               - Anatomic deformity of the duodenum, periampulla                            which I suspect is from the large cystic tumor in                            the head of the pancreas.                           - I was unable to cannulate the bile duct. Moderate Sedation:      Not Applicable - Patient had care per Anesthesia. Recommendation:           - Return patient to hospital ward for ongoing care.                           - I will discuss IR PTC method to decompress her                            biliary obstruction. Procedure Code(s):        --- Professional ---                           [phone],  Endoscopic retrograde                            cholangiopancreatography (ERCP); diagnostic,                            including collection of specimen(s) by brushing or                            washing, when performed (separate procedure) Diagnosis Code(s):        --- Professional ---                           K83.1, Obstruction of bile duct CPT copyright 2018 American Medical Association. All rights reserved. The codes documented in this report are preliminary and upon coder review may  be revised to meet current compliance requirements. Milus Banister, MD 09/26/2018 12:55:49 PM This report has been signed electronically. Number of Addenda: 0

## 2018-09-26 NOTE — Transfer of Care (Signed)
Immediate Anesthesia Transfer of Care Note  Patient: Tammy Adams  Procedure(s) Performed: Procedure(s): ENDOSCOPIC RETROGRADE CHOLANGIOPANCREATOGRAPHY (ERCP) WITH PROPOFOL (N/A)  Patient Location: PACU  Anesthesia Type:General  Level of Consciousness: Patient easily awoken, sedated, comfortable, cooperative, following commands, responds to stimulation.   Airway & Oxygen Therapy: Patient spontaneously breathing, ventilating well, oxygen via simple oxygen mask.  Post-op Assessment: Report given to PACU RN, vital signs reviewed and stable, moving all extremities.   Post vital signs: Reviewed and stable.  Complications: No apparent anesthesia complications  Last Vitals:  Vitals Value Taken Time  BP 155/91 09/26/2018  1:15 PM  Temp    Pulse 62 09/26/2018  1:17 PM  Resp 13 09/26/2018  1:17 PM  SpO2 100 % 09/26/2018  1:17 PM  Vitals shown include unvalidated device data.  Last Pain:  Vitals:   09/26/18 1037  TempSrc: Oral  PainSc: 0-No pain      Patients Stated Pain Goal: 0 (65/78/46 9629)  Complications: No apparent anesthesia complications

## 2018-09-26 NOTE — Progress Notes (Signed)
Unsuccessful biliary stent via ERCP.  Spoke with interventional radiology who will perform percutaneous drainage tomorrow. I also spoke with the patient and her husband and have updated them.  They are in the agreement.  Call with questions as needed

## 2018-09-26 NOTE — Anesthesia Postprocedure Evaluation (Signed)
Anesthesia Post Note  Patient: Tammy Adams  Procedure(s) Performed: ENDOSCOPIC RETROGRADE CHOLANGIOPANCREATOGRAPHY (ERCP) WITH PROPOFOL (N/A )     Anesthesia Post Evaluation  Last Vitals:  Vitals:   09/26/18 1410 09/26/18 1418  BP: 136/61   Pulse: (!) 40 (!) 45  Resp: 11 13  Temp:    SpO2: 100% 99%    Last Pain:  Vitals:   09/26/18 1315  TempSrc:   PainSc: Asleep                 Homero Hyson DANIEL

## 2018-09-26 NOTE — Anesthesia Preprocedure Evaluation (Addendum)
Anesthesia Evaluation  Patient identified by MRN, date of birth, ID band Patient awake    Reviewed: Allergy & Precautions, NPO status , Patient's Chart, lab work & pertinent test results  History of Anesthesia Complications Negative for: history of anesthetic complications  Airway Mallampati: I  TM Distance: >3 FB Neck ROM: Full    Dental  (+) Edentulous Upper, Edentulous Lower, Dental Advisory Given   Pulmonary neg pulmonary ROS, Current Smoker,    Pulmonary exam normal        Cardiovascular negative cardio ROS Normal cardiovascular exam     Neuro/Psych PSYCHIATRIC DISORDERS Anxiety negative neurological ROS     GI/Hepatic Neg liver ROS, Pancreatic Ca   Endo/Other  negative endocrine ROS  Renal/GU negative Renal ROS  negative genitourinary   Musculoskeletal negative musculoskeletal ROS (+)   Abdominal   Peds negative pediatric ROS (+)  Hematology negative hematology ROS (+)   Anesthesia Other Findings   Reproductive/Obstetrics negative OB ROS                            Anesthesia Physical Anesthesia Plan  ASA: III  Anesthesia Plan: General   Post-op Pain Management:    Induction:   PONV Risk Score and Plan: 3 and Ondansetron, Dexamethasone and Diphenhydramine  Airway Management Planned: Oral ETT  Additional Equipment:   Intra-op Plan:   Post-operative Plan: Extubation in OR  Informed Consent: I have reviewed the patients History and Physical, chart, labs and discussed the procedure including the risks, benefits and alternatives for the proposed anesthesia with the patient or authorized representative who has indicated his/her understanding and acceptance.     Dental advisory given  Plan Discussed with: CRNA and Anesthesiologist  Anesthesia Plan Comments:       Anesthesia Quick Evaluation

## 2018-09-27 ENCOUNTER — Encounter (HOSPITAL_COMMUNITY): Payer: Self-pay | Admitting: Gastroenterology

## 2018-09-27 DIAGNOSIS — Z681 Body mass index (BMI) 19 or less, adult: Secondary | ICD-10-CM

## 2018-09-27 DIAGNOSIS — R630 Anorexia: Secondary | ICD-10-CM

## 2018-09-27 DIAGNOSIS — Z7189 Other specified counseling: Secondary | ICD-10-CM

## 2018-09-27 DIAGNOSIS — Z66 Do not resuscitate: Secondary | ICD-10-CM

## 2018-09-27 DIAGNOSIS — Z515 Encounter for palliative care: Secondary | ICD-10-CM

## 2018-09-27 LAB — CBC
HEMATOCRIT: 31.7 % — AB (ref 36.0–46.0)
Hemoglobin: 10.4 g/dL — ABNORMAL LOW (ref 12.0–15.0)
MCH: 32.8 pg (ref 26.0–34.0)
MCHC: 32.8 g/dL (ref 30.0–36.0)
MCV: 100 fL (ref 80.0–100.0)
Platelets: 140 10*3/uL — ABNORMAL LOW (ref 150–400)
RBC: 3.17 MIL/uL — ABNORMAL LOW (ref 3.87–5.11)
RDW: 21.5 % — ABNORMAL HIGH (ref 11.5–15.5)
WBC: 7.6 10*3/uL (ref 4.0–10.5)
nRBC: 0 % (ref 0.0–0.2)

## 2018-09-27 LAB — COMPREHENSIVE METABOLIC PANEL
ALT: 55 U/L — ABNORMAL HIGH (ref 0–44)
AST: 64 U/L — ABNORMAL HIGH (ref 15–41)
Albumin: 2.1 g/dL — ABNORMAL LOW (ref 3.5–5.0)
Alkaline Phosphatase: 939 U/L — ABNORMAL HIGH (ref 38–126)
Anion gap: 3 — ABNORMAL LOW (ref 5–15)
BUN: 22 mg/dL (ref 8–23)
CO2: 21 mmol/L — ABNORMAL LOW (ref 22–32)
Calcium: 7.9 mg/dL — ABNORMAL LOW (ref 8.9–10.3)
Chloride: 115 mmol/L — ABNORMAL HIGH (ref 98–111)
Creatinine, Ser: 0.57 mg/dL (ref 0.44–1.00)
GFR calc Af Amer: >60 mL/min (ref >60)
GFR calc non Af Amer: >60 mL/min (ref >60)
Glucose, Bld: 85 mg/dL (ref 70–99)
POTASSIUM: 3.8 mmol/L (ref 3.5–5.1)
Sodium: 139 mmol/L (ref 135–145)
TOTAL PROTEIN: 5.9 g/dL — AB (ref 6.5–8.1)
Total Bilirubin: 7.1 mg/dL — ABNORMAL HIGH (ref 0.3–1.2)

## 2018-09-27 LAB — MAGNESIUM: Magnesium: 2 mg/dL (ref 1.7–2.4)

## 2018-09-27 MED ORDER — HYDROMORPHONE HCL 1 MG/ML IJ SOLN: 0.5000 mg | INTRAMUSCULAR | Status: DC | PRN

## 2018-09-27 NOTE — Progress Notes (Signed)
Kathlyn Leachman   DOB:1950-11-16   LE#:[id number]   [id number]  Oncology follow up   Subjective: Pt is hemodynamically stable, still very weak, has been in bed most the time, denies any significant pain, nausea, or other new discomfort.  Her appetite is decent.   Objective:  Vitals:   09/27/18 0538 09/27/18 1255  BP: 98/64 106/71  Pulse: (!) 51 66  Resp: 16   Temp: 98 F (36.7 C) 97.9 F (36.6 C)  SpO2: 98% 99%    Body mass index is 14.08 kg/m.  Intake/Output Summary (Last 24 hours) at 09/27/2018 2007 Last data filed at 09/27/2018 1854 Gross per 24 hour  Intake 595 ml  Output -  Net 595 ml     Sclerae icteric  Oropharynx clear  No peripheral adenopathy  Lungs clear -- no rales or rhonchi  Heart regular rate and rhythm  Abdomen benign  MSK no focal spinal tenderness, no peripheral edema  Neuro nonfocal    CBG (last 3)  No results for input(s): GLUCAP in the last 72 hours.   Labs:  Lab Results  Component Value Date   WBC 7.6 09/27/2018   HGB 10.4 (L) 09/27/2018   HCT 31.7 (L) 09/27/2018   MCV 100.0 09/27/2018   PLT 140 (L) 09/27/2018   NEUTROABS 9.0 (H) 09/23/2018    Urine Studies No results for input(s): UHGB, CRYS in the last 72 hours.  Invalid input(s): UACOL, UAPR, USPG, UPH, UTP, UGL, UKET, UBIL, UNIT, UROB, [place], UEPI, UWBC, Duwayne Heck [place], [place]  Basic Metabolic Panel: Recent Labs  Lab 09/23/18 0839 09/24/18 0610 09/26/18 0507 09/27/18 0531  NA 144 142 140 139  K 3.5 3.2* 3.1* 3.8  CL 119* 116* 115* 115*  CO2 19* 20* 21* 21*  GLUCOSE 79 80 98 85  BUN 28* 34* 16 22  CREATININE 0.93 0.75 0.56 0.57  CALCIUM 8.4* 7.8* 7.7* 7.9*  MG  --   --  2.0 2.0   GFR Estimated Creatinine Clearance: 39 mL/min (by C-G formula based on SCr of 0.57 mg/dL). Liver Function Tests: Recent Labs  Lab 09/23/18 0839 09/24/18 0610 09/26/18 0507 09/27/18 0531  AST 85* 82* 64* 64*  ALT 78* 71* 58* 55*  ALKPHOS 1,382* 1,082* 950* 939*  BILITOT 9.8* 8.6*  7.0* 7.1*  PROT 6.6 6.4* 5.9* 5.9*  ALBUMIN 2.2* 2.2* 2.1* 2.1*   No results for input(s): LIPASE, AMYLASE in the last 168 hours. No results for input(s): AMMONIA in the last 168 hours. Coagulation profile Recent Labs  Lab 09/25/18 1039 09/26/18 0507  INR 2.37 1.39    CBC: Recent Labs  Lab 09/23/18 0839 09/24/18 0610 09/26/18 0507 09/27/18 0531  WBC 10.1 7.6 6.9 7.6  NEUTROABS 9.0*  --   --   --   HGB 7.0* 10.5* 9.8* 10.4*  HCT 19.8* 30.9* 29.4* 31.7*  MCV 103.1* 94.8 99.0 100.0  PLT 192 155 125* 140*   Cardiac Enzymes: No results for input(s): CKTOTAL, CKMB, CKMBINDEX, TROPONINI in the last 168 hours. BNP: Invalid input(s): POCBNP CBG: No results for input(s): GLUCAP in the last 168 hours. D-Dimer No results for input(s): DDIMER in the last 72 hours. Hgb A1c No results for input(s): HGBA1C in the last 72 hours. Lipid Profile No results for input(s): CHOL, HDL, LDLCALC, TRIG, CHOLHDL, LDLDIRECT in the last 72 hours. Thyroid function studies No results for input(s): TSH, T4TOTAL, T3FREE, THYROIDAB in the last 72 hours.  Invalid input(s): FREET3 Anemia work up No results for input(s):  VITAMINB12, FOLATE, FERRITIN, TIBC, IRON, RETICCTPCT in the last 72 hours. Microbiology No results found for this or any previous visit (from the past 240 hour(s)).    Studies:  Dg C-arm 1-60 Min-no Report  Result Date: 09/26/2018 Fluoroscopy was utilized by the requesting physician.  No radiographic interpretation.    Assessment: 68 y.o. female   1. Anemia, secondary to underline malignancy, no overt bleeding, improved with blood transfusion   2. Obstructive jaundice secondary to pancreatic mass  3. Metastatic pancreatic cancer, stage IV, s/p multiple line chemo, off treatment now  4. Anorexia and weight loss  5. DNR/DNI    Plan:  -Pt is very weak, declined PT evaluation, will not be eligible for SNF -due to her rapidly declining performance status, hospice is the best  option for her. Due to her limited support at home (husband is on dialysis), and her needs for total care, we recommend residential hospice [facility]. Appreciate Dr. Inda Castle input and assistance on her care transition -I spoke with pt, her husband and her brother Dr. Laurann Montana (around 8pm after he arrived and met pt tonight), they are all in agreement with comfort care and residential hospice arrangement -Dr. Rowe Pavy will meet them again in the morning  -I appreciate the excellent care from Drs. Reesa Chew and Rowe Pavy to this lovely lady.    Truitt Merle, MD 09/27/2018 8:14 PM

## 2018-09-27 NOTE — Consult Note (Signed)
Consultation Note Date: 09/27/2018   Patient Name: Tammy Adams  DOB: 06/29/1951  MRN: [id number]  Age / Sex: 68 y.o., female  PCP: ViaLennette Bihari, MD Referring Physician: Damita Lack, MD  Reason for Consultation: Establishing goals of care  HPI/Patient Profile: 68 y.o. female    admitted on 09/23/2018    Clinical Assessment and Goals of Care:  68 year old with a history of extensively metastatic pancreatic cancer stage IV came to the hospital for evaluation of progressive weakness and dizziness.  Recent MRI had shown increase in size of abdominal mass outpatient and case was discussed with gastroenterology and there were initial plans to get outpatient stent placement/ERCP but in the meantime patient worsened therefore came to the hospital.  Patient underwent ERCP on 2/6 for malignant stricture of common bile duct, unable to cannulate bile duct due to likely large tumor head of pancreas. She was then seen by IR and initially plans were being made to consider percutaneous drain placement.   After further discussions, patient decided not to proceed with IR percutaneous drain placement.  A palliative consult has been placed for ongoing goals of care discussions.   The patient is resting in bed, she attempted to take a few bites of her food. I introduced myself and palliative care as follows: Palliative medicine is specialized medical care for people living with serious illness. It focuses on providing relief from the symptoms and stress of a serious illness. The goal is to improve quality of life for both the patient and the family.   The patient stated that she did get some rest last night. At times, she is having some abdominal discomfort. Later on, her husband arrived at the bedside.   The patient did not wish to participate with PT. Husband noted that it is most difficult for her to be out of bed.   Goals,  wishes and values attempted to be elicited. Hospice philosophy of care discussed in detail. Difference between home with hospice versus residential hospice discussed in detail.   The patient prefers to go home. How ever, the patient's husband has to go for his dialysis appointments 3 times weekly, this would leave the patient unsupervised. She is declining rapidly, not eating much and may have ongoing symptom management needs. Hence, we reviewed about how a residential hospice setting might be beneficial.   Patient will discuss further with her family, please note additional discussions and recommendations as listed below.   NEXT OF KIN  husband, they have known each other a long time, married since 1999. She has a daughter who lives in [place].  She has a brother who is a Engineer, drilling in [place].   SUMMARY OF RECOMMENDATIONS    DNR DNI Hospice services discussed in detail. Family to hold further discussions this evening after brother arrives from out of town. Ongoing discussions about pros and cons of home with hospice versus residential hospice.  Continue current mode of care. Augment pain and non pain symptom management regimen PMT to follow. Discussed with med onc  as well as TR[facility] MD.  Thank you for the consult.   Code Status/Advance Care Planning:  DNR    Symptom Management:    will add low dose IV Dilaudid for pain.   Continue current ativan and remeron.   Palliative Prophylaxis:   Bowel Regimen   Psycho-social/Spiritual:   Desire for further Chaplaincy support:yes  Additional Recommendations: Education on Hospice  Prognosis:   < 2 weeks  Discharge Planning: To Be Determined      Primary Diagnoses: Present on Admission: . Anemia . Pancreatic cancer metastasized to liver [facility])   I have reviewed the medical record, interviewed the patient and family, and examined the patient. The following aspects are pertinent.  Past Medical History:  Diagnosis Date  .  Anxiety   . Family history of colon cancer   . Family history of pancreatic cancer   . Family history of prostate cancer   . pancreatic ca dx'd 06/2017   Social History   Socioeconomic History  . Marital status: Married    Spouse name: Not on file  . Number of children: Not on file  . Years of education: Not on file  . Highest education level: Not on file  Occupational History  . Not on file  Social Needs  . Financial resource strain: Not on file  . Food insecurity:    Worry: Not on file    Inability: Not on file  . Transportation needs:    Medical: Not on file    Non-medical: Not on file  Tobacco Use  . Smoking status: Current Every Day Smoker    Packs/day: 0.50    Years: 50.00    Pack years: 25.00  . Smokeless tobacco: Never Used  Substance and Sexual Activity  . Alcohol use: No    Frequency: Never  . Drug use: Yes    Types: Marijuana  . Sexual activity: Not on file  Lifestyle  . Physical activity:    Days per week: Not on file    Minutes per session: Not on file  . Stress: Not on file  Relationships  . Social connections:    Talks on phone: Not on file    Gets together: Not on file    Attends religious service: Not on file    Active member of club or organization: Not on file    Attends meetings of clubs or organizations: Not on file    Relationship status: Not on file  Other Topics Concern  . Not on file  Social History Narrative  . Not on file   Family History  Problem Relation Age of Onset  . Prostate cancer Father        d. 63  . Pancreatic cancer Maternal Aunt 70  . Colon cancer Maternal Grandmother        dx in her 52s  . Prostate cancer Maternal Uncle   . Lung cancer Cousin   . Cancer Cousin        unknown type  . Cancer Cousin 45       daughter of cousin with lung cancer   Scheduled Meds: . sodium chloride   Intravenous Once  . furosemide  20 mg Intravenous Once  . lactose free nutrition  237 mL Oral TID WC  . lipase/protease/amylase   36,000 Units Oral TID AC  . LORazepam  1 mg Oral QHS  . mirtazapine  15 mg Oral QHS  . phytonadione  10 mg Subcutaneous Daily   Continuous Infusions: . sodium  chloride 75 mL/hr at 09/27/18 0700  .  ceFAZolin (ANCEF) IV     PRN Meds:. Medications Prior to Admission:  Prior to Admission medications   Medication Sig Start Date End Date Taking? Authorizing Provider  diphenoxylate-atropine (LOMOTIL) 2.5-0.025 MG tablet Take 1-2 tablets by mouth 4 (four) times daily as needed for diarrhea or loose stools. 08/15/18  Yes Truitt Merle, MD  dronabinol (MARINOL) 2.5 MG capsule TAKE 1 CAPSULE BY MOUTH TWICE DAILY BEFORE MEAL(S) Patient taking differently: Take 2.5 mg by mouth 2 (two) times daily before a meal. TAKE 1 CAPSULE BY MOUTH TWICE DAILY BEFORE MEAL(S) 08/15/18  Yes Truitt Merle, MD  LORazepam (ATIVAN) 1 MG tablet Take 1 mg by mouth at bedtime.    Yes [provider]  mirtazapine (REMERON) 15 MG tablet TAKE 1 TABLET BY MOUTH AT BEDTIME Patient taking differently: Take 15 mg by mouth at bedtime.  09/19/18  Yes Truitt Merle, MD  Multiple Vitamin (MULTIVITAMIN WITH MINERALS) TABS tablet Take 1 tablet daily by mouth. One-A-Day for Women   Yes [provider]  Pancrelipase, Lip-Prot-Amyl, (CREON) 24000-76000 units CPEP Take 1 capsule (24,000 Units total) by mouth 3 (three) times daily before meals. 07/03/18  Yes Alla Feeling, NP  potassium chloride SA (K-DUR,KLOR-CON) 20 MEQ tablet Take 1 tablet (20 mEq total) by mouth 2 (two) times daily. Take 4 tablets (20 meq) today, then take one tablet twice daily. Patient taking differently: Take 20 mEq by mouth daily.  09/20/18  Yes Truitt Merle, MD  capecitabine (XELODA) 500 MG tablet Take 2 tablets (1000mg ) by mouth in AM & 1 tab (500mg ) in PM, immediately after food. Take for 14 days on, 7 days off, repeated every 21 d Patient not taking: Reported on 09/23/2018 07/29/18   Truitt Merle, MD  prochlorperazine (COMPAZINE) 10 MG tablet Take 1 tablet (10 mg  total) by mouth every 6 (six) hours as needed (Nausea or vomiting). 07/23/17 02/04/18  Truitt Merle, MD   No Known Allergies Review of Systems +weakness +anxiety  Physical Exam Frail weak appearing lady In mild distress Awake alert Responds appropriately Diminished breath sounds at bases  S1 S 2  Has some mild abd pain Non focal Thin with muscle wasting.   Vital Signs: BP 106/71 (BP Location: Right Arm)   Pulse 66   Temp 97.9 F (36.6 C) (Axillary)   Resp 16   Ht 5\' 4"  (1.626 m)   Wt 37.2 kg   SpO2 99%   BMI 14.08 kg/m  Pain Scale: 0-10   Pain Score: 0-No pain   SpO2: SpO2: 99 % O2 Device:SpO2: 99 % O2 Flow Rate: .O2 Flow Rate (L/min): 3 L/min  IO: Intake/output summary:   Intake/Output Summary (Last 24 hours) at 09/27/2018 1556 Last data filed at 09/27/2018 1000 Gross per 24 hour  Intake 721.25 ml  Output 2 ml  Net 719.25 ml    LBM: Last BM Date: 09/27/18 Baseline Weight: Weight: 37.4 kg Most recent weight: Weight: 37.2 kg     Palliative Assessment/Data:   Flowsheet Rows     Most Recent Value  Intake Tab  Referral Department  Hospitalist  Unit at Time of Referral  Oncology Unit  Palliative Care Primary Diagnosis  Cancer  Date Notified  09/26/18  Palliative Care Type  New Palliative care  Reason for referral  Clarify Goals of Care  Date of Admission  09/23/18  # of days IP prior to Palliative referral  3  Clinical Assessment  Psychosocial & Spiritual  Assessment  Palliative Care Outcomes    PPS 30%  Time In:  11  Time Out:  12.10 Time Total:  70 min  Greater than 50%  of this time was spent counseling and coordinating care related to the above assessment and plan.  Signed by: Loistine Chance, MD  [phone] Please contact Palliative Medicine Team phone at [phone] for questions and concerns.  For individual provider: See Shea Evans

## 2018-09-27 NOTE — Progress Notes (Signed)
OT Cancellation Note  Patient Details Name: Tammy Adams MRN: [id number] DOB: 10-15-1950   Cancelled Treatment:    Reason Eval/Treat Not Completed: Other (comment). Pt does not feel up to OOB this pm.  Will either check back over the weekend or Monday as schedule permits.  Dagmar Adcox 09/27/2018, 1:33 PM  Lesle Chris, OTR/L Acute Rehabilitation Services [phone] [facility] pager [phone] office 09/27/2018

## 2018-09-27 NOTE — Progress Notes (Signed)
PROGRESS NOTE    Tammy Adams  ZOX:[id number] DOB: [birth year] DOA: 09/23/2018 PCP: Dineen Kid, MD   Brief Narrative:  68 year old with a history of extensively metastatic pancreatic cancer stage IV came to the hospital for evaluation of progressive weakness and dizziness.  Recent MRI had shown increase in size of abdominal mass outpatient and case was discussed with gastroenterology and there were initial plans to get outpatient stent placement/ERCP but in the meantime patient worsened therefore came to the hospital.  Patient underwent ERCP on 2/6 but due to the anatomy of the malignancy he was difficult to place stent therefore case would be discussed by GI with IR.   Assessment & Plan:   Principal Problem:   Pancreatic cancer metastasized to liver [facility]) Active Problems:   Anemia   Protein-calorie malnutrition, severe   Common bile duct (CBD) stricture  Stage IV metastatic pancreatic adenocarcinoma with obstructive jaundice -Chemotherapy plans per oncology. -Difficult ERCP.  After discussing the case with oncology it was determined to hold off on percutaneous drainage. Palliative care consulted-plans for residential versus home hospice. -Diet as tolerated -Supportive care.  Monitor LFTs.  Anorexia with failure to thrive Severe protein calorie malnutrition Anemia of chronic disease -Continue to encourage oral diet   DVT prophylaxis: SCDs Code Status: DNR Family Communication: Husband is at the bedside Disposition Plan: Maintain inpatient stay until we have safe disposition plan for her  Consultants:   Gastroenterology  Oncology  Procedures:   None  Antimicrobials:   None   Subjective: Patient still appears extremely weak.  Continues to have off-and-on watery diarrhea.  Having difficult time tolerating oral diet and has very poor desire to eat.  Husband is at the bedside.  Review of Systems Otherwise negative except as per HPI, including: General = no fevers,  chills, dizziness, malaise, fatigue HEENT/EYES = negative for pain, redness, loss of vision, double vision, blurred vision, loss of hearing, sore throat, hoarseness, dysphagia Cardiovascular= negative for chest pain, palpitation, murmurs, lower extremity swelling Respiratory/lungs= negative for shortness of breath, cough, hemoptysis, wheezing, mucus production Gastrointestinal= negative for nausea, vomiting,, abdominal pain, melena, hematemesis Genitourinary= negative for Dysuria, Hematuria, Change in Urinary Frequency MSK = Negative for arthralgia, myalgias, Back Pain, Joint swelling  Neurology= Negative for headache, seizures, numbness, tingling  Psychiatry= Negative for anxiety, depression, suicidal and homocidal ideation Allergy/Immunology= Medication/Food allergy as listed  Skin= Negative for Rash, lesions, ulcers, itching    Objective: Vitals:   09/26/18 1410 09/26/18 1418 09/26/18 2148 09/27/18 0538  BP: 136/61  110/70 98/64  Pulse: (!) 40 (!) 45 (!) 51 (!) 51  Resp: 11 13 20 16   Temp:   98.2 F (36.8 C) 98 F (36.7 C)  TempSrc:   Oral Oral  SpO2: 100% 99% 100% 98%  Weight:      Height:        Intake/Output Summary (Last 24 hours) at 09/27/2018 1123 Last data filed at 09/27/2018 1000 Gross per 24 hour  Intake 1521.25 ml  Output 2 ml  Net 1519.25 ml   Filed Weights   09/23/18 1834 09/26/18 1037  Weight: 37.4 kg 37.2 kg    Examination:  Constitutional: NAD, calm, comfortable, extremely frail and weak appearing with bilateral temporal wasting Eyes: PERRL, lids and conjunctivae normal ENMT: Mucous membranes are moist. Posterior pharynx clear of any exudate or lesions.Normal dentition.  Neck: normal, supple, no masses, no thyromegaly Respiratory: Diminished breath sounds at the bases Cardiovascular: Regular rate and rhythm, no murmurs / rubs / gallops. No extremity  edema. 2+ pedal pulses. No carotid bruits.  Abdomen: no tenderness, no masses palpated. No  hepatosplenomegaly. Bowel sounds positive.  Musculoskeletal: no clubbing / cyanosis. No joint deformity upper and lower extremities. Good ROM, no contractures. Normal muscle tone.  Skin: no rashes, lesions, ulcers. No induration Neurologic: CN 2-12 grossly intact. Sensation intact, DTR normal. Strength 4/5 in all 4.  Psychiatric: Appears slightly depressed given her poor prognosis which is understandable.  Otherwise she is alert awake oriented X3.  Asking appropriate questions.    Data Reviewed:   CBC: Recent Labs  Lab 09/23/18 0839 09/24/18 0610 09/26/18 0507 09/27/18 0531  WBC 10.1 7.6 6.9 7.6  NEUTROABS 9.0*  --   --   --   HGB 7.0* 10.5* 9.8* 10.4*  HCT 19.8* 30.9* 29.4* 31.7*  MCV 103.1* 94.8 99.0 100.0  PLT 192 155 125* 357*   Basic Metabolic Panel: Recent Labs  Lab 09/23/18 0839 09/24/18 0610 09/26/18 0507 09/27/18 0531  NA 144 142 140 139  K 3.5 3.2* 3.1* 3.8  CL 119* 116* 115* 115*  CO2 19* 20* 21* 21*  GLUCOSE 79 80 98 85  BUN 28* 34* 16 22  CREATININE 0.93 0.75 0.56 0.57  CALCIUM 8.4* 7.8* 7.7* 7.9*  MG  --   --  2.0 2.0   GFR: Estimated Creatinine Clearance: 39 mL/min (by C-G formula based on SCr of 0.57 mg/dL). Liver Function Tests: Recent Labs  Lab 09/23/18 0839 09/24/18 0610 09/26/18 0507 09/27/18 0531  AST 85* 82* 64* 64*  ALT 78* 71* 58* 55*  ALKPHOS 1,382* 1,082* 950* 939*  BILITOT 9.8* 8.6* 7.0* 7.1*  PROT 6.6 6.4* 5.9* 5.9*  ALBUMIN 2.2* 2.2* 2.1* 2.1*   No results for input(s): LIPASE, AMYLASE in the last 168 hours. No results for input(s): AMMONIA in the last 168 hours. Coagulation Profile: Recent Labs  Lab 09/25/18 1039 09/26/18 0507  INR 2.37 1.39   Cardiac Enzymes: No results for input(s): CKTOTAL, CKMB, CKMBINDEX, TROPONINI in the last 168 hours. BNP (last 3 results) No results for input(s): PROBNP in the last 8760 hours. HbA1C: No results for input(s): HGBA1C in the last 72 hours. CBG: No results for input(s): GLUCAP  in the last 168 hours. Lipid Profile: No results for input(s): CHOL, HDL, LDLCALC, TRIG, CHOLHDL, LDLDIRECT in the last 72 hours. Thyroid Function Tests: No results for input(s): TSH, T4TOTAL, FREET4, T3FREE, THYROIDAB in the last 72 hours. Anemia Panel: No results for input(s): VITAMINB12, FOLATE, FERRITIN, TIBC, IRON, RETICCTPCT in the last 72 hours. Sepsis Labs: No results for input(s): PROCALCITON, LATICACIDVEN in the last 168 hours.  No results found for this or any previous visit (from the past 240 hour(s)).       Radiology Studies: Dg C-arm 1-60 Min-no Report  Result Date: 09/26/2018 Fluoroscopy was utilized by the requesting physician.  No radiographic interpretation.        Scheduled Meds: . sodium chloride   Intravenous Once  . furosemide  20 mg Intravenous Once  . lactose free nutrition  237 mL Oral TID WC  . lipase/protease/amylase  36,000 Units Oral TID AC  . LORazepam  1 mg Oral QHS  . mirtazapine  15 mg Oral QHS  . phytonadione  10 mg Subcutaneous Daily   Continuous Infusions: . sodium chloride 75 mL/hr at 09/27/18 0700  .  ceFAZolin (ANCEF) IV       LOS: 3 days   Time spent= 35 mins    Virgene Tirone Arsenio Loader, MD [facility]  If 7PM-7AM, please contact night-coverage [email or web address] 09/27/2018, 11:23 AM

## 2018-09-27 NOTE — Progress Notes (Signed)
Vitamin K held at this time per Palliative MD, Rowe Pavy.

## 2018-09-27 NOTE — Progress Notes (Signed)
PT Cancellation Note  Patient Details Name: Jacci Ruberg MRN: [id number] DOB: 07-May-1951   Cancelled Treatment:     PT order received but eval deferred - pt declined participation 2* fatigue having just used BSC.  Will follow.   Ranvir Renovato 09/27/2018, 10:12 AM

## 2018-09-27 NOTE — Care Management Important Message (Signed)
Important Message  Patient Details  Name: Tammy Adams MRN: [id number] Date of Birth: 1951/07/05   Medicare Important Message Given:  Yes    Kerin Salen 09/27/2018, 10:37 AMImportant Message  Patient Details  Name: Tammy Adams MRN: [id number] Date of Birth: 19-Sep-1950   Medicare Important Message Given:  Yes    Kerin Salen 09/27/2018, 10:36 AM

## 2018-09-27 NOTE — Progress Notes (Signed)
OT Cancellation Note  Patient Details Name: Tammy Adams MRN: [id number] DOB: Jul 16, 1951   Cancelled Treatment:    Reason Eval/Treat Not Completed: Other (comment) Checked on pt this am after she had just gotten back to bed from using 3:1 commode. Will reattempt later as schedule permits. [place] 09/27/2018, 10:42 AM  Lesle Chris, OTR/L Acute Rehabilitation Services [phone] [facility] pager [phone] office 09/27/2018

## 2018-09-28 LAB — COMPREHENSIVE METABOLIC PANEL
ALT: 53 U/L — ABNORMAL HIGH (ref 0–44)
AST: 45 U/L — ABNORMAL HIGH (ref 15–41)
Albumin: 2.1 g/dL — ABNORMAL LOW (ref 3.5–5.0)
Alkaline Phosphatase: 875 U/L — ABNORMAL HIGH (ref 38–126)
Anion gap: 4 — ABNORMAL LOW (ref 5–15)
BUN: 20 mg/dL (ref 8–23)
CHLORIDE: 118 mmol/L — AB (ref 98–111)
CO2: 19 mmol/L — AB (ref 22–32)
Calcium: 7.9 mg/dL — ABNORMAL LOW (ref 8.9–10.3)
Creatinine, Ser: 0.68 mg/dL (ref 0.44–1.00)
GFR calc Af Amer: >60 mL/min (ref >60)
GFR calc non Af Amer: >60 mL/min (ref >60)
Glucose, Bld: 93 mg/dL (ref 70–99)
Potassium: 3.1 mmol/L — ABNORMAL LOW (ref 3.5–5.1)
Sodium: 141 mmol/L (ref 135–145)
Total Bilirubin: 6.1 mg/dL — ABNORMAL HIGH (ref 0.3–1.2)
Total Protein: 5.8 g/dL — ABNORMAL LOW (ref 6.5–8.1)

## 2018-09-28 LAB — CBC
HCT: 32 % — ABNORMAL LOW (ref 36.0–46.0)
HEMOGLOBIN: 10.3 g/dL — AB (ref 12.0–15.0)
MCH: 33.3 pg (ref 26.0–34.0)
MCHC: 32.2 g/dL (ref 30.0–36.0)
MCV: 103.6 fL — ABNORMAL HIGH (ref 80.0–100.0)
Platelets: 138 10*3/uL — ABNORMAL LOW (ref 150–400)
RBC: 3.09 MIL/uL — AB (ref 3.87–5.11)
RDW: 21.7 % — ABNORMAL HIGH (ref 11.5–15.5)
WBC: 5.9 10*3/uL (ref 4.0–10.5)
nRBC: 0 % (ref 0.0–0.2)

## 2018-09-28 LAB — MAGNESIUM: Magnesium: 2 mg/dL (ref 1.7–2.4)

## 2018-09-28 MED ORDER — POTASSIUM CHLORIDE 10 MEQ/100ML IV SOLN
10.0000 meq | INTRAVENOUS | Status: AC
  Administered 2018-09-28 (×4): 10 meq via INTRAVENOUS
  Filled 2018-09-28 (×4): qty 100

## 2018-09-28 NOTE — Progress Notes (Signed)
PT NOTE:   Reason Eval/Treat Not Completed: Other (comment).  Noted pt comfort care. Will sign off.

## 2018-09-28 NOTE — Progress Notes (Signed)
Daily Progress Note   Patient Name: Tammy Adams       Date: 09/28/2018 DOB: [birth year]  Age: 68 y.o. MRN#: [id number] Attending Physician: Damita Lack, MD Primary Care Physician: Dineen Kid, MD Admit Date: 09/23/2018  Reason for Consultation/Follow-up: Establishing goals of care  Subjective:  patient is resting in bed Appears with weakness, appears chronically ill Did not rest well overnight.  In mild distress  Length of Stay: 4  Current Medications: Scheduled Meds:  . sodium chloride   Intravenous Once  . furosemide  20 mg Intravenous Once  . lactose free nutrition  237 mL Oral TID WC  . lipase/protease/amylase  36,000 Units Oral TID AC  . LORazepam  1 mg Oral QHS  . mirtazapine  15 mg Oral QHS  . phytonadione  10 mg Subcutaneous Daily    Continuous Infusions: . sodium chloride 75 mL/hr at 09/27/18 0700  . potassium chloride      PRN Meds: HYDROmorphone (DILAUDID) injection  Physical Exam         Awake alert Some abdominal discomfort S1 S 2 Shallow decreased breath sounds No edema Thin with muscle wasting  Vital Signs: BP 120/66 (BP Location: Left Arm)   Pulse 70   Temp 97.8 F (36.6 C) (Oral)   Resp 16   Ht 5\' 4"  (1.626 m)   Wt 37.2 kg   SpO2 100%   BMI 14.08 kg/m  SpO2: SpO2: 100 % O2 Device: O2 Device: Room Air O2 Flow Rate: O2 Flow Rate (L/min): 3 L/min  Intake/output summary:   Intake/Output Summary (Last 24 hours) at 09/28/2018 4656 Last data filed at 09/27/2018 1854 Gross per 24 hour  Intake 375 ml  Output -  Net 375 ml   LBM: Last BM Date: 09/28/18 Baseline Weight: Weight: 37.4 kg Most recent weight: Weight: 37.2 kg       Palliative Assessment/Data: PPS 30%   Flowsheet Rows     Most Recent Value  Intake Tab  Referral Department   Hospitalist  Unit at Time of Referral  Oncology Unit  Palliative Care Primary Diagnosis  Cancer  Date Notified  09/26/18  Palliative Care Type  New Palliative care  Reason for referral  Clarify Goals of Care  Date of Admission  09/23/18  # of days IP prior to Palliative referral  3  Clinical Assessment  Psychosocial & Spiritual Assessment  Palliative Care Outcomes      Patient Active Problem List   Diagnosis Date Noted  . Palliative care by specialist   . Common bile duct (CBD) stricture   . Protein-calorie malnutrition, severe 09/25/2018  . Anemia 09/23/2018  . Genetic testing 11/28/2017  . Family history of pancreatic cancer   . Family history of prostate cancer   . Family history of colon cancer   . Goals of care, counseling/discussion 07/23/2017  . 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      Code Status:    Code Status Orders  (From admission, onward)         Start     Ordered   09/23/18 1822  Do not attempt resuscitation (DNR)  Continuous    Question Answer Comment  In the event of cardiac or respiratory ARREST Do not call a "code blue"   In the event of cardiac or respiratory ARREST Do not perform Intubation, CPR, defibrillation or ACLS   In the event of cardiac or respiratory ARREST Use medication by any route, position, wound care, and other measures to relive pain and suffering. May use oxygen, suction and manual treatment of airway obstruction as needed for comfort.      09/23/18 1821          Code Status History    This patient has a current code status but no historical code status.    Advance Directive Documentation     Most Recent Value  Type of Advance Directive  Living will  Pre-existing out of facility DNR order (yellow form or pink MOST form)  -  "MOST" Form in Place?  -       Prognosis:   < 2 weeks  Discharge Planning:  Hospice facility  Care plan was discussed with patient, brother and other extended family in the bedside.   Thank you for allowing the Palliative Medicine Team to assist in the care of this patient.   Time In: 9 Time Out: 9.35 Total Time 35 Prolonged Time Billed  no       Greater than 50%  of this time was spent counseling and coordinating care related to the above assessment and plan.  Loistine Chance, MD [phone] Please contact Palliative Medicine Team phone at [phone] for questions and concerns.

## 2018-09-28 NOTE — Progress Notes (Signed)
CSW consulted for residential hospice placemental - [place].  Sent referral to hospice liasion, Olivia Mackie, who will f/u with family.   Pricilla Holm, MSW, [place] [facility] [phone]

## 2018-09-28 NOTE — Progress Notes (Signed)
[facility]) [place] RN Visit  Received request from Dr. Loistine Chance of PMT for family interest in [facility] with request for transfer today. Chart reviewed and received report from beside RN. Met with patient and family to confirm interest and explain services. Family agreeable to transfer today. Ria Comment, [place] aware. Registration paper work completed. Dr. Truitt Merle to continue care per family request.   Please fax discharge summary to [phone].  RN please call report to [phone].  Please arrange transport for patient to arrive as early in day as possible.  Thank you, Margaretmary Eddy, RN, BSN [phone]  [place] are on [place].

## 2018-09-28 NOTE — Progress Notes (Signed)
Patient discharging to [facility]. Confirmed bed availability & faxed required docs. [facility] will be used for transport. Patient is aware of d/c plan.  RN call report: [phone].    Pricilla Holm, MSW, [place] Social Work [phone]

## 2018-09-28 NOTE — Discharge Summary (Signed)
Physician Discharge Summary  Tammy Adams WUJ:[id number] DOB: 1950-12-17 DOA: 09/23/2018  PCP: Dineen Kid, MD  Admit date: 09/23/2018 Discharge date: 09/28/2018  Admitted From: Home Disposition: Residential hospice  Recommendations for Outpatient Follow-up:  1. Follow-up with PCP as needed   Discharge Condition: Stable CODE STATUS: DNR Diet recommendation: As tolerated  Brief/Interim Summary: 68 year old with a history of extensively metastatic pancreatic cancer stage IV came to the hospital for evaluation of progressive weakness and dizziness.  Recent MRI had shown increase in size of abdominal mass outpatient and case was discussed with gastroenterology and there were initial plans to get outpatient stent placement/ERCP but in the meantime patient worsened therefore came to the hospital.  Patient underwent ERCP on 2/6 but due to the anatomy of the malignancy he was difficult to place stent.  After prolonged discussion between oncology and the family was decided not to proceed with any further procedure.  Palliative care was consulted and family and patient decided to proceed with residential hospice.  Arrangements were made. Stable for discharge.   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  After discussing the case with oncology it was determined to hold off on percutaneous drainage. -Would allow comfort feeding with diet as tolerated. -Residential hospice.  Appreciate palliative care and oncology input.  Anorexia with failure to thrive Severe protein calorie malnutrition Anemia of chronic disease -Continue to encourage oral diet   DVT prophylaxis: SCDs Code Status: DNR Family Communication:  Sister and  brother at bedside Disposition Plan:  Discharge to hospice-residential  Discharge Instructions   Allergies as of 09/28/2018   No Known Allergies     Medication List    TAKE these medications   capecitabine 500 MG tablet Commonly known as:  XELODA Take 2 tablets (1000mg ) by mouth in AM & 1 tab (500mg ) in PM, immediately after food. Take for 14 days on, 7 days off, repeated every 21 d   diphenoxylate-atropine 2.5-0.025 MG tablet Commonly known as:  LOMOTIL Take 1-2 tablets by mouth 4 (four) times daily as needed for diarrhea or loose stools.   dronabinol 2.5 MG capsule Commonly known as:  MARINOL TAKE 1 CAPSULE BY MOUTH TWICE DAILY BEFORE MEAL(S) What changed:    how much to take  how to take this  when to take this   LORazepam 1 MG tablet Commonly known as:  ATIVAN Take 1 mg by mouth at bedtime.   mirtazapine 15 MG tablet Commonly known as:  REMERON TAKE 1 TABLET BY MOUTH AT BEDTIME   multivitamin with minerals Tabs tablet Take 1 tablet daily by mouth. One-A-Day for Women   Pancrelipase (Lip-Prot-Amyl) 24000-76000 units Cpep Commonly known as:  CREON Take 1 capsule (24,000 Units total) by mouth 3 (three) times daily before meals.   potassium chloride SA 20 MEQ tablet Commonly known as:  K-DUR,KLOR-CON Take 1 tablet (20 mEq total) by mouth 2 (two) times daily. Take 4 tablets (20 meq) today, then take one tablet twice daily. What changed:    when to take this  additional instructions      Follow-up Information    Via, Lennette Bihari, MD Follow up in 1 week(s).   Specialty:  Family Medicine Contact information: [place] [address] [phone]  No Known Allergies  You were cared for by a hospitalist during your hospital stay. If you have any questions about your discharge medications or the care you received while you were in the hospital after you are discharged, you can call the unit and asked to speak with the hospitalist on call  if the hospitalist that took care of you is not available. Once you are discharged, your primary care physician will handle any further medical issues. Please note that no refills for any discharge medications will be authorized once you are discharged, as it is imperative that you return to your primary care physician (or establish a relationship with a primary care physician if you do not have one) for your aftercare needs so that they can reassess your need for medications and monitor your lab values.  Consultations:  Palliative care  Oncology  Gastroenterology   Procedures/Studies: US Abdomen Complete  Result Date: 09/03/2018 CLINICAL DATA:  Pancreatic cancer metastasized to the liver. EXAM: ABDOMEN ULTRASOUND COMPLETE COMPARISON:  CT scan of July 10, 2018. FINDINGS: Gallbladder: Distended gallbladder is noted with sludge, but no cholelithiasis. No gallbladder wall thickening or pericholecystic fluid is noted. No sonographic Murphy's sign is noted. Common bile duct: Diameter: 17 mm which is consistent with significant dilatation. Liver: 1 cm predominately solid abnormality is seen in right hepatic lobe peripherally concerning for possible metastatic lesion. Severe intrahepatic biliary dilatation is noted as well. Multiple cysts of varying sizes are seen throughout hepatic parenchyma. Portal vein is patent on color Doppler imaging with normal direction of blood flow towards the liver. IVC: No abnormality visualized. Pancreas: Multiple cystic lesions are seen in the pancreas with the largest measuring 4.4 cm. Complex abnormality seen in the pancreatic head which may represent malignancy. Multiple stones are noted in pancreatic duct. Spleen: Size and appearance within normal limits. Right Kidney: Length: 9 cm. Increased echogenicity of renal parenchyma is noted. 2.7 cm cyst is noted in upper pole. 1.9 cm complex cyst is seen in lower pole. No mass or hydronephrosis visualized. Left Kidney: Length:  9.3 cm. Increased echogenicity of left renal parenchyma is noted. Multiple simple cysts are noted. No mass or hydronephrosis visualized. Abdominal aorta: No aneurysm visualized. Other findings: None. IMPRESSION: Distended gallbladder with sludge, but no cholelithiasis. Severe intrahepatic and extrahepatic biliary dilatation is noted, consistent with history of pancreatic neoplasm. Multiple cystic lesions are noted in the pancreatic head with the largest measuring 4.4 cm. Complex abnormality is noted in pancreatic head which may represent malignancy. Multiple stones are noted in the pancreatic duct. Multiple cystic lesions are seen throughout the liver which may represent metastatic disease. Probable 1 cm predominantly solid right hepatic abnormality is noted concerning for metastatic disease. Increased echogenicity of renal parenchyma is noted bilaterally consistent with medical renal disease. Electronically Signed   By: Marijo Conception, M.D.   On: 09/03/2018 14:10   US Renal  Result Date: 09/24/2018 CLINICAL DATA:  Renal failure. EXAM: RENAL / URINARY TRACT ULTRASOUND COMPLETE COMPARISON:  MRI of 09/17/2018. FINDINGS: Right Kidney: Renal measurements: 10.14.0 x 5.4 cm = volume: 1011.8 mL. Increased echogenicity. 2.3 cm simple cyst. 1.8 cm simple cyst. No hydronephrosis visualized. Left Kidney: Renal measurements: 9.5 x 4.5 x 5.1 cm = volume: 1012.4 mL. Increased echogenicity. 20.7 cm simple cyst. No hydronephrosis. Visualized. Bladder: Small amount of debris noted the bladder. Bladder wall thickening can not be excluded. Bladder pathology can not be excluded. Mild ascites noted. IMPRESSION: 1. Increased renal echogenicity consistent with chronic medical renal  disease. Simple bilateral renal cysts noted as above. 2. Small amount of debris noted the bladder. Bladder wall thickening can not be excluded. Better pathology can not be excluded. 3.  Mild ascites. Electronically Signed   By: Marcello Moores  Register   On:  09/24/2018 12:47   Mr 3d Recon At Scanner  Result Date: 09/17/2018 CLINICAL DATA:  Metastatic pancreatic adenocarcinoma to the liver. Jaundice. EXAM: MRI ABDOMEN WITHOUT AND WITH CONTRAST (INCLUDING MRCP) TECHNIQUE: Multiplanar multisequence MR imaging of the abdomen was performed both before and after the administration of intravenous contrast. Heavily T2-weighted images of the biliary and pancreatic ducts were obtained, and three-dimensional MRCP images were rendered by post processing. CONTRAST:  3 cc Gadavist COMPARISON:  CT scan 07/10/2018 FINDINGS: Lower chest: Unremarkable. Hepatobiliary: Innumerable cystic lesions are identified in the liver, as before. Index posterior right lesion measured previously at 18 mm is 19 mm today. There is fairly marked intra and extrahepatic biliary duct dilatation, progressive in the interval. Common duct measures up to 17 mm diameter and appears compressed by new cystic disease in the head and body of pancreas. Pancreas: Marked progression of cystic disease in the head and body of pancreas with a cystic conglomeration in this region now measuring 10.2 x 4.6 x 6.8 cm. There is massive dilatation of the pancreatic duct in the tail of pancreas with overlying parenchymal atrophy. Spleen:  No splenomegaly. No focal mass lesion. Adrenals/Urinary Tract: No adrenal nodule or mass. Right renal cysts again noted. Tiny cysts are noted in the left kidney Stomach/Bowel: Stomach is unremarkable. No gastric wall thickening. No evidence of outlet obstruction. Cystic disease in the pancreas generates marked mass-effect on the body and antrum of the stomach. Duodenum is normally positioned as is the ligament of Treitz. No small bowel or colonic dilatation within the abdomen. Vascular/Lymphatic: No abdominal aortic aneurysm. No discrete lymphadenopathy evident although there is diffuse abnormal enhancing soft tissue in the central mesentery around the SMA. Portal vein is attenuated but  appears patent. Patency of the SMV can not be confirmed. Splenic vein is patent. Other:  Cachexia with diffuse soft tissue edema. Musculoskeletal: No abnormal marrow enhancement within the visualized bony anatomy. IMPRESSION: 1. Interval development of a large conglomeration of cystic disease in the head and body of pancreas measuring 10 x 5 x 7 cm. The common bile duct is markedly dilated and then tapers as it tracks past this new cystic involvement. Mass-effect by the cysts may be the etiology for the biliary obstruction. Distal common bile duct into the ampulla is not visualized on this study. 2. Similar appearance of liver disease. 3. Portal vein and splenic vein are patent. Patency of the superior mesenteric vein can not be confirmed on this study and the vessel may be markedly attenuated or occluded in the region of the portal splenic confluence. Electronically Signed   By: Misty Stanley M.D.   On: 09/17/2018 14:59   Dg C-arm 1-60 Min-no Report  Result Date: 09/26/2018 Fluoroscopy was utilized by the requesting physician.  No radiographic interpretation.   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 Jaundice. EXAM: MRI ABDOMEN WITHOUT AND WITH CONTRAST (INCLUDING MRCP) TECHNIQUE: Multiplanar multisequence MR imaging of the abdomen was performed both before and after the administration of intravenous contrast. Heavily T2-weighted images of the biliary and pancreatic ducts were obtained, and three-dimensional MRCP images were rendered by post processing. CONTRAST:  3 cc Gadavist COMPARISON:  CT scan  07/10/2018 FINDINGS: Lower chest: Unremarkable. Hepatobiliary: Innumerable cystic lesions are identified in the liver, as before. Index posterior right lesion measured previously at 18 mm is 19 mm today. There is fairly marked intra and extrahepatic biliary duct dilatation, progressive in the interval. Common duct measures up to 17 mm  diameter and appears compressed by new cystic disease in the head and body of pancreas. Pancreas: Marked progression of cystic disease in the head and body of pancreas with a cystic conglomeration in this region now measuring 10.2 x 4.6 x 6.8 cm. There is massive dilatation of the pancreatic duct in the tail of pancreas with overlying parenchymal atrophy. Spleen:  No splenomegaly. No focal mass lesion. Adrenals/Urinary Tract: No adrenal nodule or mass. Right renal cysts again noted. Tiny cysts are noted in the left kidney Stomach/Bowel: Stomach is unremarkable. No gastric wall thickening. No evidence of outlet obstruction. Cystic disease in the pancreas generates marked mass-effect on the body and antrum of the stomach. Duodenum is normally positioned as is the ligament of Treitz. No small bowel or colonic dilatation within the abdomen. Vascular/Lymphatic: No abdominal aortic aneurysm. No discrete lymphadenopathy evident although there is diffuse abnormal enhancing soft tissue in the central mesentery around the SMA. Portal vein is attenuated but appears patent. Patency of the SMV can not be confirmed. Splenic vein is patent. Other:  Cachexia with diffuse soft tissue edema. Musculoskeletal: No abnormal marrow enhancement within the visualized bony anatomy. IMPRESSION: 1. Interval development of a large conglomeration of cystic disease in the head and body of pancreas measuring 10 x 5 x 7 cm. The common bile duct is markedly dilated and then tapers as it tracks past this new cystic involvement. Mass-effect by the cysts may be the etiology for the biliary obstruction. Distal common bile duct into the ampulla is not visualized on this study. 2. Similar appearance of liver disease. 3. Portal vein and splenic vein are patent. Patency of the superior mesenteric vein can not be confirmed on this study and the vessel may be markedly attenuated or occluded in the region of the portal splenic confluence. Electronically Signed    By: Misty Stanley M.D.   On: 09/17/2018 14:59     Subjective: Appears weak and frail.  Poor desire to eat.  Denies any other complaints.   Discharge Exam: Vitals:   09/27/18 2200 09/28/18 0500  BP: 104/63 120/66  Pulse: 75 70  Resp: 16 16  Temp: 98 F (36.7 C) 97.8 F (36.6 C)  SpO2: 99% 100%   Vitals:   09/27/18 0538 09/27/18 1255 09/27/18 2200 09/28/18 0500  BP: 98/64 106/71 104/63 120/66  Pulse: (!) 51 66 75 70  Resp: 16  16 16   Temp: 98 F (36.7 C) 97.9 F (36.6 C) 98 F (36.7 C) 97.8 F (36.6 C)  TempSrc: Oral Axillary Oral Oral  SpO2: 98% 99% 99% 100%  Weight:      Height:        General: Pt is alert, awake, not in acute distress, bilateral temporal wasting.  Cachectic frail. Cardiovascular: RRR, S1/S2 +, no rubs, no gallops Respiratory: CTA bilaterally, no wheezing, no rhonchi Abdominal: Soft, NT, ND, bowel sounds + Extremities: no edema, no cyanosis    The results of significant diagnostics from this hospitalization (including imaging, microbiology, ancillary and laboratory) are listed below for reference.     Microbiology: No results found for this or any previous visit (from the past 240 hour(s)).   Labs: BNP (last 3 results) No results  for input(s): BNP in the last 8760 hours. Basic Metabolic Panel: Recent Labs  Lab 09/23/18 0839 09/24/18 0610 09/26/18 0507 09/27/18 0531 09/28/18 0545  NA 144 142 140 139 141  K 3.5 3.2* 3.1* 3.8 3.1*  CL 119* 116* 115* 115* 118*  CO2 19* 20* 21* 21* 19*  GLUCOSE 79 80 98 85 93  BUN 28* 34* 16 22 20   CREATININE 0.93 0.75 0.56 0.57 0.68  CALCIUM 8.4* 7.8* 7.7* 7.9* 7.9*  MG  --   --  2.0 2.0 2.0   Liver Function Tests: Recent Labs  Lab 09/23/18 0839 09/24/18 0610 09/26/18 0507 09/27/18 0531 09/28/18 0545  AST 85* 82* 64* 64* 45*  ALT 78* 71* 58* 55* 53*  ALKPHOS 1,382* 1,082* 950* 939* 875*  BILITOT 9.8* 8.6* 7.0* 7.1* 6.1*  PROT 6.6 6.4* 5.9* 5.9* 5.8*  ALBUMIN 2.2* 2.2* 2.1* 2.1* 2.1*    No results for input(s): LIPASE, AMYLASE in the last 168 hours. No results for input(s): AMMONIA in the last 168 hours. CBC: Recent Labs  Lab 09/23/18 0839 09/24/18 0610 09/26/18 0507 09/27/18 0531 09/28/18 0545  WBC 10.1 7.6 6.9 7.6 5.9  NEUTROABS 9.0*  --   --   --   --   HGB 7.0* 10.5* 9.8* 10.4* 10.3*  HCT 19.8* 30.9* 29.4* 31.7* 32.0*  MCV 103.1* 94.8 99.0 100.0 103.6*  PLT 192 155 125* 140* 138*   Cardiac Enzymes: No results for input(s): CKTOTAL, CKMB, CKMBINDEX, TROPONINI in the last 168 hours. BNP: Invalid input(s): POCBNP CBG: No results for input(s): GLUCAP in the last 168 hours. D-Dimer No results for input(s): DDIMER in the last 72 hours. Hgb A1c No results for input(s): HGBA1C in the last 72 hours. Lipid Profile No results for input(s): CHOL, HDL, LDLCALC, TRIG, CHOLHDL, LDLDIRECT in the last 72 hours. Thyroid function studies No results for input(s): TSH, T4TOTAL, T3FREE, THYROIDAB in the last 72 hours.  Invalid input(s): FREET3 Anemia work up No results for input(s): VITAMINB12, FOLATE, FERRITIN, TIBC, IRON, RETICCTPCT in the last 72 hours. Urinalysis No results found for: COLORURINE, APPEARANCEUR, LABSPEC, [place], GLUCOSEU, HGBUR, BILIRUBINUR, KETONESUR, PROTEINUR, UROBILINOGEN, NITRITE, LEUKOCYTESUR Sepsis Labs Invalid input(s): PROCALCITONIN,  WBC,  LACTICIDVEN Microbiology No results found for this or any previous visit (from the past 240 hour(s)).   Time coordinating discharge:  I have spent 35 minutes face to face with the patient and on the ward discussing the patients care, assessment, plan and disposition with other care givers. >50% of the time was devoted counseling the patient about the risks and benefits of treatment/Discharge disposition and coordinating care.   SIGNED:   Damita Lack, MD  [facility] 09/28/2018, 12:01 PM   If 7PM-7AM, please contact night-coverage [email or web address]

## 2018-09-28 NOTE — Progress Notes (Signed)
OT Cancellation Note  Patient Details Name: Tammy Adams MRN: [id number] DOB: 01/16/1951   Cancelled Treatment:    Reason Eval/Treat Not Completed: Other (comment).  Noted pt comfort care. Will sign off.  [place] 09/28/2018, 7:16 AM  Lesle Chris, OTR/L Acute Rehabilitation Services [phone] [facility] pager [phone] office 09/28/2018

## 2018-09-30 ENCOUNTER — Inpatient Hospital Stay: Payer: Medicare Other

## 2018-09-30 ENCOUNTER — Inpatient Hospital Stay: Payer: Medicare Other | Admitting: Hematology

## 2018-10-01 ENCOUNTER — Telehealth: Payer: Self-pay

## 2018-10-01 NOTE — Telephone Encounter (Signed)
Faxed signed orders back to Hospice, sent to HIM for scanning to chart.  

## 2018-10-03 ENCOUNTER — Telehealth: Payer: Self-pay | Admitting: Hematology

## 2018-10-03 ENCOUNTER — Telehealth: Payer: Self-pay

## 2018-10-03 NOTE — Telephone Encounter (Signed)
Patient's husband called, concerned about patient is not eating much food.  Tammy Adams is currently at the [facility], no pain, but feels very weak, has not eating much, still able to drink fluids, such as tea, and boost.  I reassured Mr. Wiedeman that people with his terminal metastatic cancer are usually not hungry, he does not have to push her to eat, but encourage her to drinks fluids. He voiced good understanding and appreciated call.  Truitt Merle  10/03/2018

## 2018-10-03 NOTE — Telephone Encounter (Signed)
Patient's husband Ronalee Belts calls requesting to speak to Dr. Burr Medico about his wife's condition.  His number is [phone]

## 2018-10-08 ENCOUNTER — Encounter: Payer: Self-pay | Admitting: Hematology

## 2018-10-08 ENCOUNTER — Telehealth: Payer: Self-pay

## 2018-10-20 NOTE — Telephone Encounter (Signed)
Received notification from [facility] that patient passed away this morning, Dr. Burr Medico made aware.

## 2018-10-20 DEATH — deceased

## 2019-04-22 IMAGING — US US BIOPSY CORE LIVER
1 series · 13 of 15 positions shown · non-contrast
Comparison: CT of the chest, abdomen and pelvis - 06/21/2017

INDICATION: Concern for metastatic pancreatic cancer, now with multiple liver
lesions. Please perform ultrasound-guided liver lesion biopsy for
tissue diagnostic purposes.

EXAM:
ULTRASOUND GUIDED LIVER LESION BIOPSY

[Series 1: us biopsy core liver · 0.14mm/px · 15 acquisitions, 13 frames shown]
[im 1/15]
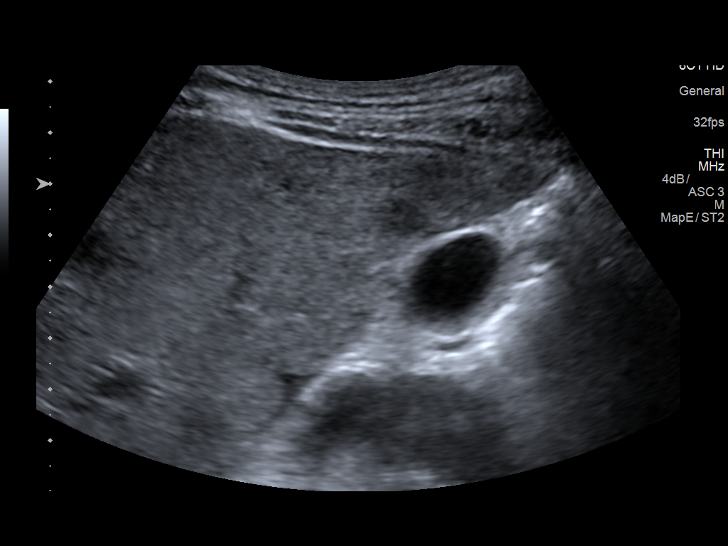
[im 2/15]
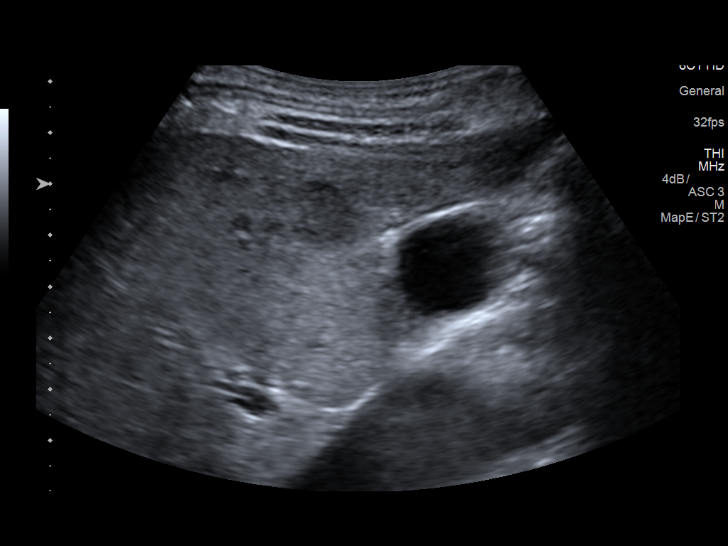
[im 3/15]
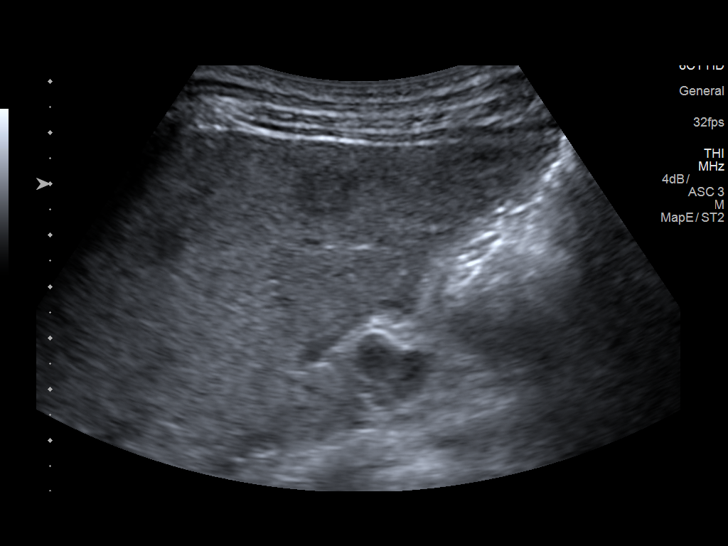
[im 5/15]
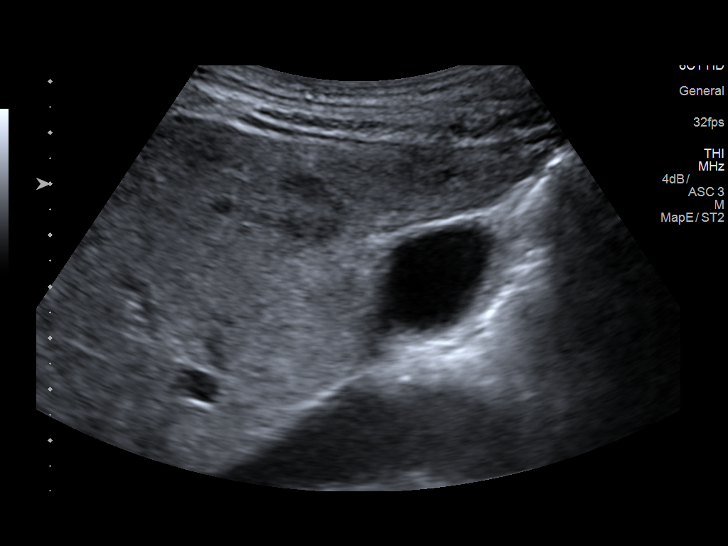
[im 6/15]
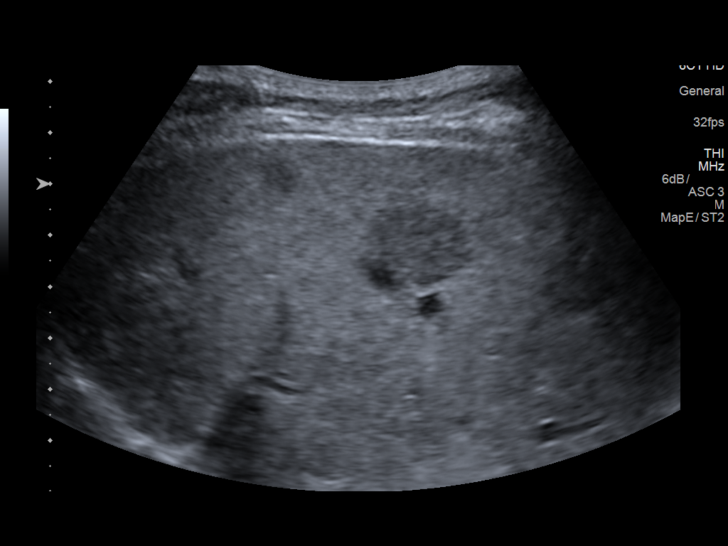
[im 7/15]
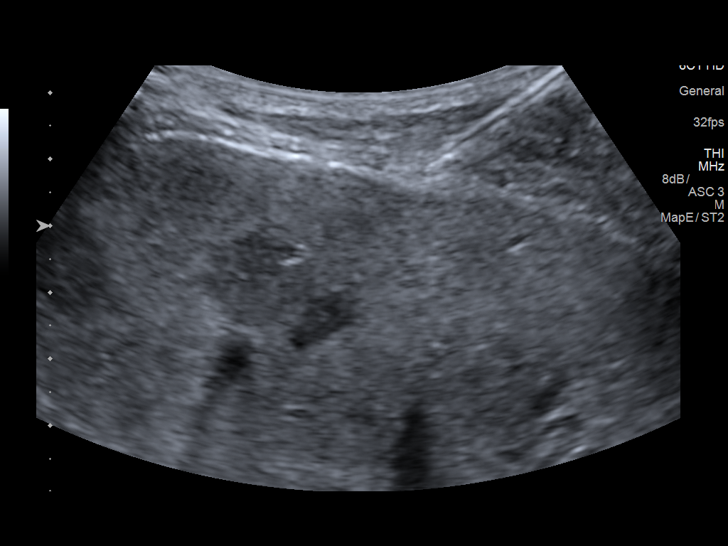
[im 8/15]
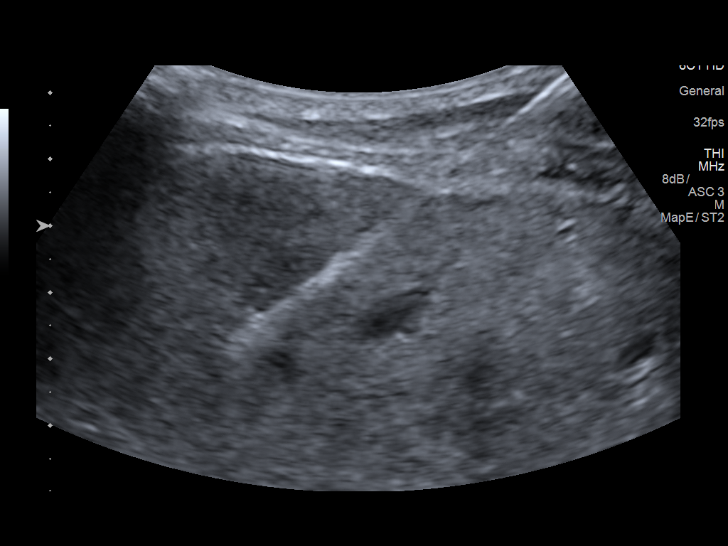
[im 9/15]
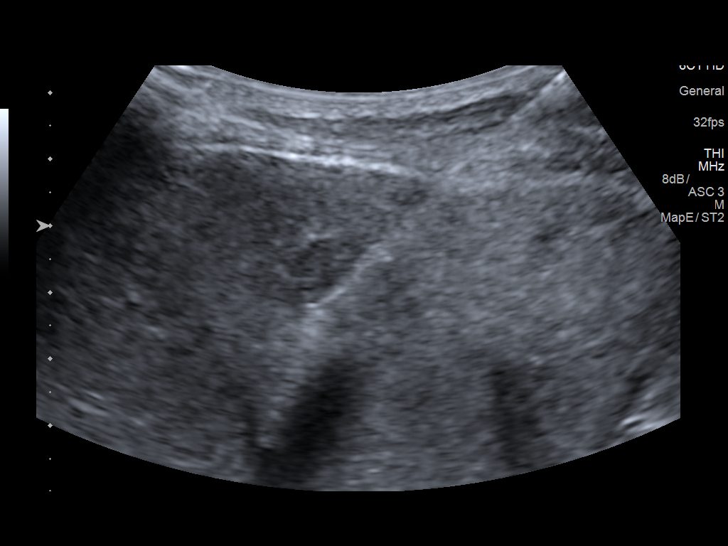
[im 10/15]
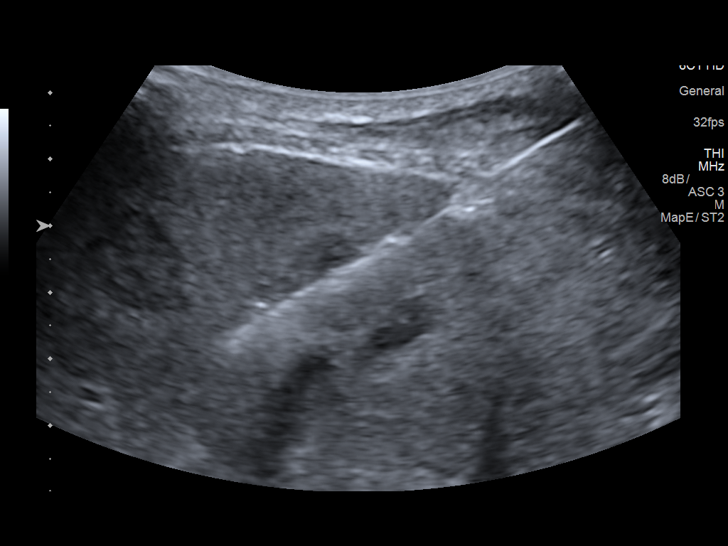
[im 11/15]
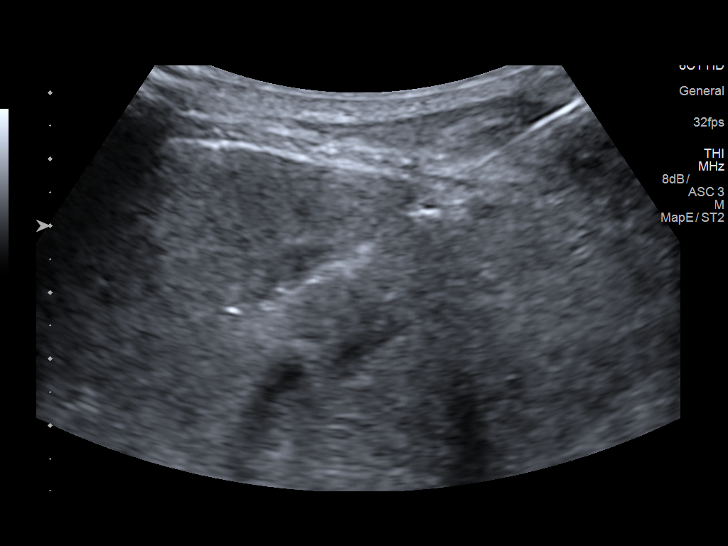
[im 13/15]
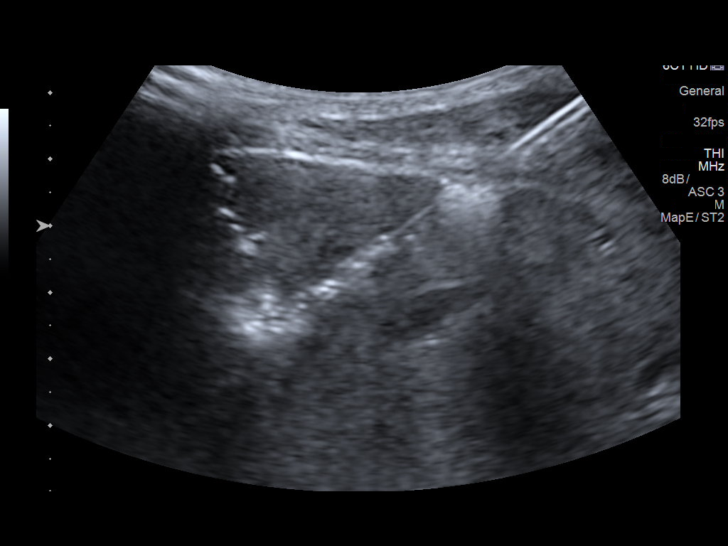
[im 14/15]
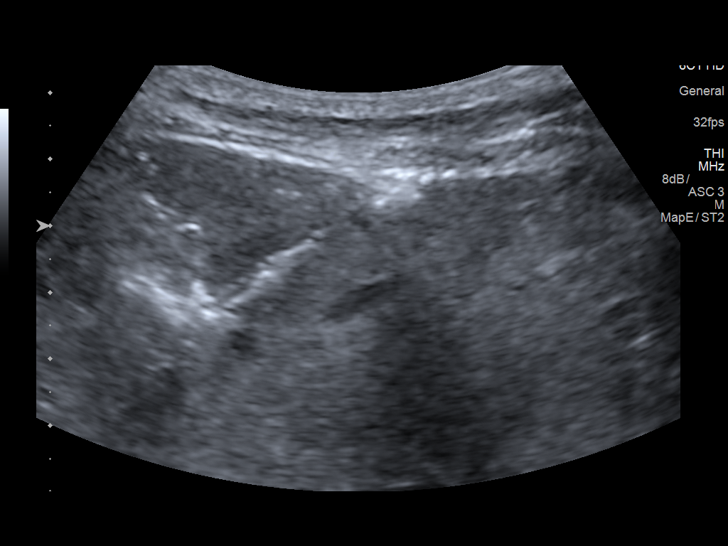
[im 15/15]
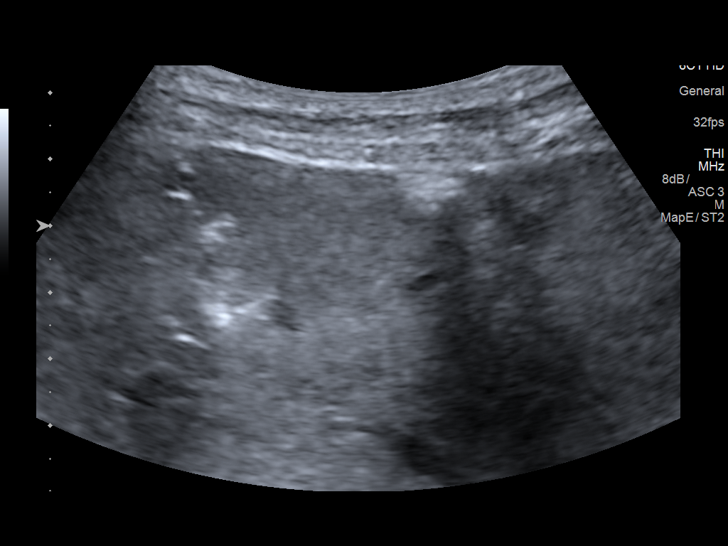

[13 of 15 positions shown; findings below may reference images not displayed]

MEDICATIONS:
None

ANESTHESIA/SEDATION:
Fentanyl 50 mcg IV; Versed 1 mg IV

Total Moderate Sedation time: 15 minutes.

The patient's level of consciousness and vital signs were monitored
continuously by radiology nursing throughout the procedure under my
direct supervision.

COMPLICATIONS:
None immediate.

PROCEDURE:
Informed written consent was obtained from the patient after a
discussion of the risks, benefits and alternatives to treatment. The
patient understands and consents the procedure. A timeout was
performed prior to the initiation of the procedure.

Ultrasound scanning was performed of the right upper abdominal
quadrant demonstrates an approximately 1.8 x 2.2 cm lesion within
the peripheral aspect the right lobe of the liver, likely
correlating with the hypoattenuating lesion seen on preceding
abdominal CT image 98, series 2). The procedure was planned.

The right upper abdominal quadrant was prepped and draped in the
usual sterile fashion. The overlying soft tissues were anesthetized
with 1% lidocaine with epinephrine. A 17 gauge, 6.8 cm co-axial
needle was advanced into a peripheral aspect of the lesion. This was
followed by 5 core biopsies with an 18 gauge core device under
direct ultrasound guidance.

The coaxial needle tract was embolized with a small amount of
Gel-Foam slurry and superficial hemostasis was obtained with manual
compression. Post procedural scanning was negative for definitive
area of hemorrhage or additional complication. A dressing was
placed. The patient tolerated the procedure well without immediate
post procedural complication.
IMPRESSION: Technically successful ultrasound guided core needle biopsy of
indeterminate lesion within the peripheral aspect of the right lobe
of the liver.

## 2020-05-26 IMAGING — MR MR 3D RECON AT SCANNER
19 of 21 series · 19 of 21 positions shown · IV contrast (gadavist)
Comparison: CT scan 07/10/2018

CLINICAL DATA: Metastatic pancreatic adenocarcinoma to the liver.
Jaundice.

EXAM:
MRI ABDOMEN WITHOUT AND WITH CONTRAST (INCLUDING MRCP)
TECHNIQUE: Multiplanar multisequence MR imaging of the abdomen was performed
both before and after the administration of intravenous contrast.
Heavily T2-weighted images of the biliary and pancreatic ducts were
obtained, and three-dimensional MRCP images were rendered by post
processing.
CONTRAST:  3 cc Gadavist

[Series 4: T2 fat-sat · axial · 5.0mm · 0.86mm/px · 1 of 54 slices shown]
[im 1/54]
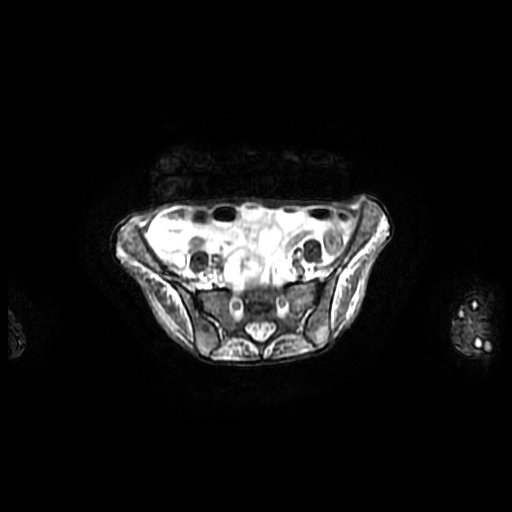

[Series 5: MRCP · coronal · 1.6mm · 0.62mm/px · 1 of 145 slices shown (1 of 3)]
[im 1/145]
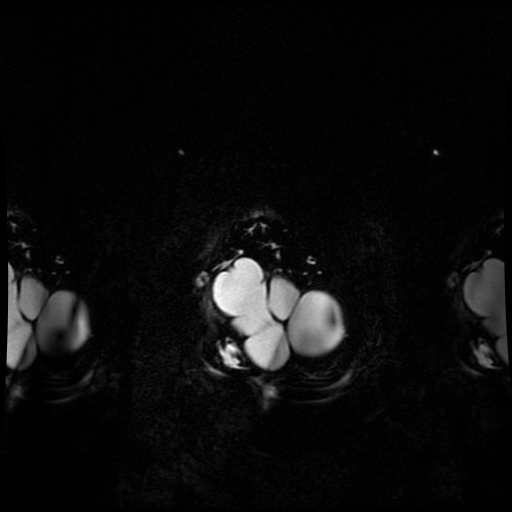

[Series 6: MRCP · coronal · 2.0mm · 0.70mm/px · 1 of 55 slices shown (2 of 3)]
[im 1/55]
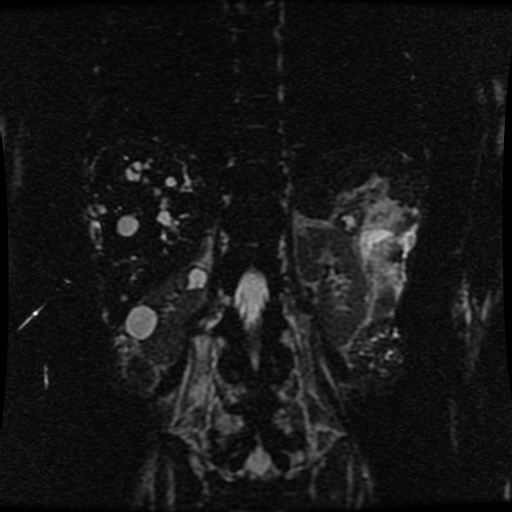

[Series 7: DWI b500 · axial · 6.0mm · 1.76mm/px · 1 of 71 slices shown]
[im 1/71]
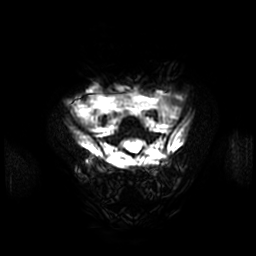

[Series 8: bSSFP fat-sat · coronal · 5.0mm · 0.78mm/px · 1 of 42 slices shown]
[im 1/42]
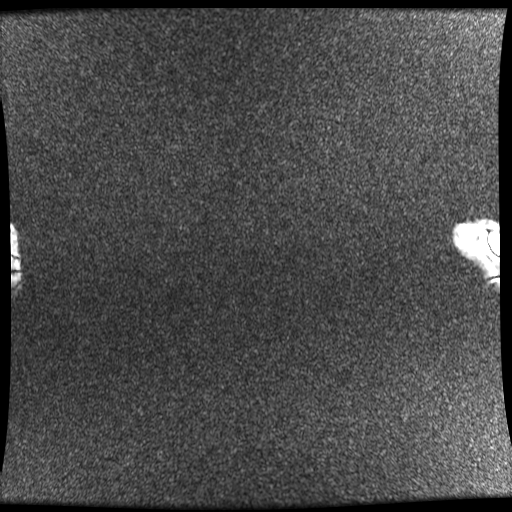

[Series 10: MRCP · coronal · 40.0mm · 0.70mm/px · 1 of 9 slices shown (3 of 3)]
[im 1/9]
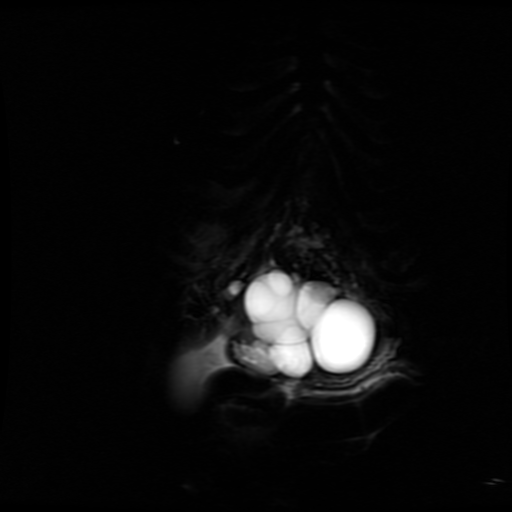

[Series 13: ax dualecho bh · axial · 5.0mm · 0.86mm/px · 1 of 108 slices shown]
[im 1/108]
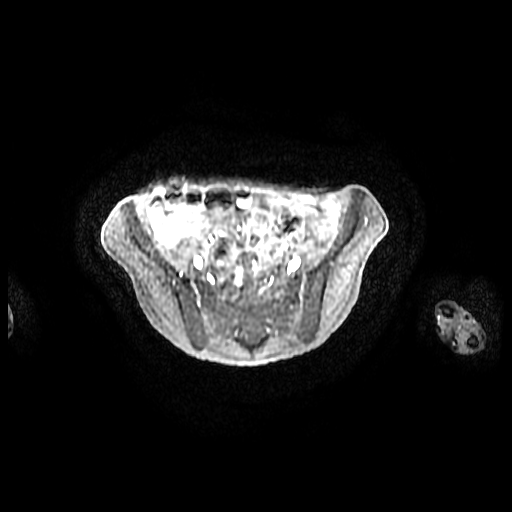

[Series 15: T1 dynamic · coronal · delayed · 5.0mm · 0.78mm/px · 1 of 64 slices shown (1 of 6)]
[im 1/64]
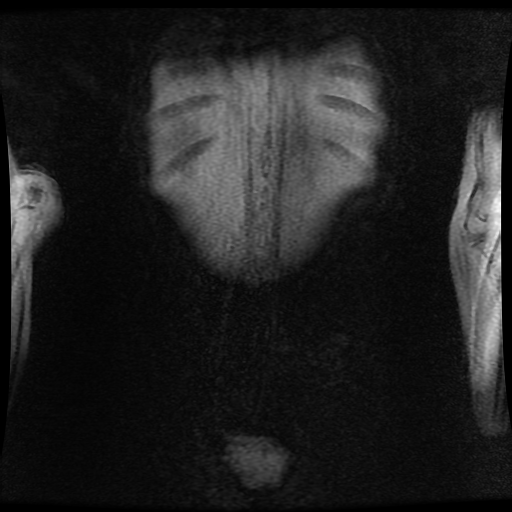

[Series 700: DWI · axial · 6.0mm · 1.76mm/px · 1 of 36 slices shown]
[im 1/36]
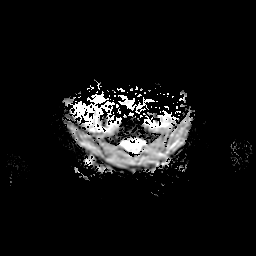

[Series 1400: T1 dynamic · axial · 6.0mm · 0.78mm/px · 1 of 88 slices shown (2 of 6)]
[im 1/88]
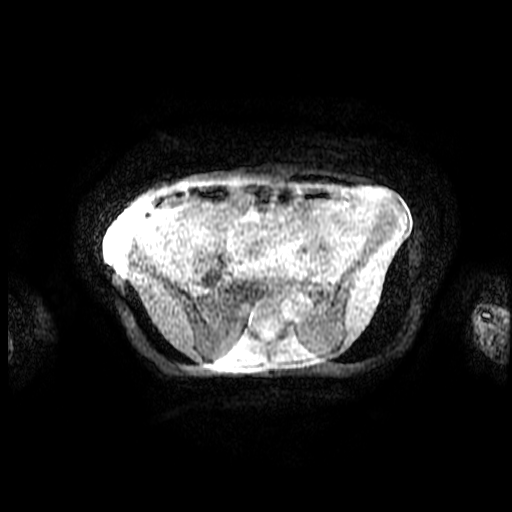

[Series 1401: T1 dynamic · axial · 6.0mm · 0.78mm/px · 1 of 88 slices shown (3 of 6)]
[im 1/88]
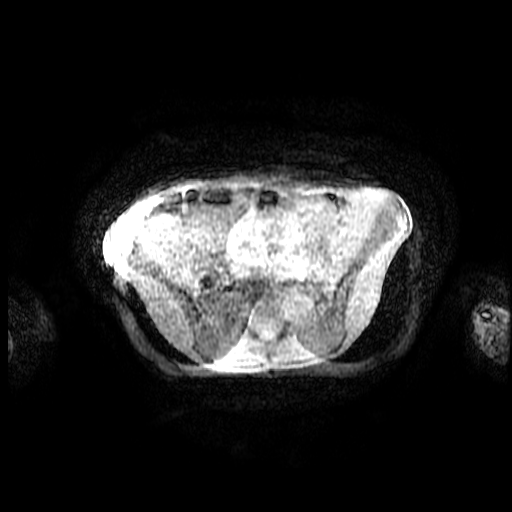

[Series 1402: T1 dynamic · axial · 6.0mm · 0.78mm/px · 1 of 88 slices shown (4 of 6)]
[im 1/88]
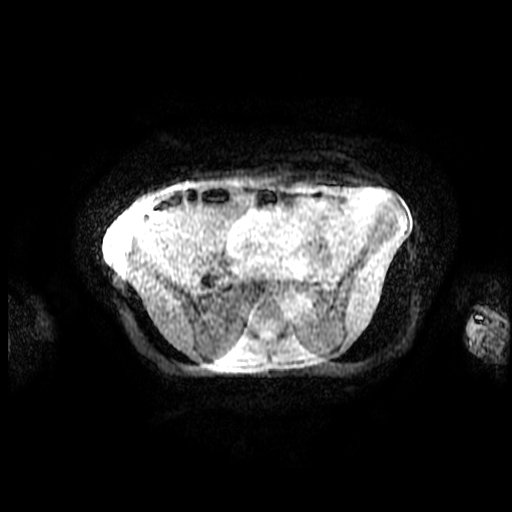

[Series 1403: T1 dynamic · axial · 6.0mm · 0.78mm/px · 1 of 88 slices shown (5 of 6)]
[im 1/88]
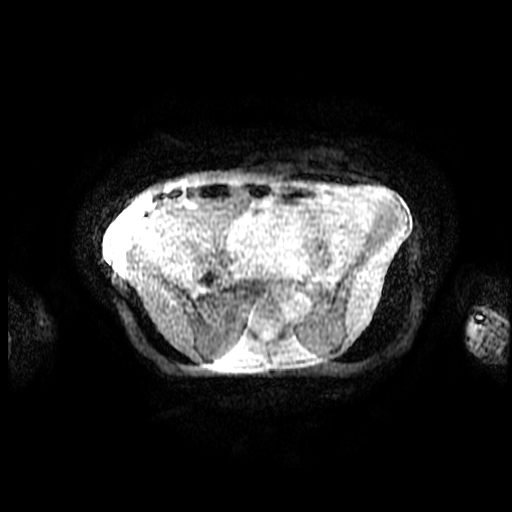

[Series 1404: T1 dynamic · axial · 6.0mm · 0.78mm/px · 1 of 88 slices shown (6 of 6)]
[im 1/88]
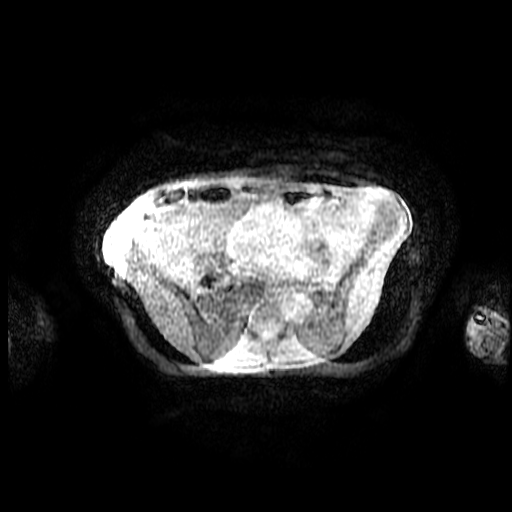

[((id)/(id)/1)-((id)/(id)/1) · axial · 6.0mm · 0.78mm/px · 1 of 88 slices shown (1 of 5)]
[im 1/88]
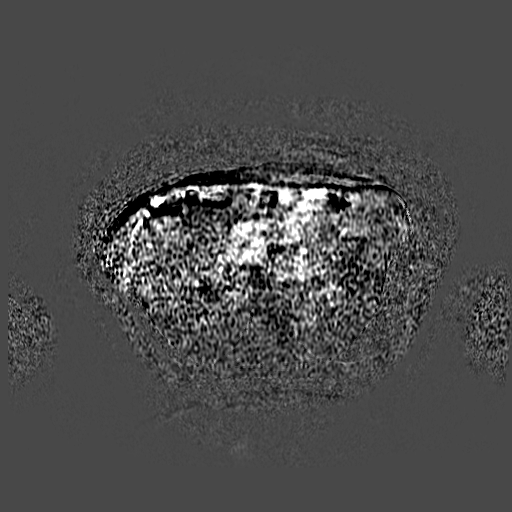

[((id)/(id)/1)-((id)/(id)/1) · axial · 6.0mm · 0.78mm/px · 1 of 88 slices shown (2 of 5)]
[im 1/88]
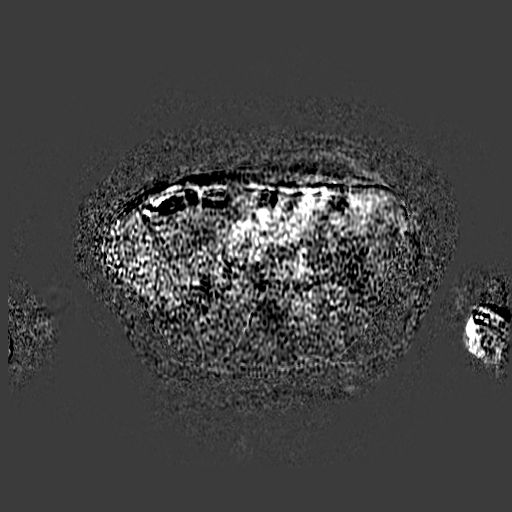

[((id)/(id)/1)-((id)/(id)/1) · axial · 6.0mm · 0.78mm/px · 1 of 88 slices shown (3 of 5)]
[im 1/88]
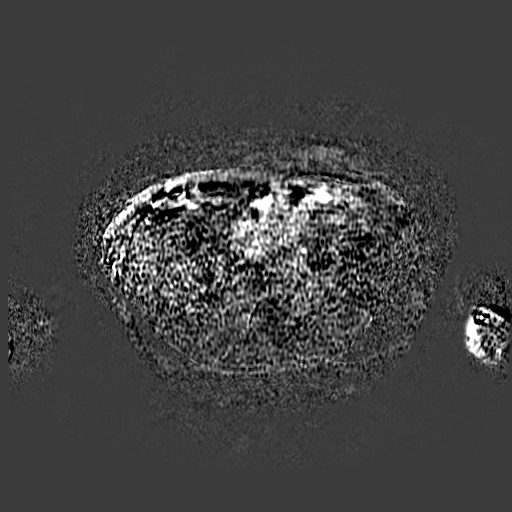

[((id)/(id)/1)-((id)/(id)/1) · axial · 6.0mm · 0.78mm/px · 1 of 88 slices shown (4 of 5)]
[im 1/88]
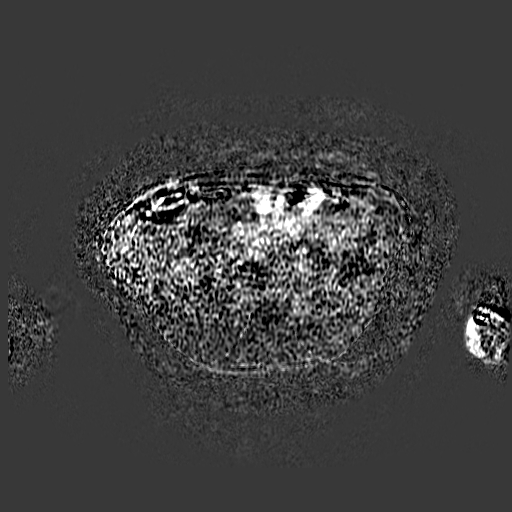

[((id)/(id)/1)-((id)/(id)/1) · axial · 6.0mm · 0.78mm/px · 1 of 88 slices shown (5 of 5)]
[im 1/88]
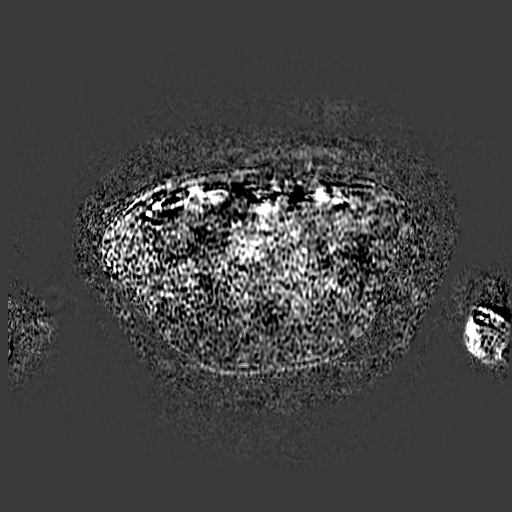

[19 of 21 positions shown; findings below may reference images not displayed]

FINDINGS: Lower chest: Unremarkable.

Hepatobiliary: Innumerable cystic lesions are identified in the
liver, as before. Index posterior right lesion measured previously
at 18 mm is 19 mm today. There is fairly marked intra and
extrahepatic biliary duct dilatation, progressive in the interval.
Common duct measures up to 17 mm diameter and appears compressed by
new cystic disease in the head and body of pancreas.

Pancreas: Marked progression of cystic disease in the head and body
of pancreas with a cystic conglomeration in this region now
measuring 10.2 x 4.6 x 6.8 cm. There is massive dilatation of the
pancreatic duct in the tail of pancreas with overlying parenchymal
atrophy.

Spleen:  No splenomegaly. No focal mass lesion.

Adrenals/Urinary Tract: No adrenal nodule or mass. Right renal cysts
again noted. Tiny cysts are noted in the left kidney

Stomach/Bowel: Stomach is unremarkable. No gastric wall thickening.
No evidence of outlet obstruction. Cystic disease in the pancreas
generates marked mass-effect on the body and antrum of the stomach.
Duodenum is normally positioned as is the ligament of Treitz. No
small bowel or colonic dilatation within the abdomen.

Vascular/Lymphatic: No abdominal aortic aneurysm. No discrete
lymphadenopathy evident although there is diffuse abnormal enhancing
soft tissue in the central mesentery around the SMA. Portal vein is
attenuated but appears patent. Patency of the SMV can not be
confirmed. Splenic vein is patent.

Other:  Cachexia with diffuse soft tissue edema.

Musculoskeletal: No abnormal marrow enhancement within the
visualized bony anatomy.
IMPRESSION: 1. Interval development of a large conglomeration of cystic disease
in the head and body of pancreas measuring 10 x 5 x 7 cm. The common
bile duct is markedly dilated and then tapers as it tracks past this
new cystic involvement. Mass-effect by the cysts may be the etiology
for the biliary obstruction. Distal common bile duct into the
ampulla is not visualized on this study.
2. Similar appearance of liver disease.
3. Portal vein and splenic vein are patent. Patency of the superior
mesenteric vein can not be confirmed on this study and the vessel
may be markedly attenuated or occluded in the region of the portal
splenic confluence.

## 2020-06-19 IMAGING — US US ABDOMEN COMPLETE
1 series · 13 of 25 positions shown · non-contrast
Comparison: CT scan of July 10, 2018.

CLINICAL DATA: Pancreatic cancer metastasized to the liver.

EXAM:
ABDOMEN ULTRASOUND COMPLETE

[Series 1: us abdomen complete · 0.19mm/px · 13 of 142 slices shown]
[im 1/142]
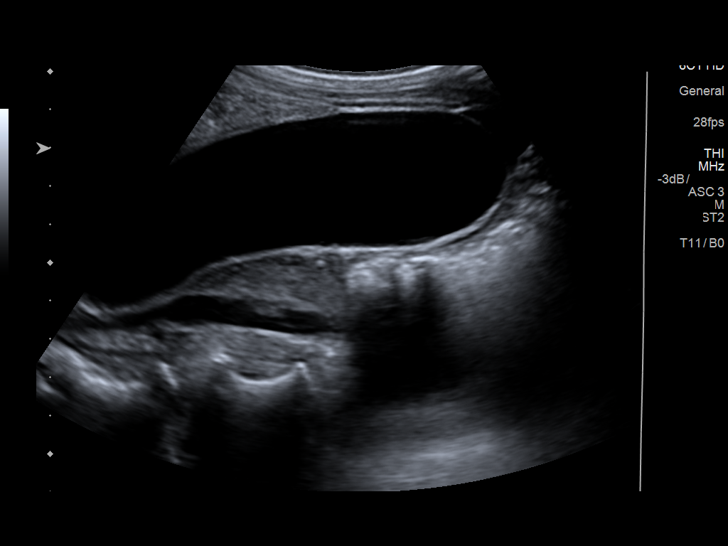
[im 12/142]
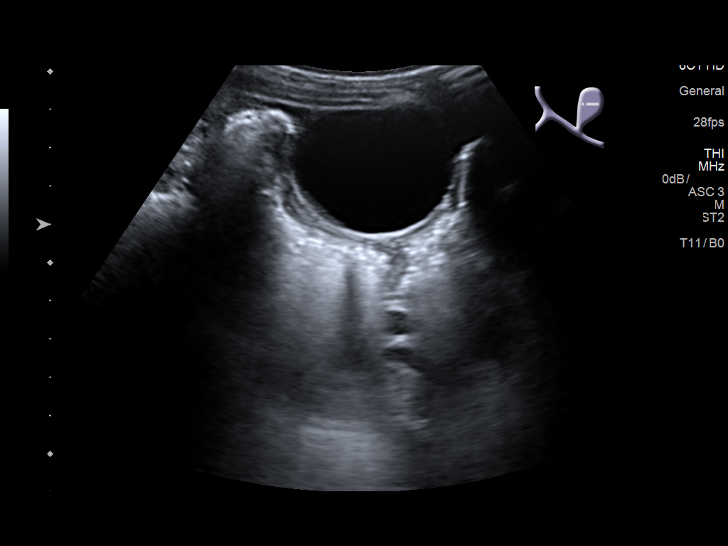
[im 24/142]
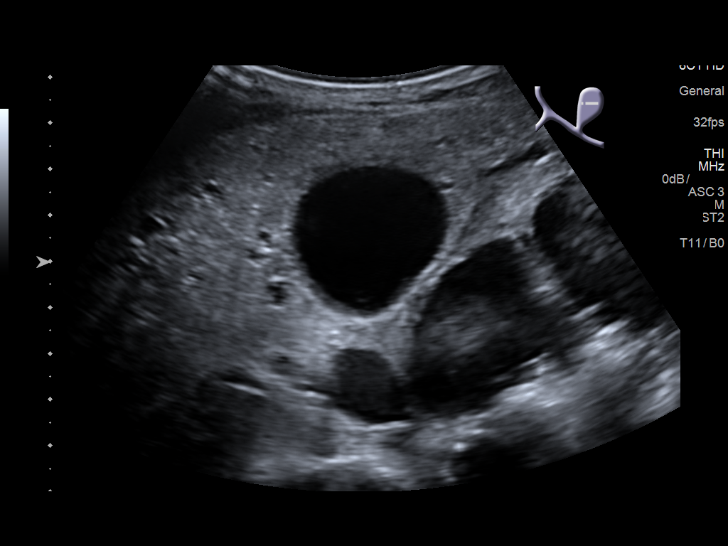
[im 36/142]
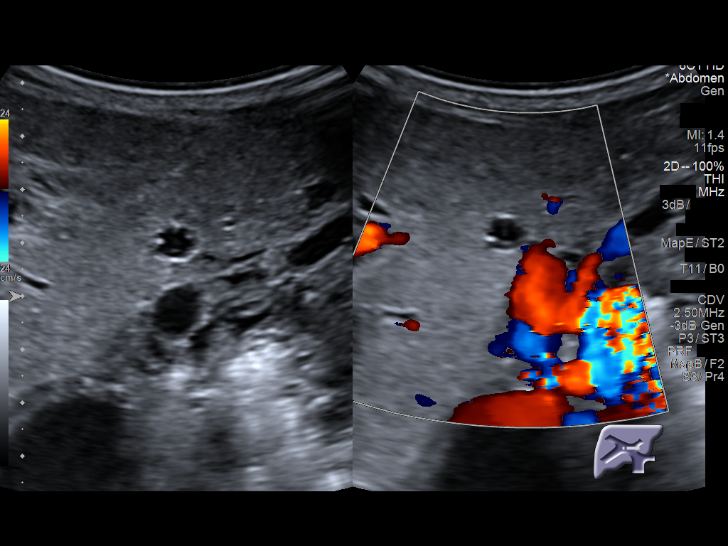
[im 48/142]
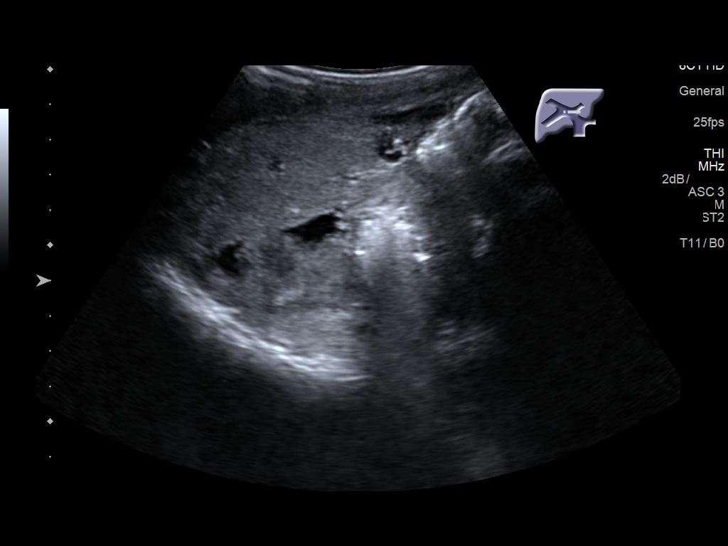
[im 59/142]
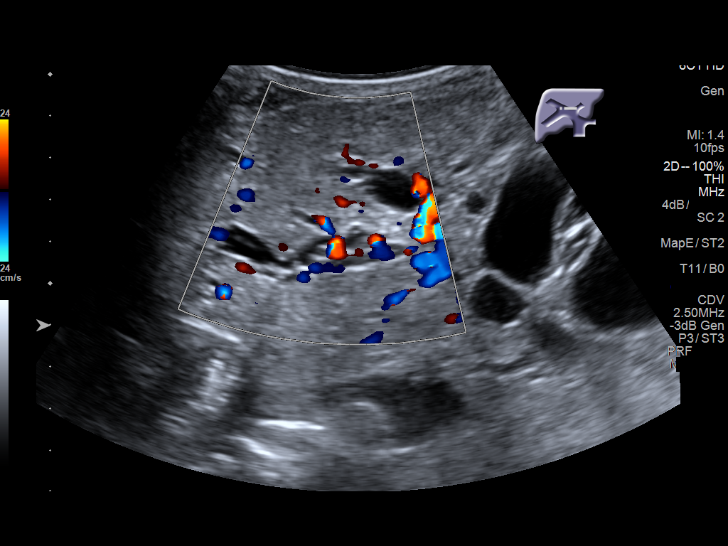
[im 71/142]
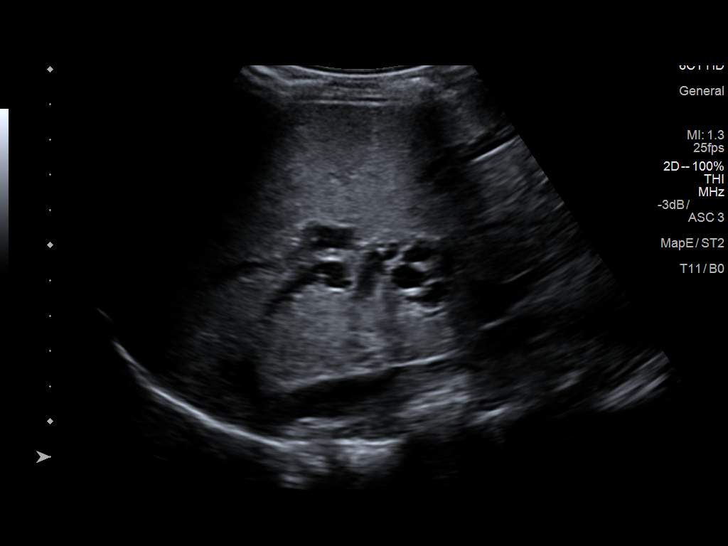
[im 83/142]
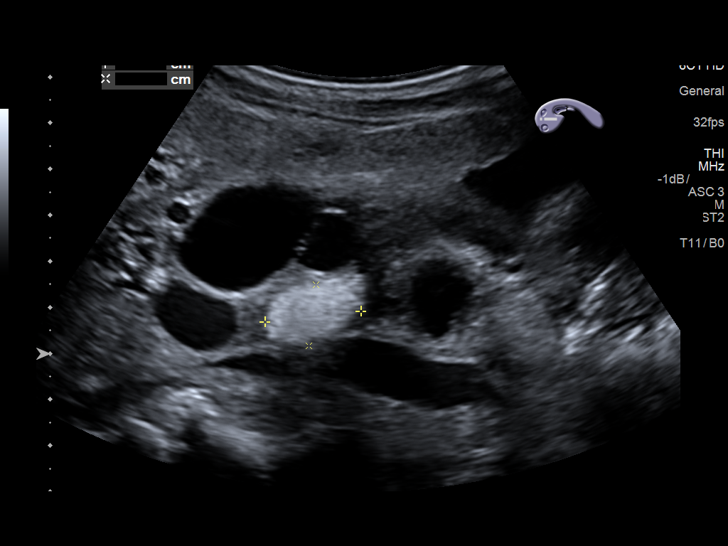
[im 95/142]
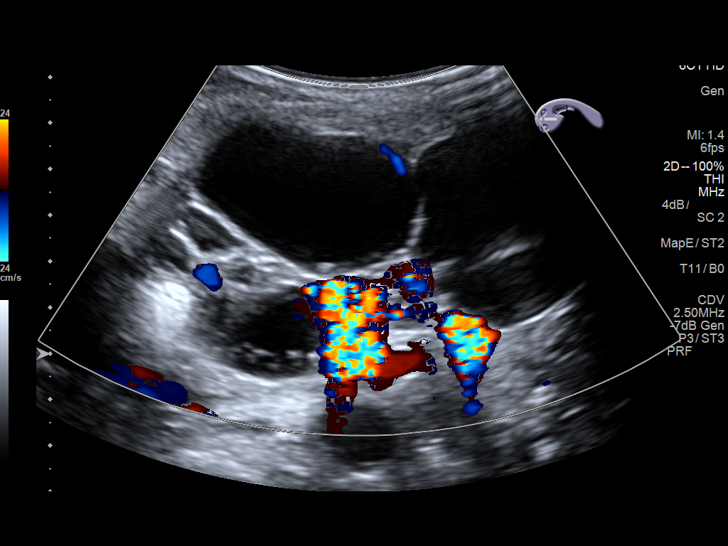
[im 106/142]
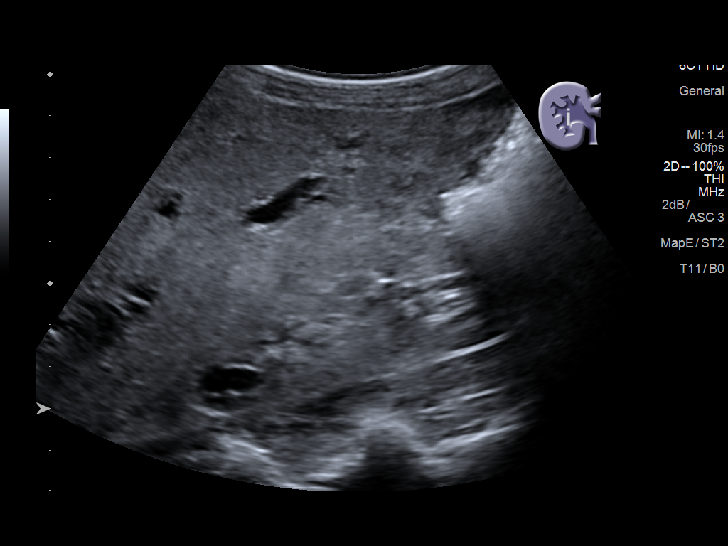
[im 118/142]
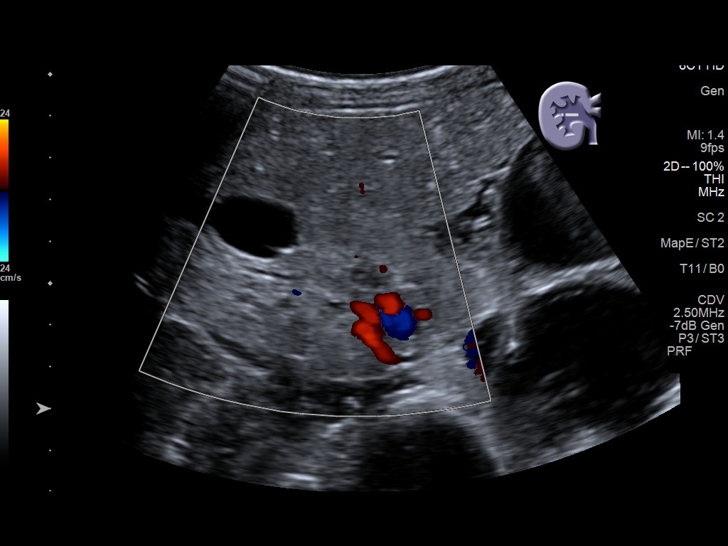
[im 130/142]
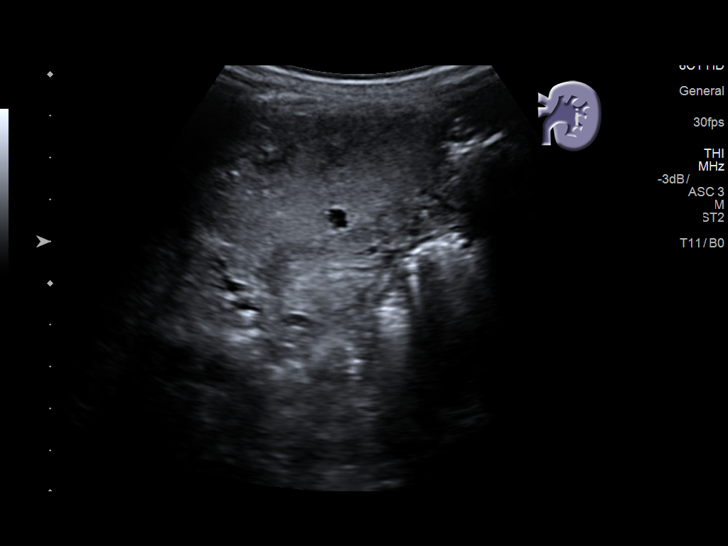
[im 142/142]
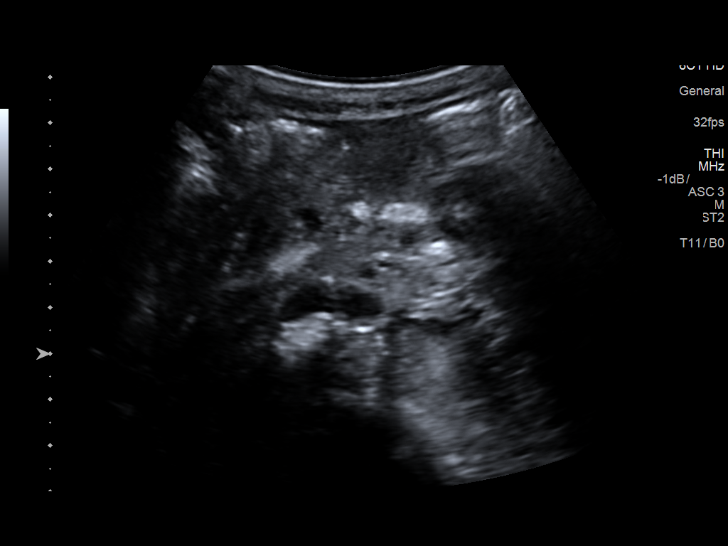

[13 of 25 positions shown; findings below may reference images not displayed]

FINDINGS: Gallbladder: Distended gallbladder is noted with sludge, but no
cholelithiasis. No gallbladder wall thickening or pericholecystic
fluid is noted. No sonographic Murphy's sign is noted.

Common bile duct: Diameter: 17 mm which is consistent with
significant dilatation.

Liver: 1 cm predominately solid abnormality is seen in right hepatic
lobe peripherally concerning for possible metastatic lesion. Severe
intrahepatic biliary dilatation is noted as well. Multiple cysts of
varying sizes are seen throughout hepatic parenchyma. Portal vein is
patent on color Doppler imaging with normal direction of blood flow
towards the liver.

IVC: No abnormality visualized.

Pancreas: Multiple cystic lesions are seen in the pancreas with the
largest measuring 4.4 cm. Complex abnormality seen in the pancreatic
head which may represent malignancy. Multiple stones are noted in
pancreatic duct.

Spleen: Size and appearance within normal limits.

Right Kidney: Length: 9 cm.. Increased echogenicity of renal
parenchyma is noted. 2.7 cm cyst is noted in upper pole. 1.9 cm
complex cyst is seen in lower pole.. No mass or hydronephrosis
visualized.

Left Kidney: Length: 9.3 cm. Increased echogenicity of left renal
parenchyma is noted. Multiple simple cysts are noted. No mass or
hydronephrosis visualized.

Abdominal aorta: No aneurysm visualized.

Other findings: None.
IMPRESSION: Distended gallbladder with sludge, but no cholelithiasis.

Severe intrahepatic and extrahepatic biliary dilatation is noted,
consistent with history of pancreatic neoplasm.

Multiple cystic lesions are noted in the pancreatic head with the
largest measuring 4.4 cm. Complex abnormality is noted in pancreatic
head which may represent malignancy. Multiple stones are noted in
the pancreatic duct.

Multiple cystic lesions are seen throughout the liver which may
represent metastatic disease. Probable 1 cm predominantly solid
right hepatic abnormality is noted concerning for metastatic
disease.

Increased echogenicity of renal parenchyma is noted bilaterally
consistent with medical renal disease.
